# Patient Record
Sex: Male | Born: 1965
Health system: Southern US, Community
[De-identification: ages and names within clinical notes are randomized; demographics above are authoritative.]

## PROBLEM LIST (undated history)

## (undated) DIAGNOSIS — N400 Enlarged prostate without lower urinary tract symptoms: Secondary | ICD-10-CM

## (undated) DIAGNOSIS — N200 Calculus of kidney: Secondary | ICD-10-CM

## (undated) DIAGNOSIS — T7840XA Allergy, unspecified, initial encounter: Secondary | ICD-10-CM

## (undated) DIAGNOSIS — J45909 Unspecified asthma, uncomplicated: Secondary | ICD-10-CM

## (undated) DIAGNOSIS — M1990 Unspecified osteoarthritis, unspecified site: Secondary | ICD-10-CM

## (undated) DIAGNOSIS — K219 Gastro-esophageal reflux disease without esophagitis: Secondary | ICD-10-CM

## (undated) DIAGNOSIS — I10 Essential (primary) hypertension: Secondary | ICD-10-CM

## (undated) DIAGNOSIS — E785 Hyperlipidemia, unspecified: Secondary | ICD-10-CM

## (undated) DIAGNOSIS — M722 Plantar fascial fibromatosis: Secondary | ICD-10-CM

## (undated) HISTORY — DX: Gastro-esophageal reflux disease without esophagitis: K21.9

## (undated) HISTORY — DX: Allergy, unspecified, initial encounter: T78.40XA

## (undated) HISTORY — PX: COLONOSCOPY: SHX174

## (undated) HISTORY — DX: Benign prostatic hyperplasia without lower urinary tract symptoms: N40.0

## (undated) HISTORY — PX: OTHER SURGICAL HISTORY: SHX169

## (undated) HISTORY — PX: TONSILLECTOMY: SUR1361

## (undated) HISTORY — PX: JOINT REPLACEMENT: SHX530

## (undated) HISTORY — DX: Hyperlipidemia, unspecified: E78.5

## (undated) HISTORY — DX: Essential (primary) hypertension: I10

## (undated) HISTORY — PX: EYE SURGERY: SHX253

## (undated) HISTORY — DX: Plantar fascial fibromatosis: M72.2

## (undated) HISTORY — DX: Unspecified asthma, uncomplicated: J45.909

---

## 1998-02-21 ENCOUNTER — Emergency Department (HOSPITAL_COMMUNITY): Admission: EM | Admit: 1998-02-21 | Discharge: 1998-02-21 | Payer: Self-pay | Admitting: Emergency Medicine

## 1999-07-25 ENCOUNTER — Emergency Department (HOSPITAL_COMMUNITY): Admission: EM | Admit: 1999-07-25 | Discharge: 1999-07-25 | Payer: Self-pay | Admitting: Emergency Medicine

## 2000-01-18 ENCOUNTER — Emergency Department (HOSPITAL_COMMUNITY): Admission: EM | Admit: 2000-01-18 | Discharge: 2000-01-18 | Payer: Self-pay | Admitting: Emergency Medicine

## 2000-03-10 ENCOUNTER — Emergency Department (HOSPITAL_COMMUNITY): Admission: EM | Admit: 2000-03-10 | Discharge: 2000-03-10 | Payer: Self-pay | Admitting: *Deleted

## 2000-06-22 ENCOUNTER — Encounter: Payer: Self-pay | Admitting: Internal Medicine

## 2000-06-22 ENCOUNTER — Ambulatory Visit (HOSPITAL_COMMUNITY): Admission: RE | Admit: 2000-06-22 | Discharge: 2000-06-22 | Payer: Self-pay | Admitting: Internal Medicine

## 2000-08-04 ENCOUNTER — Ambulatory Visit (HOSPITAL_COMMUNITY): Admission: RE | Admit: 2000-08-04 | Discharge: 2000-08-04 | Payer: Self-pay | Admitting: Neurosurgery

## 2000-08-04 ENCOUNTER — Encounter: Payer: Self-pay | Admitting: Neurosurgery

## 2000-08-18 ENCOUNTER — Ambulatory Visit (HOSPITAL_COMMUNITY): Admission: RE | Admit: 2000-08-18 | Discharge: 2000-08-18 | Payer: Self-pay | Admitting: Neurosurgery

## 2000-08-18 ENCOUNTER — Encounter: Payer: Self-pay | Admitting: Neurosurgery

## 2000-08-31 ENCOUNTER — Encounter: Payer: Self-pay | Admitting: Neurosurgery

## 2000-08-31 ENCOUNTER — Ambulatory Visit (HOSPITAL_COMMUNITY): Admission: RE | Admit: 2000-08-31 | Discharge: 2000-08-31 | Payer: Self-pay | Admitting: Neurosurgery

## 2000-10-24 ENCOUNTER — Ambulatory Visit (HOSPITAL_COMMUNITY): Admission: RE | Admit: 2000-10-24 | Discharge: 2000-10-24 | Payer: Self-pay | Admitting: Internal Medicine

## 2000-10-24 ENCOUNTER — Encounter: Payer: Self-pay | Admitting: Internal Medicine

## 2003-04-15 ENCOUNTER — Emergency Department (HOSPITAL_COMMUNITY): Admission: EM | Admit: 2003-04-15 | Discharge: 2003-04-15 | Payer: Self-pay | Admitting: Emergency Medicine

## 2004-06-25 ENCOUNTER — Ambulatory Visit: Payer: Self-pay | Admitting: Internal Medicine

## 2004-07-01 ENCOUNTER — Ambulatory Visit: Payer: Self-pay | Admitting: Internal Medicine

## 2004-08-05 ENCOUNTER — Ambulatory Visit: Payer: Self-pay

## 2004-08-05 ENCOUNTER — Ambulatory Visit: Payer: Self-pay | Admitting: Internal Medicine

## 2004-08-12 ENCOUNTER — Ambulatory Visit: Payer: Self-pay | Admitting: Internal Medicine

## 2004-11-05 ENCOUNTER — Ambulatory Visit: Payer: Self-pay | Admitting: Internal Medicine

## 2005-01-06 ENCOUNTER — Ambulatory Visit: Payer: Self-pay | Admitting: Internal Medicine

## 2005-04-26 ENCOUNTER — Ambulatory Visit: Payer: Self-pay | Admitting: Internal Medicine

## 2005-05-03 ENCOUNTER — Ambulatory Visit: Payer: Self-pay | Admitting: Internal Medicine

## 2005-10-24 ENCOUNTER — Ambulatory Visit: Payer: Self-pay | Admitting: Internal Medicine

## 2005-11-01 ENCOUNTER — Ambulatory Visit: Payer: Self-pay | Admitting: Internal Medicine

## 2006-05-08 ENCOUNTER — Ambulatory Visit: Payer: Self-pay | Admitting: Internal Medicine

## 2006-05-08 LAB — CONVERTED CEMR LAB
ALT: 31 units/L (ref 0–40)
Basophils Absolute: 0 10*3/uL (ref 0.0–0.1)
Chloride: 107 meq/L (ref 96–112)
Creatinine, Ser: 1 mg/dL (ref 0.4–1.5)
GFR calc non Af Amer: 88 mL/min
Glomerular Filtration Rate, Af Am: 106 mL/min/{1.73_m2}
Glucose, Bld: 82 mg/dL (ref 70–99)
HDL: 32.7 mg/dL — ABNORMAL LOW (ref >39.0)
LDL Cholesterol: 79 mg/dL (ref 0–99)
MCHC: 34.1 g/dL (ref 30.0–36.0)
MCV: 88.8 fL (ref 78.0–100.0)
Monocytes Absolute: 0.4 10*3/uL (ref 0.2–0.7)
Neutro Abs: 2.5 10*3/uL (ref 1.4–7.7)
Platelets: 211 10*3/uL (ref 150–400)
RBC: 4.84 M/uL (ref 4.22–5.81)
Sodium: 143 meq/L (ref 135–145)
VLDL: 14 mg/dL (ref 0–40)
WBC: 4.6 10*3/uL (ref 4.5–10.5)

## 2006-05-15 ENCOUNTER — Ambulatory Visit: Payer: Self-pay | Admitting: Internal Medicine

## 2006-08-15 ENCOUNTER — Ambulatory Visit: Payer: Self-pay | Admitting: Internal Medicine

## 2006-11-08 ENCOUNTER — Ambulatory Visit: Payer: Self-pay | Admitting: Internal Medicine

## 2006-11-08 LAB — CONVERTED CEMR LAB
AST: 24 units/L (ref 0–37)
Albumin: 4.3 g/dL (ref 3.5–5.2)
Alkaline Phosphatase: 56 units/L (ref 39–117)
Total Bilirubin: 1 mg/dL (ref 0.3–1.2)

## 2006-11-15 ENCOUNTER — Ambulatory Visit: Payer: Self-pay | Admitting: Internal Medicine

## 2007-02-19 ENCOUNTER — Ambulatory Visit: Payer: Self-pay | Admitting: Internal Medicine

## 2007-02-19 LAB — CONVERTED CEMR LAB
ALT: 31 units/L (ref 0–53)
AST: 21 units/L (ref 0–37)
Albumin: 4.1 g/dL (ref 3.5–5.2)
Alkaline Phosphatase: 62 units/L (ref 39–117)
BUN: 18 mg/dL (ref 6–23)
Basophils Absolute: 0 10*3/uL (ref 0.0–0.1)
Calcium: 9.1 mg/dL (ref 8.4–10.5)
Chloride: 110 meq/L (ref 96–112)
Cholesterol: 117 mg/dL (ref 0–200)
Creatinine, Ser: 0.8 mg/dL (ref 0.4–1.5)
GFR calc non Af Amer: 113 mL/min
HCT: 38.9 % — ABNORMAL LOW (ref 39.0–52.0)
LDL Cholesterol: 83 mg/dL (ref 0–99)
MCHC: 35.2 g/dL (ref 30.0–36.0)
Neutrophils Relative %: 50.1 % (ref 43.0–77.0)
Platelets: 196 10*3/uL (ref 150–400)
Protein, U semiquant: NEGATIVE
RBC: 4.43 M/uL (ref 4.22–5.81)
RDW: 12.3 % (ref 11.5–14.6)
Sodium: 144 meq/L (ref 135–145)
Total Bilirubin: 0.9 mg/dL (ref 0.3–1.2)
Total CHOL/HDL Ratio: 5.3
Triglycerides: 62 mg/dL (ref 0–149)
Urobilinogen, UA: 0.2
WBC Urine, dipstick: NEGATIVE
WBC: 4.7 10*3/uL (ref 4.5–10.5)

## 2007-02-26 ENCOUNTER — Ambulatory Visit: Payer: Self-pay | Admitting: Internal Medicine

## 2007-02-26 DIAGNOSIS — E785 Hyperlipidemia, unspecified: Secondary | ICD-10-CM | POA: Insufficient documentation

## 2007-02-26 DIAGNOSIS — I10 Essential (primary) hypertension: Secondary | ICD-10-CM

## 2007-02-26 DIAGNOSIS — G4700 Insomnia, unspecified: Secondary | ICD-10-CM

## 2007-02-26 DIAGNOSIS — M1990 Unspecified osteoarthritis, unspecified site: Secondary | ICD-10-CM

## 2007-06-05 ENCOUNTER — Telehealth: Payer: Self-pay | Admitting: Internal Medicine

## 2007-06-06 ENCOUNTER — Ambulatory Visit: Payer: Self-pay | Admitting: Family Medicine

## 2007-06-06 DIAGNOSIS — R079 Chest pain, unspecified: Secondary | ICD-10-CM

## 2007-06-11 ENCOUNTER — Encounter: Payer: Self-pay | Admitting: Family Medicine

## 2007-06-12 ENCOUNTER — Ambulatory Visit: Payer: Self-pay | Admitting: Internal Medicine

## 2007-06-15 LAB — CONVERTED CEMR LAB
Albumin: 4.3 g/dL (ref 3.5–5.2)
Alkaline Phosphatase: 58 units/L (ref 39–117)
LDL Cholesterol: 80 mg/dL (ref 0–99)
Total CHOL/HDL Ratio: 5.3
Triglycerides: 62 mg/dL (ref 0–149)
VLDL: 12 mg/dL (ref 0–40)

## 2007-06-18 ENCOUNTER — Ambulatory Visit: Payer: Self-pay | Admitting: Internal Medicine

## 2007-06-18 DIAGNOSIS — K219 Gastro-esophageal reflux disease without esophagitis: Secondary | ICD-10-CM

## 2007-06-18 LAB — CONVERTED CEMR LAB
Cholesterol, target level: 200 mg/dL
HDL goal, serum: 40 mg/dL
LDL Goal: 130 mg/dL

## 2007-09-25 ENCOUNTER — Ambulatory Visit: Payer: Self-pay | Admitting: Internal Medicine

## 2007-09-25 DIAGNOSIS — T887XXA Unspecified adverse effect of drug or medicament, initial encounter: Secondary | ICD-10-CM | POA: Insufficient documentation

## 2007-09-25 LAB — CONVERTED CEMR LAB
ALT: 34 units/L (ref 0–53)
AST: 25 units/L (ref 0–37)
Albumin: 4 g/dL (ref 3.5–5.2)
Alkaline Phosphatase: 57 units/L (ref 39–117)
HDL: 25.3 mg/dL — ABNORMAL LOW (ref >39.0)
Triglycerides: 62 mg/dL (ref 0–149)

## 2007-10-01 ENCOUNTER — Ambulatory Visit: Payer: Self-pay | Admitting: Internal Medicine

## 2007-10-15 ENCOUNTER — Ambulatory Visit: Payer: Self-pay | Admitting: Internal Medicine

## 2007-10-15 DIAGNOSIS — M549 Dorsalgia, unspecified: Secondary | ICD-10-CM | POA: Insufficient documentation

## 2008-02-22 ENCOUNTER — Ambulatory Visit: Payer: Self-pay | Admitting: Internal Medicine

## 2008-02-22 LAB — CONVERTED CEMR LAB
ALT: 27 units/L (ref 0–53)
AST: 21 units/L (ref 0–37)
Albumin: 4.2 g/dL (ref 3.5–5.2)
Alkaline Phosphatase: 55 units/L (ref 39–117)
BUN: 16 mg/dL (ref 6–23)
Basophils Relative: 0.5 % (ref 0.0–3.0)
Blood in Urine, dipstick: NEGATIVE
CO2: 30 meq/L (ref 19–32)
Chloride: 110 meq/L (ref 96–112)
Creatinine, Ser: 0.9 mg/dL (ref 0.4–1.5)
Eosinophils Relative: 1.9 % (ref 0.0–5.0)
GFR calc non Af Amer: 98 mL/min
Glucose, Bld: 86 mg/dL (ref 70–99)
Glucose, Urine, Semiquant: NEGATIVE
HDL: 30.3 mg/dL — ABNORMAL LOW (ref >39.0)
MCV: 88.6 fL (ref 78.0–100.0)
Monocytes Relative: 6.1 % (ref 3.0–12.0)
Neutrophils Relative %: 57.4 % (ref 43.0–77.0)
Nitrite: NEGATIVE
Platelets: 198 10*3/uL (ref 150–400)
Potassium: 3.8 meq/L (ref 3.5–5.1)
Protein, U semiquant: NEGATIVE
RBC: 4.77 M/uL (ref 4.22–5.81)
Specific Gravity, Urine: 1.025
TSH: 1.26 microintl units/mL (ref 0.35–5.50)
Total CHOL/HDL Ratio: 3.5
Total Protein: 6.6 g/dL (ref 6.0–8.3)
VLDL: 10 mg/dL (ref 0–40)
WBC Urine, dipstick: NEGATIVE
WBC: 4.3 10*3/uL — ABNORMAL LOW (ref 4.5–10.5)
pH: 6

## 2008-02-29 ENCOUNTER — Telehealth: Payer: Self-pay | Admitting: Internal Medicine

## 2008-03-03 ENCOUNTER — Ambulatory Visit: Payer: Self-pay | Admitting: Internal Medicine

## 2008-04-07 ENCOUNTER — Ambulatory Visit: Payer: Self-pay | Admitting: Internal Medicine

## 2008-04-25 ENCOUNTER — Ambulatory Visit: Payer: Self-pay | Admitting: Internal Medicine

## 2008-04-25 LAB — CONVERTED CEMR LAB: OCCULT 3: NEGATIVE

## 2008-07-28 ENCOUNTER — Ambulatory Visit: Payer: Self-pay | Admitting: Internal Medicine

## 2008-07-28 LAB — CONVERTED CEMR LAB
ALT: 28 units/L (ref 0–53)
HDL: 26.4 mg/dL — ABNORMAL LOW (ref >39.0)
LDL Cholesterol: 79 mg/dL (ref 0–99)
Total Bilirubin: 0.8 mg/dL (ref 0.3–1.2)
VLDL: 7 mg/dL (ref 0–40)

## 2008-08-04 ENCOUNTER — Ambulatory Visit: Payer: Self-pay | Admitting: Internal Medicine

## 2008-08-04 DIAGNOSIS — M25579 Pain in unspecified ankle and joints of unspecified foot: Secondary | ICD-10-CM | POA: Insufficient documentation

## 2009-02-25 ENCOUNTER — Ambulatory Visit: Payer: Self-pay | Admitting: Internal Medicine

## 2009-02-25 DIAGNOSIS — M5126 Other intervertebral disc displacement, lumbar region: Secondary | ICD-10-CM

## 2009-03-03 ENCOUNTER — Encounter: Admission: RE | Admit: 2009-03-03 | Discharge: 2009-03-03 | Payer: Self-pay | Admitting: Internal Medicine

## 2009-03-17 ENCOUNTER — Encounter (INDEPENDENT_AMBULATORY_CARE_PROVIDER_SITE_OTHER): Payer: Self-pay | Admitting: *Deleted

## 2009-04-09 ENCOUNTER — Ambulatory Visit: Payer: Self-pay | Admitting: Internal Medicine

## 2009-04-09 LAB — CONVERTED CEMR LAB
ALT: 28 units/L (ref 0–53)
BUN: 18 mg/dL (ref 6–23)
Basophils Absolute: 0 10*3/uL (ref 0.0–0.1)
Bilirubin Urine: NEGATIVE
Chloride: 106 meq/L (ref 96–112)
Cholesterol: 112 mg/dL (ref 0–200)
Eosinophils Absolute: 0.1 10*3/uL (ref 0.0–0.7)
Glucose, Bld: 91 mg/dL (ref 70–99)
Glucose, Urine, Semiquant: NEGATIVE
HCT: 45 % (ref 39.0–52.0)
Lymphs Abs: 1.6 10*3/uL (ref 0.7–4.0)
MCV: 89.3 fL (ref 78.0–100.0)
Monocytes Absolute: 0.4 10*3/uL (ref 0.1–1.0)
Platelets: 211 10*3/uL (ref 150.0–400.0)
Potassium: 4.1 meq/L (ref 3.5–5.1)
RDW: 11.9 % (ref 11.5–14.6)
TSH: 1.02 microintl units/mL (ref 0.35–5.50)
Total Bilirubin: 1.1 mg/dL (ref 0.3–1.2)
Triglycerides: 55 mg/dL (ref 0.0–149.0)
VLDL: 11 mg/dL (ref 0.0–40.0)
pH: 6

## 2009-04-27 ENCOUNTER — Ambulatory Visit: Payer: Self-pay | Admitting: Internal Medicine

## 2009-07-08 ENCOUNTER — Ambulatory Visit: Payer: Self-pay | Admitting: Internal Medicine

## 2009-07-08 DIAGNOSIS — D171 Benign lipomatous neoplasm of skin and subcutaneous tissue of trunk: Secondary | ICD-10-CM | POA: Insufficient documentation

## 2010-02-02 ENCOUNTER — Encounter: Payer: Self-pay | Admitting: Internal Medicine

## 2010-07-06 NOTE — Assessment & Plan Note (Signed)
Summary: cyst removal/njr   Vital Signs:  Patient profile:   45 year old male Height:      72 inches Weight:      248 pounds BMI:     33.76 Temp:     98.3 degrees F oral Pulse rate:   72 / minute Resp:     14 per minute BP sitting:   124 / 80  (left arm)  Vitals Entered By: Willy Eddy, LPN (July 08, 2009 10:48 AM) CC: cyst removal on back   CC:  cyst removal on back.  History of Present Illness: painfull cyst on mid bacl clincally a lipoma at a pressure point causing pain the skin was no erythematous and the mass was mobile he has ha hx of prior sebacous cysts   Preventive Screening-Counseling & Management  Alcohol-Tobacco     Smoking Status: never  Current Problems (verified): 1)  Herniated Lumbar Disk With Radiculopathy  (ICD-722.10) 2)  Pain in Joint, Ankle and Foot  (ICD-719.47) 3)  Back Pain  (ICD-724.5) 4)  Uns Advrs Eff Uns Rx Medicinal&biological Sbstnc  (ICD-995.20) 5)  Gerd  (ICD-530.81) 6)  Chest Pain  (ICD-786.50) 7)  Insomnia, Persistent  (ICD-307.42) 8)  Osteoarthritis  (ICD-715.90) 9)  Hypertension  (ICD-401.9) 10)  Hyperlipidemia  (ICD-272.4) 11)  Physical Examination  (ICD-V70.0)  Current Medications (verified): 1)  Folic Acid 1 Mg  Tabs (Folic Acid) .... Once Daily 2)  Hyzaar 50-12.5 Mg  Tabs (Losartan Potassium-Hctz) .... Once Daily 3)  Adult Aspirin Ec Low Strength 81 Mg  Tbec (Aspirin) .... Once Daily 4)  Advicor 1000-20 Mg  Tb24 (Niacin-Lovastatin) .... One By Mouth Daily 5)  Lovaza 1 Gm  Caps (Omega-3-Acid Ethyl Esters) .... 4 By Mouth Daily 6)  Multivitamins   Tabs (Multiple Vitamin) .... Once Daily 7)  Bl Vitamin B-6 50 Mg  Tabs (Pyridoxine Hcl) .... Once Daily 8)  Aciphex 20 Mg  Tbec (Rabeprazole Sodium) .... One By Mouth Daily As Directed 9)  Valium 10 Mg Tabs (Diazepam) .Marland Kitchen.. 1 Three Times A Day As Needed Spasm 10)  Vicodin 5-500 Mg Tabs (Hydrocodone-Acetaminophen) .... As Needed 11)  Percocet 5-325 Mg Tabs  (Oxycodone-Acetaminophen) .... As Needed  Allergies (verified): No Known Drug Allergies  Physical Exam  General:  alert, well-developed, and overweight-appearing.   Neck:  supple and full ROM.   Lungs:  normal respiratory effort and no wheezes.   Skin:  3cm lipoma removed 1.2 cm cyst uder the lipoma that was full of sebam Cervical Nodes:  No lymphadenopathy noted Axillary Nodes:  No palpable lymphadenopathy   Impression & Recommendations:  Problem # 1:  LIPOMA OF UNSPECIFIED SITE (ICD-214.9) the lipoma was 3x2 cm in an encapsulated fatty cyst one mid back between shoulder blades surgical removal and identificationj of cyst underneath the cyst was a 1.2 cm sebacious cyst surgial  removalof cysts, lavage with  saline, cloase with 5  4-0 sutures and dress dressing and woundcare taught  Orders: No Charge Patient Arrived (NCPA0) (NCPA0) Surgical Suture Tray [phone]) EMR Misc Charge Code (EMRMisc)  Problem # 2:  SEBACEOUS CYST, BACK (ICD-706.2)  under the lipoma was  discoved a 1.2 cm sebacious cyst this was excized prior to the would  closure  Orders: I&D Abscess, Simple / Single (10060)  Complete Medication List: 1)  Folic Acid 1 Mg Tabs (Folic acid) .... Once daily 2)  Hyzaar 50-12.5 Mg Tabs (Losartan potassium-hctz) .... Once daily 3)  Adult Aspirin Ec Low Strength 81  Mg Tbec (Aspirin) .... Once daily 4)  Advicor 1000-20 Mg Tb24 (Niacin-lovastatin) .... One by mouth daily 5)  Lovaza 1 Gm Caps (Omega-3-acid ethyl esters) .... 4 by mouth daily 6)  Multivitamins Tabs (Multiple vitamin) .... Once daily 7)  Bl Vitamin B-6 50 Mg Tabs (Pyridoxine hcl) .... Once daily 8)  Aciphex 20 Mg Tbec (Rabeprazole sodium) .... One by mouth daily as directed 9)  Valium 10 Mg Tabs (Diazepam) .Marland Kitchen.. 1 three times a day as needed spasm 10)  Vicodin 5-500 Mg Tabs (Hydrocodone-acetaminophen) .... As needed 11)  Percocet 5-325 Mg Tabs (Oxycodone-acetaminophen) .... As needed  Patient  Instructions: 1)  suture remoavel 7-10 days 2)  wound care discussed

## 2010-07-06 NOTE — Letter (Signed)
Summary: [facility]  [facility]   Imported By: Maryln Gottron 03/01/2010 12:12:10  _____________________________________________________________________  External Attachment:    Type:   Image     Comment:   External Document

## 2010-08-17 ENCOUNTER — Other Ambulatory Visit: Payer: Self-pay | Admitting: *Deleted

## 2010-08-17 DIAGNOSIS — E785 Hyperlipidemia, unspecified: Secondary | ICD-10-CM

## 2010-08-17 MED ORDER — NIACIN-LOVASTATIN ER 1000-20 MG PO TB24: 1.0000 | ORAL_TABLET | Freq: Every day | ORAL | Status: DC

## 2010-12-31 ENCOUNTER — Other Ambulatory Visit: Payer: Self-pay | Admitting: Internal Medicine

## 2011-01-07 ENCOUNTER — Telehealth: Payer: Self-pay | Admitting: *Deleted

## 2011-01-07 MED ORDER — DIAZEPAM 10 MG PO TABS: 10.0000 mg | ORAL_TABLET | Freq: Three times a day (TID) | ORAL | Status: AC | PRN

## 2011-01-07 MED ORDER — INDOMETHACIN 50 MG PO CAPS: 50.0000 mg | ORAL_CAPSULE | Freq: Three times a day (TID) | ORAL | Status: AC

## 2011-01-07 NOTE — Telephone Encounter (Signed)
Per dr Lovell Sheehan may have valium 10 tid prn #30 and indocin 50 tid prn #30-no refill

## 2011-01-07 NOTE — Telephone Encounter (Signed)
Pt is having what he calls a recurrent back pain episode, and states Dr. Lovell Sheehan usually prescribes Diazepam 10 mg, and an antiinflammatory . [place].

## 2011-03-03 ENCOUNTER — Other Ambulatory Visit (INDEPENDENT_AMBULATORY_CARE_PROVIDER_SITE_OTHER): Payer: 59

## 2011-03-03 DIAGNOSIS — Z0000 Encounter for general adult medical examination without abnormal findings: Secondary | ICD-10-CM

## 2011-03-03 LAB — CBC WITH DIFFERENTIAL/PLATELET
Basophils Absolute: 0 10*3/uL (ref 0.0–0.1)
Basophils Relative: 0.5 % (ref 0.0–3.0)
Eosinophils Absolute: 0.1 10*3/uL (ref 0.0–0.7)
Lymphocytes Relative: 29.7 % (ref 12.0–46.0)
MCHC: 32.8 g/dL (ref 30.0–36.0)
MCV: 89.2 fl (ref 78.0–100.0)
Monocytes Absolute: 0.3 10*3/uL (ref 0.1–1.0)
Neutrophils Relative %: 61.7 % (ref 43.0–77.0)
Platelets: 199 10*3/uL (ref 150.0–400.0)
RDW: 13.6 % (ref 11.5–14.6)

## 2011-03-03 LAB — POCT URINALYSIS DIPSTICK
Bilirubin, UA: NEGATIVE
Blood, UA: 1.02
Nitrite, UA: NEGATIVE
Protein, UA: NEGATIVE
pH, UA: 7

## 2011-03-04 LAB — BASIC METABOLIC PANEL
BUN: 18 mg/dL (ref 6–23)
CO2: 27 mEq/L (ref 19–32)
Calcium: 9.5 mg/dL (ref 8.4–10.5)
Chloride: 106 mEq/L (ref 96–112)
Creatinine, Ser: 0.9 mg/dL (ref 0.4–1.5)
Glucose, Bld: 85 mg/dL (ref 70–99)

## 2011-03-04 LAB — LIPID PANEL
HDL: 38.4 mg/dL — ABNORMAL LOW (ref >39.00)
Total CHOL/HDL Ratio: 3
Triglycerides: 47 mg/dL (ref 0.0–149.0)
VLDL: 9.4 mg/dL (ref 0.0–40.0)

## 2011-03-04 LAB — HEPATIC FUNCTION PANEL
Bilirubin, Direct: 0.1 mg/dL (ref 0.0–0.3)
Total Bilirubin: 0.8 mg/dL (ref 0.3–1.2)
Total Protein: 6.9 g/dL (ref 6.0–8.3)

## 2011-03-17 ENCOUNTER — Ambulatory Visit (INDEPENDENT_AMBULATORY_CARE_PROVIDER_SITE_OTHER): Payer: 59 | Admitting: Internal Medicine

## 2011-03-17 ENCOUNTER — Encounter: Payer: Self-pay | Admitting: Internal Medicine

## 2011-03-17 DIAGNOSIS — Z8249 Family history of ischemic heart disease and other diseases of the circulatory system: Secondary | ICD-10-CM

## 2011-03-17 DIAGNOSIS — Z0000 Encounter for general adult medical examination without abnormal findings: Secondary | ICD-10-CM

## 2011-03-17 DIAGNOSIS — M549 Dorsalgia, unspecified: Secondary | ICD-10-CM

## 2011-03-17 DIAGNOSIS — E785 Hyperlipidemia, unspecified: Secondary | ICD-10-CM

## 2011-03-17 DIAGNOSIS — I10 Essential (primary) hypertension: Secondary | ICD-10-CM

## 2011-03-17 MED ORDER — ROSUVASTATIN CALCIUM 10 MG PO TABS: 10.0000 mg | ORAL_TABLET | Freq: Every day | ORAL | Status: DC

## 2011-03-17 MED ORDER — INOSITOL NIACINATE 500 MG PO CAPS: 1.0000 | ORAL_CAPSULE | Freq: Two times a day (BID) | ORAL | Status: DC

## 2011-03-17 NOTE — Patient Instructions (Signed)
We are changing your cholesterol medications due to your family history and intolerance of recurrent medication to flush free niacin 500 mg twice daily with food and Crestor 10 mg once daily

## 2011-03-18 ENCOUNTER — Other Ambulatory Visit: Payer: Self-pay | Admitting: Internal Medicine

## 2011-03-18 MED ORDER — ROSUVASTATIN CALCIUM 10 MG PO TABS: 10.0000 mg | ORAL_TABLET | Freq: Every day | ORAL | Status: DC

## 2011-05-10 ENCOUNTER — Other Ambulatory Visit (INDEPENDENT_AMBULATORY_CARE_PROVIDER_SITE_OTHER): Payer: 59

## 2011-05-10 DIAGNOSIS — E785 Hyperlipidemia, unspecified: Secondary | ICD-10-CM

## 2011-05-10 LAB — HEPATIC FUNCTION PANEL
ALT: 51 U/L (ref 0–53)
Total Bilirubin: 0.8 mg/dL (ref 0.3–1.2)
Total Protein: 7 g/dL (ref 6.0–8.3)

## 2011-05-10 LAB — LIPID PANEL: Total CHOL/HDL Ratio: 3

## 2011-05-20 ENCOUNTER — Ambulatory Visit: Payer: 59 | Admitting: Internal Medicine

## 2011-05-23 ENCOUNTER — Encounter: Payer: Self-pay | Admitting: Internal Medicine

## 2011-05-23 ENCOUNTER — Ambulatory Visit (INDEPENDENT_AMBULATORY_CARE_PROVIDER_SITE_OTHER): Payer: 59 | Admitting: Internal Medicine

## 2011-05-23 DIAGNOSIS — E785 Hyperlipidemia, unspecified: Secondary | ICD-10-CM

## 2011-05-23 NOTE — Patient Instructions (Signed)
The patient is instructed to continue all medications as prescribed. Schedule followup with check out clerk upon leaving the clinic  

## 2011-05-23 NOTE — Progress Notes (Signed)
  Subjective:    Patient ID: Andrew Wilkins, male    DOB: 03/19/1966, 45 y.o.   MRN: [id number]  HPI  Follow up of lipid testing  Review of Systems  Constitutional: Negative for fever and fatigue.  HENT: Negative for hearing loss, congestion, neck pain and postnasal drip.   Eyes: Negative for discharge, redness and visual disturbance.  Respiratory: Negative for cough, shortness of breath and wheezing.   Cardiovascular: Negative for leg swelling.  Gastrointestinal: Negative for abdominal pain, constipation and abdominal distention.  Genitourinary: Negative for urgency and frequency.  Musculoskeletal: Negative for joint swelling and arthralgias.  Skin: Negative for color change and rash.  Neurological: Negative for weakness and light-headedness.  Hematological: Negative for adenopathy.  Psychiatric/Behavioral: Negative for behavioral problems.       Objective:   Physical Exam  Nursing note and vitals reviewed. Constitutional: He appears well-developed and well-nourished.  HENT:  Head: Normocephalic and atraumatic.  Eyes: Conjunctivae are normal. Pupils are equal, round, and reactive to light.  Neck: Normal range of motion. Neck supple.  Cardiovascular: Normal rate and regular rhythm.   Pulmonary/Chest: Effort normal and breath sounds normal.  Abdominal: Soft. Bowel sounds are normal.          Assessment & Plan:  We had changed his statin due to her increased cardiovascular risk factors to Crestor 20 mg daily he continues on the omega-3 fatty acids and the niacin.  He had an excellent response with a total cholesterol well below 100 and HDL cholesterol of 36 and LDL cholesterol in the 60s.  This is an ideal profile for him for his risks  Other risk reduction should include weight loss Followup in 4 months time

## 2011-05-23 NOTE — Progress Notes (Signed)
Subjective:    Patient ID: Andrew Wilkins, male    DOB: 10-Mar-1966, 45 y.o.   MRN: [id number]  HPI  Patient is a 45 year old white male presents for complete physical examination.  He recently had his family history changed in that his brother developed coronary disease at 63 early age.  There is also a strong family history in uncles and father he has never smoked but he does have obesity hypertension and hyperlipidemia as risk factors.    Review of Systems  Constitutional: Negative for fever and fatigue.  HENT: Negative for hearing loss, congestion, neck pain and postnasal drip.   Eyes: Negative for discharge, redness and visual disturbance.  Respiratory: Negative for cough, shortness of breath and wheezing.   Cardiovascular: Negative for leg swelling.  Gastrointestinal: Negative for abdominal pain, constipation and abdominal distention.  Genitourinary: Negative for urgency and frequency.  Musculoskeletal: Negative for joint swelling and arthralgias.  Skin: Negative for color change and rash.  Neurological: Negative for weakness and light-headedness.  Hematological: Negative for adenopathy.  Psychiatric/Behavioral: Negative for behavioral problems.       Past Medical History  Diagnosis Date  . Hyperlipidemia   . GERD (gastroesophageal reflux disease)   . Hypertension   . BPH (benign prostatic hyperplasia)     History   Social History  . Marital Status: Married    Spouse Name: N/A    Number of Children: N/A  . Years of Education: N/A   Occupational History  . Not on file.   Social History Main Topics  . Smoking status: Never Smoker   . Smokeless tobacco: Not on file  . Alcohol Use: Not on file  . Drug Use: Not on file  . Sexually Active: Yes   Other Topics Concern  . Not on file   Social History Narrative  . No narrative on file    History reviewed. No pertinent past surgical history.  Family History  Problem Relation Age of Onset  . Cancer Father      lung  . Heart disease Father 46    PAD and CAD  . Heart disease Sister 46    mi  . Stroke Daughter     No Known Allergies  Current Outpatient Prescriptions on File Prior to Visit  Medication Sig Dispense Refill  . ACIPHEX 20 MG tablet TAKE 1 TABLET DAILY AS DIRECTED  90 tablet  2  . losartan-hydrochlorothiazide (HYZAAR) 50-12.5 MG per tablet TAKE 1 TABLET DAILY  90 tablet  2    BP 140/80  Pulse 76  Temp 98.2 F (36.8 C)  Resp 16  Ht 6\' 2"  (1.88 m)  Wt 251 lb (113.853 kg)  BMI 32.23 kg/m2    Objective:   Physical Exam  Vitals reviewed. Constitutional: He is oriented to person, place, and time. He appears well-developed and well-nourished.  HENT:  Head: Normocephalic and atraumatic.  Eyes: Conjunctivae are normal. Pupils are equal, round, and reactive to light.  Neck: Normal range of motion. Neck supple.  Cardiovascular: Normal rate and regular rhythm.   Pulmonary/Chest: Effort normal and breath sounds normal.  Abdominal: Soft. Normal aorta and bowel sounds are normal.  Genitourinary: Prostate normal and penis normal.  Musculoskeletal: Normal range of motion.  Neurological: He is alert and oriented to person, place, and time.  Skin: Skin is warm and dry.  Psychiatric: He has a normal mood and affect. His behavior is normal.          Assessment & Plan:  Patient presents for yearly preventative medicine examination.   all immunizations and health maintenance protocols were reviewed with the patient and they are up to date with these protocols.   screening laboratory values were reviewed with the patient including screening of hyperlipidemia PSA renal function and hepatic function.   There medications past medical history social history problem list and allergies were reviewed in detail.   Goals were established with regard to weight loss exercise diet in compliance with medications  Long discussion about risk stratification for this patient with multiple  risk factors including strong family history of obesity hypertension and hyperlipidemia.  We set a goal to get his total cholesterol below 150 his HDL 40 or above his LDL cholesterol 70 or below the 2 severe risks.  We will add Crestor 10 mg by mouth daily and keep him on the visual and the niacin.  He will followup in 2 months time

## 2011-08-15 ENCOUNTER — Telehealth: Payer: Self-pay | Admitting: Internal Medicine

## 2011-08-15 MED ORDER — FLUTICASONE PROPIONATE 50 MCG/ACT NA SUSP: 2.0000 | Freq: Every day | NASAL | Status: DC

## 2011-08-15 NOTE — Telephone Encounter (Signed)
Patient called stating that he need a refill on his flonase sent to [facility]. Please assist.

## 2011-08-15 NOTE — Telephone Encounter (Signed)
Done

## 2011-09-08 ENCOUNTER — Other Ambulatory Visit (INDEPENDENT_AMBULATORY_CARE_PROVIDER_SITE_OTHER): Payer: 59

## 2011-09-08 DIAGNOSIS — E785 Hyperlipidemia, unspecified: Secondary | ICD-10-CM

## 2011-09-08 DIAGNOSIS — T887XXA Unspecified adverse effect of drug or medicament, initial encounter: Secondary | ICD-10-CM

## 2011-09-08 LAB — HEPATIC FUNCTION PANEL
AST: 25 U/L (ref 0–37)
Alkaline Phosphatase: 65 U/L (ref 39–117)
Bilirubin, Direct: 0.1 mg/dL (ref 0.0–0.3)
Total Bilirubin: 0.6 mg/dL (ref 0.3–1.2)

## 2011-09-08 LAB — LIPID PANEL
Cholesterol: 94 mg/dL (ref 0–200)
HDL: 35.7 mg/dL — ABNORMAL LOW (ref >39.00)
Total CHOL/HDL Ratio: 3
Triglycerides: 63 mg/dL (ref 0.0–149.0)

## 2011-09-23 ENCOUNTER — Ambulatory Visit: Payer: 59 | Admitting: Internal Medicine

## 2011-09-26 ENCOUNTER — Ambulatory Visit (INDEPENDENT_AMBULATORY_CARE_PROVIDER_SITE_OTHER): Payer: 59 | Admitting: Internal Medicine

## 2011-09-26 ENCOUNTER — Other Ambulatory Visit: Payer: Self-pay | Admitting: *Deleted

## 2011-09-26 VITALS — BP 130/84 | HR 72 | Temp 98.2°F | Resp 16 | Ht 74.0 in | Wt 258.0 lb

## 2011-09-26 DIAGNOSIS — J45909 Unspecified asthma, uncomplicated: Secondary | ICD-10-CM

## 2011-09-26 DIAGNOSIS — H6980 Other specified disorders of Eustachian tube, unspecified ear: Secondary | ICD-10-CM

## 2011-09-26 DIAGNOSIS — H9319 Tinnitus, unspecified ear: Secondary | ICD-10-CM

## 2011-09-26 DIAGNOSIS — E785 Hyperlipidemia, unspecified: Secondary | ICD-10-CM

## 2011-09-26 MED ORDER — AZELASTINE-FLUTICASONE 137-50 MCG/ACT NA SUSP: 1.0000 | Freq: Two times a day (BID) | NASAL | Status: DC

## 2011-09-26 MED ORDER — CLONAZEPAM 0.5 MG PO TABS: 0.5000 mg | ORAL_TABLET | Freq: Every evening | ORAL | Status: DC | PRN

## 2011-09-26 NOTE — Patient Instructions (Signed)
The patient is instructed to continue all medications as prescribed. Schedule followup with check out clerk upon leaving the clinic  

## 2011-09-26 NOTE — Progress Notes (Signed)
Subjective:    Patient ID: Andrew Wilkins, male    DOB: 1966-04-26, 46 y.o.   MRN: [id number]  HPI Follow up of lipids Acute complaint of persistent tinnitus that has worsened even after discontinuing all nonsteroidals.  He has a history of allergic rhinitis he has noticed some muffled hearing and increased ringing in the ears   Review of Systems  Constitutional: Negative for fever and fatigue.  HENT: Negative for hearing loss, congestion, neck pain and postnasal drip.   Eyes: Negative for discharge, redness and visual disturbance.  Respiratory: Negative for cough, shortness of breath and wheezing.   Cardiovascular: Negative for leg swelling.  Gastrointestinal: Negative for abdominal pain, constipation and abdominal distention.  Genitourinary: Negative for urgency and frequency.  Musculoskeletal: Negative for joint swelling and arthralgias.  Skin: Negative for color change and rash.  Neurological: Negative for weakness and light-headedness.  Hematological: Negative for adenopathy.  Psychiatric/Behavioral: Negative for behavioral problems.   Past Medical History  Diagnosis Date  . Hyperlipidemia   . GERD (gastroesophageal reflux disease)   . Hypertension   . BPH (benign prostatic hyperplasia)     History   Social History  . Marital Status: Married    Spouse Name: N/A    Number of Children: N/A  . Years of Education: N/A   Occupational History  . Not on file.   Social History Main Topics  . Smoking status: Never Smoker   . Smokeless tobacco: Not on file  . Alcohol Use: Not on file  . Drug Use: Not on file  . Sexually Active: Yes   Other Topics Concern  . Not on file   Social History Narrative  . No narrative on file    No past surgical history on file.  Family History  Problem Relation Age of Onset  . Cancer Father     lung  . Heart disease Father 53    PAD and CAD  . Heart disease Sister 79    mi  . Stroke Daughter     No Known  Allergies  Current Outpatient Prescriptions on File Prior to Visit  Medication Sig Dispense Refill  . ACIPHEX 20 MG tablet TAKE 1 TABLET DAILY AS DIRECTED  90 tablet  2  . aspirin 81 MG tablet Take 81 mg by mouth daily.        . Azelastine-Fluticasone 137-50 MCG/ACT SUSP Place 1 spray into the nose 2 (two) times daily.  23 g  11  . clonazePAM (KLONOPIN) 0.5 MG tablet Take 0.5 mg by mouth at bedtime as needed.        . diazepam (VALIUM) 10 MG tablet Take 10 mg by mouth every 8 (eight) hours as needed.        . fish oil-omega-3 fatty acids 1000 MG capsule Take 2 g by mouth daily.        . folic acid (FOLVITE) 1 MG tablet TAKE 1 TABLET DAILY  90 tablet  2  . HYDROcodone-acetaminophen (VICODIN) 5-500 MG per tablet Take 1 tablet by mouth every 6 (six) hours as needed.        . Inositol Niacinate (NIACIN FLUSH FREE) 500 MG CAPS Take 1 capsule (500 mg total) by mouth 2 (two) times daily after a meal.  180 each  0  . losartan-hydrochlorothiazide (HYZAAR) 50-12.5 MG per tablet TAKE 1 TABLET DAILY  90 tablet  2  . rosuvastatin (CRESTOR) 10 MG tablet Take 1 tablet (10 mg total) by mouth daily.  90 tablet  3  . Tamsulosin HCl (FLOMAX) 0.4 MG CAPS Take 0.4 mg by mouth. Prn kidney stones          BP 130/84  Pulse 72  Temp 98.2 F (36.8 C)  Resp 16  Ht 6\' 2"  (1.88 m)  Wt 258 lb (117.028 kg)  BMI 33.13 kg/m2       Objective:   Physical Exam  Nursing note and vitals reviewed. Constitutional: He is oriented to person, place, and time. He appears well-developed and well-nourished.  HENT:  Head: Normocephalic and atraumatic.  Eyes: Conjunctivae are normal. Pupils are equal, round, and reactive to light.  Neck: Normal range of motion. Neck supple.  Cardiovascular: Normal rate and regular rhythm.   Pulmonary/Chest: Effort normal and breath sounds normal.  Abdominal: Soft. Bowel sounds are normal.  Musculoskeletal: Normal range of motion.  Neurological: He is alert and oriented to person, place,  and time.  Skin: Skin is warm and dry.          Assessment & Plan:  Follow up lipids with good result This is a new acute problem of worsening tendinitis we stopped all nonsteroidals and the tinnitus persists on physical examination he has eustachian tube dysfunction with marked stenoses bilaterally.  We will start him on a new nasal corticosteroid Astelin combination and see if this will help with the eustachian tube dysfunction otherwise he'll need to see an ear nose and throat specialist for this

## 2011-10-23 ENCOUNTER — Other Ambulatory Visit: Payer: Self-pay | Admitting: Internal Medicine

## 2011-12-26 ENCOUNTER — Encounter: Payer: Self-pay | Admitting: Internal Medicine

## 2011-12-26 ENCOUNTER — Ambulatory Visit (INDEPENDENT_AMBULATORY_CARE_PROVIDER_SITE_OTHER): Payer: 59 | Admitting: Internal Medicine

## 2011-12-26 VITALS — BP 120/70 | HR 72 | Temp 98.6°F | Resp 16 | Ht 74.0 in | Wt 256.0 lb

## 2011-12-26 DIAGNOSIS — E785 Hyperlipidemia, unspecified: Secondary | ICD-10-CM

## 2011-12-26 DIAGNOSIS — R739 Hyperglycemia, unspecified: Secondary | ICD-10-CM

## 2011-12-26 DIAGNOSIS — R7309 Other abnormal glucose: Secondary | ICD-10-CM

## 2011-12-26 DIAGNOSIS — T887XXA Unspecified adverse effect of drug or medicament, initial encounter: Secondary | ICD-10-CM

## 2011-12-26 LAB — HEPATIC FUNCTION PANEL
ALT: 37 U/L (ref 0–53)
Alkaline Phosphatase: 66 U/L (ref 39–117)
Bilirubin, Direct: 0.1 mg/dL (ref 0.0–0.3)
Total Bilirubin: 0.9 mg/dL (ref 0.3–1.2)
Total Protein: 7.2 g/dL (ref 6.0–8.3)

## 2011-12-26 LAB — LIPID PANEL
HDL: 34.2 mg/dL — ABNORMAL LOW (ref >39.00)
LDL Cholesterol: 46 mg/dL (ref 0–99)
Total CHOL/HDL Ratio: 3

## 2011-12-26 LAB — HEMOGLOBIN A1C: Hgb A1c MFr Bld: 5.7 % (ref 4.6–6.5)

## 2011-12-26 MED ORDER — HYDROCODONE-ACETAMINOPHEN 5-500 MG PO TABS: 1.0000 | ORAL_TABLET | Freq: Four times a day (QID) | ORAL | Status: DC | PRN

## 2011-12-26 MED ORDER — DIAZEPAM 10 MG PO TABS: 10.0000 mg | ORAL_TABLET | Freq: Three times a day (TID) | ORAL | Status: DC | PRN

## 2011-12-26 NOTE — Patient Instructions (Signed)
The patient is instructed to continue all medications as prescribed. Schedule followup with check out clerk upon leaving the clinic  

## 2011-12-26 NOTE — Progress Notes (Signed)
Subjective:    Patient ID: Andrew Wilkins, male    DOB: [birth year], 46 y.o.   MRN: [id number]  HPI  .  Review of Systems  Constitutional: Negative for fever and fatigue.  HENT: Negative for hearing loss, congestion, neck pain and postnasal drip.   Eyes: Negative for discharge, redness and visual disturbance.  Respiratory: Negative for cough, shortness of breath and wheezing.   Cardiovascular: Negative for leg swelling.  Gastrointestinal: Negative for abdominal pain, constipation and abdominal distention.  Genitourinary: Negative for urgency and frequency.  Musculoskeletal: Negative for joint swelling and arthralgias.  Skin: Negative for color change and rash.  Neurological: Negative for weakness and light-headedness.  Hematological: Negative for adenopathy.  Psychiatric/Behavioral: Negative for behavioral problems.       Past Medical History  Diagnosis Date  . Hyperlipidemia   . GERD (gastroesophageal reflux disease)   . Hypertension   . BPH (benign prostatic hyperplasia)     History   Social History  . Marital Status: Married    Spouse Name: N/A    Number of Children: N/A  . Years of Education: N/A   Occupational History  . Not on file.   Social History Main Topics  . Smoking status: Never Smoker   . Smokeless tobacco: Not on file  . Alcohol Use: Not on file  . Drug Use: Not on file  . Sexually Active: Yes   Other Topics Concern  . Not on file   Social History Narrative  . No narrative on file    No past surgical history on file.  Family History  Problem Relation Age of Onset  . Cancer Father     lung  . Heart disease Father 4    PAD and CAD  . Heart disease Sister 52    mi  . Stroke Daughter     No Known Allergies  Current Outpatient Prescriptions on File Prior to Visit  Medication Sig Dispense Refill  . ACIPHEX 20 MG tablet TAKE 1 TABLET DAILY AS DIRECTED  90 tablet  1  . aspirin 81 MG tablet Take 81 mg by mouth daily.        .  Azelastine-Fluticasone 137-50 MCG/ACT SUSP Place 1 spray into the nose 2 (two) times daily.  23 g  11  . clonazePAM (KLONOPIN) 0.5 MG tablet Take 1 tablet (0.5 mg total) by mouth at bedtime as needed.  30 tablet  5  . fish oil-omega-3 fatty acids 1000 MG capsule Take 2 g by mouth daily.        . folic acid (FOLVITE) 1 MG tablet TAKE 1 TABLET DAILY  90 tablet  2  . Inositol Niacinate (NIACIN FLUSH FREE) 500 MG CAPS Take 1 capsule (500 mg total) by mouth 2 (two) times daily after a meal.  180 each  0  . losartan-hydrochlorothiazide (HYZAAR) 50-12.5 MG per tablet TAKE 1 TABLET DAILY  90 tablet  1  . rosuvastatin (CRESTOR) 10 MG tablet Take 1 tablet (10 mg total) by mouth daily.  90 tablet  3  . Tamsulosin HCl (FLOMAX) 0.4 MG CAPS Take 0.4 mg by mouth. Prn kidney stones          BP 120/70  Pulse 72  Temp 98.6 F (37 C)  Resp 16  Ht 6\' 2"  (1.88 m)  Wt 256 lb (116.121 kg)  BMI 32.87 kg/m2    Objective:   Physical Exam  Nursing note and vitals reviewed. Constitutional: He appears well-developed and well-nourished.  HENT:  Head: Normocephalic and atraumatic.  Eyes: Conjunctivae are normal. Pupils are equal, round, and reactive to light.  Neck: Normal range of motion. Neck supple.  Cardiovascular: Normal rate and regular rhythm.   Pulmonary/Chest: Effort normal and breath sounds normal.  Abdominal: Soft. Bowel sounds are normal.          Assessment & Plan:  Is followed for obesity hypertension hyperlipidemia gastroesophageal reflux and significant family history risk for stroke.  His cholesterol has been moderately well controlled in the past he has not had any diabetes but he is struggling with a weight of around 250 pounds with a target weight closer to 190.  He is classified as obese. We discussed a gluten-free diet strategy with him and went over a complete handout on a diet strategy.   I have spent more than 30 minutes examining this patient face-to-face of which over half  was spent in counseling

## 2012-02-03 ENCOUNTER — Other Ambulatory Visit: Payer: Self-pay | Admitting: *Deleted

## 2012-02-03 ENCOUNTER — Other Ambulatory Visit: Payer: Self-pay | Admitting: Internal Medicine

## 2012-02-03 MED ORDER — LOSARTAN POTASSIUM-HCTZ 50-12.5 MG PO TABS: 1.0000 | ORAL_TABLET | Freq: Every day | ORAL | Status: DC

## 2012-03-28 ENCOUNTER — Encounter: Payer: Self-pay | Admitting: Internal Medicine

## 2012-03-28 ENCOUNTER — Ambulatory Visit (INDEPENDENT_AMBULATORY_CARE_PROVIDER_SITE_OTHER): Payer: 59 | Admitting: Internal Medicine

## 2012-03-28 VITALS — BP 110/76 | HR 72 | Temp 98.1°F | Resp 16 | Ht 74.0 in | Wt 258.0 lb

## 2012-03-28 DIAGNOSIS — H9319 Tinnitus, unspecified ear: Secondary | ICD-10-CM

## 2012-03-28 DIAGNOSIS — N400 Enlarged prostate without lower urinary tract symptoms: Secondary | ICD-10-CM

## 2012-03-28 DIAGNOSIS — M25579 Pain in unspecified ankle and joints of unspecified foot: Secondary | ICD-10-CM

## 2012-03-28 DIAGNOSIS — H9190 Unspecified hearing loss, unspecified ear: Secondary | ICD-10-CM

## 2012-03-28 MED ORDER — DOXAZOSIN MESYLATE 2 MG PO TABS: 2.0000 mg | ORAL_TABLET | Freq: Every day | ORAL | Status: DC

## 2012-03-28 NOTE — Patient Instructions (Signed)
Added cardura for BPH and blood pressure

## 2012-03-28 NOTE — Progress Notes (Signed)
Subjective:    Patient ID: Andrew Wilkins, male    DOB: 01/23/1966, 46 y.o.   MRN: [id number]  HPI Follow up Stress reduction Left sided chest wall pain with reproducible tenderness No pleuritic No skin rash tinnitus in ears persistent.... Refer to audiology Elevated BP  Mild urgency and BPH symptoms   Review of Systems  Constitutional: Negative for fever and fatigue.  HENT: Negative for hearing loss, congestion, neck pain and postnasal drip.   Eyes: Negative for discharge, redness and visual disturbance.  Respiratory: Negative for cough, shortness of breath and wheezing.   Cardiovascular: Negative for leg swelling.  Gastrointestinal: Negative for abdominal pain, constipation and abdominal distention.  Genitourinary: Negative for urgency and frequency.  Musculoskeletal: Negative for joint swelling and arthralgias.  Skin: Negative for color change and rash.  Neurological: Negative for weakness and light-headedness.  Hematological: Negative for adenopathy.  Psychiatric/Behavioral: Negative for behavioral problems.   Past Medical History  Diagnosis Date  . Hyperlipidemia   . GERD (gastroesophageal reflux disease)   . Hypertension   . BPH (benign prostatic hyperplasia)     History   Social History  . Marital Status: Married    Spouse Name: N/A    Number of Children: N/A  . Years of Education: N/A   Occupational History  . Not on file.   Social History Main Topics  . Smoking status: Never Smoker   . Smokeless tobacco: Not on file  . Alcohol Use: Not on file  . Drug Use: Not on file  . Sexually Active: Yes   Other Topics Concern  . Not on file   Social History Narrative  . No narrative on file    No past surgical history on file.  Family History  Problem Relation Age of Onset  . Cancer Father     lung  . Heart disease Father 46    PAD and CAD  . Heart disease Sister 12    mi  . Stroke Daughter     No Known Allergies  Current Outpatient  Prescriptions on File Prior to Visit  Medication Sig Dispense Refill  . ACIPHEX 20 MG tablet TAKE 1 TABLET DAILY AS DIRECTED  90 tablet  1  . aspirin 81 MG tablet Take 81 mg by mouth daily.        . Azelastine-Fluticasone 137-50 MCG/ACT SUSP Place 1 spray into the nose 2 (two) times daily.  23 g  11  . clonazePAM (KLONOPIN) 0.5 MG tablet Take 1 tablet (0.5 mg total) by mouth at bedtime as needed.  30 tablet  5  . diazepam (VALIUM) 10 MG tablet Take 1 tablet (10 mg total) by mouth every 8 (eight) hours as needed.  30 tablet  3  . fish oil-omega-3 fatty acids 1000 MG capsule Take 2 g by mouth daily.        . folic acid (FOLVITE) 1 MG tablet TAKE 1 TABLET DAILY  90 tablet  1  . HYDROcodone-acetaminophen (VICODIN) 5-500 MG per tablet Take 1 tablet by mouth every 6 (six) hours as needed.  30 tablet  1  . Inositol Niacinate (NIACIN FLUSH FREE) 500 MG CAPS Take 1 capsule (500 mg total) by mouth 2 (two) times daily after a meal.  180 each  0  . losartan-hydrochlorothiazide (HYZAAR) 50-12.5 MG per tablet Take 1 tablet by mouth daily.  90 tablet  1  . Tamsulosin HCl (FLOMAX) 0.4 MG CAPS Take 0.4 mg by mouth. Prn kidney stones  BP 110/76  Pulse 72  Temp 98.1 F (36.7 C)  Resp 16  Ht 6\' 2"  (1.88 m)  Wt 258 lb (117.028 kg)  BMI 33.13 kg/m2       Objective:   Physical Exam  Vitals reviewed. Constitutional: He is oriented to person, place, and time. He appears well-developed and well-nourished.  HENT:  Head: Normocephalic and atraumatic.  Eyes: Conjunctivae normal are normal. Pupils are equal, round, and reactive to light.  Neck: Normal range of motion. Neck supple.  Cardiovascular: Normal rate and regular rhythm.   Pulmonary/Chest: Effort normal and breath sounds normal.  Abdominal: Soft. Bowel sounds are normal.  Neurological: He is alert and oriented to person, place, and time.  Skin: Skin is warm.          Assessment & Plan:  Audiology referral to hearin gloss and  tinnitis Elevated blood pressure and BPH add cardura

## 2012-05-28 ENCOUNTER — Other Ambulatory Visit: Payer: Self-pay | Admitting: *Deleted

## 2012-05-28 DIAGNOSIS — N400 Enlarged prostate without lower urinary tract symptoms: Secondary | ICD-10-CM

## 2012-05-28 MED ORDER — DOXAZOSIN MESYLATE 2 MG PO TABS: 2.0000 mg | ORAL_TABLET | Freq: Every day | ORAL | Status: DC

## 2012-05-28 MED ORDER — FOLIC ACID 1 MG PO TABS: 1.0000 mg | ORAL_TABLET | Freq: Every day | ORAL | Status: DC

## 2012-05-28 MED ORDER — RABEPRAZOLE SODIUM 20 MG PO TBEC: 20.0000 mg | DELAYED_RELEASE_TABLET | Freq: Every day | ORAL | Status: DC

## 2012-06-01 ENCOUNTER — Other Ambulatory Visit: Payer: Self-pay | Admitting: *Deleted

## 2012-06-01 MED ORDER — LOSARTAN POTASSIUM-HCTZ 50-12.5 MG PO TABS: 1.0000 | ORAL_TABLET | Freq: Every day | ORAL | Status: DC

## 2012-07-21 ENCOUNTER — Other Ambulatory Visit: Payer: Self-pay

## 2012-08-20 ENCOUNTER — Ambulatory Visit (INDEPENDENT_AMBULATORY_CARE_PROVIDER_SITE_OTHER): Payer: 59 | Admitting: Internal Medicine

## 2012-08-20 ENCOUNTER — Encounter: Payer: Self-pay | Admitting: Internal Medicine

## 2012-08-20 VITALS — BP 110/70 | HR 68 | Temp 97.8°F | Wt 260.0 lb

## 2012-08-20 DIAGNOSIS — M549 Dorsalgia, unspecified: Secondary | ICD-10-CM

## 2012-08-20 DIAGNOSIS — E785 Hyperlipidemia, unspecified: Secondary | ICD-10-CM

## 2012-08-20 LAB — HEMOGLOBIN A1C: Hgb A1c MFr Bld: 5.5 % (ref 4.6–6.5)

## 2012-08-20 LAB — LIPID PANEL
HDL: 30.3 mg/dL — ABNORMAL LOW (ref >39.00)
LDL Cholesterol: 51 mg/dL (ref 0–99)
Total CHOL/HDL Ratio: 3
VLDL: 11.6 mg/dL (ref 0.0–40.0)

## 2012-08-20 NOTE — Progress Notes (Signed)
Subjective:    Patient ID: Andrew Wilkins, male    DOB: 1966/03/04, 47 y.o.   MRN: [id number]  HPI Monitoring for weight and lipids   Review of Systems  Constitutional: Negative for fever and fatigue.  HENT: Negative for hearing loss, congestion, neck pain and postnasal drip.   Eyes: Negative for discharge, redness and visual disturbance.  Respiratory: Negative for cough, shortness of breath and wheezing.   Cardiovascular: Negative for leg swelling.  Gastrointestinal: Negative for abdominal pain, constipation and abdominal distention.  Genitourinary: Negative for urgency and frequency.  Musculoskeletal: Negative for joint swelling and arthralgias.  Skin: Negative for color change and rash.  Neurological: Negative for weakness and light-headedness.  Hematological: Negative for adenopathy.  Psychiatric/Behavioral: Negative for behavioral problems.   Past Medical History  Diagnosis Date  . Hyperlipidemia   . GERD (gastroesophageal reflux disease)   . Hypertension   . BPH (benign prostatic hyperplasia)     History   Social History  . Marital Status: Married    Spouse Name: N/A    Number of Children: N/A  . Years of Education: N/A   Occupational History  . Not on file.   Social History Main Topics  . Smoking status: Never Smoker   . Smokeless tobacco: Not on file  . Alcohol Use: Not on file  . Drug Use: Not on file  . Sexually Active: Yes   Other Topics Concern  . Not on file   Social History Narrative  . No narrative on file    History reviewed. No pertinent past surgical history.  Family History  Problem Relation Age of Onset  . Cancer Father     lung  . Heart disease Father 44    PAD and CAD  . Heart disease Sister 4    mi  . Stroke Daughter     No Known Allergies  Current Outpatient Prescriptions on File Prior to Visit  Medication Sig Dispense Refill  . aspirin 81 MG tablet Take 81 mg by mouth daily.        . Azelastine-Fluticasone 137-50  MCG/ACT SUSP Place 1 spray into the nose 2 (two) times daily.  23 g  11  . clonazePAM (KLONOPIN) 0.5 MG tablet Take 1 tablet (0.5 mg total) by mouth at bedtime as needed.  30 tablet  5  . diazepam (VALIUM) 10 MG tablet Take 1 tablet (10 mg total) by mouth every 8 (eight) hours as needed.  30 tablet  3  . doxazosin (CARDURA) 2 MG tablet Take 1 tablet (2 mg total) by mouth at bedtime.  90 tablet  3  . fish oil-omega-3 fatty acids 1000 MG capsule Take 2 g by mouth daily.        . folic acid (FOLVITE) 1 MG tablet Take 1 tablet (1 mg total) by mouth daily.  90 tablet  3  . HYDROcodone-acetaminophen (VICODIN) 5-500 MG per tablet Take 1 tablet by mouth every 6 (six) hours as needed.  30 tablet  1  . Inositol Niacinate (NIACIN FLUSH FREE) 500 MG CAPS Take 1 capsule (500 mg total) by mouth 2 (two) times daily after a meal.  180 each  0  . losartan-hydrochlorothiazide (HYZAAR) 50-12.5 MG per tablet Take 1 tablet by mouth daily.  90 tablet  3  . RABEprazole (ACIPHEX) 20 MG tablet Take 1 tablet (20 mg total) by mouth daily.  90 tablet  3  . Tamsulosin HCl (FLOMAX) 0.4 MG CAPS Take 0.4 mg by mouth. Prn kidney  stones         No current facility-administered medications on file prior to visit.    BP 110/70  Pulse 68  Temp(Src) 97.8 F (36.6 C) (Oral)  Wt 260 lb (117.935 kg)  BMI 33.37 kg/m2       Objective:   Physical Exam  Constitutional: He appears well-developed and well-nourished.  HENT:  Head: Normocephalic and atraumatic.  Eyes: Conjunctivae are normal. Pupils are equal, round, and reactive to light.  Neck: Normal range of motion. Neck supple.  Cardiovascular: Normal rate and regular rhythm.   Pulmonary/Chest: Effort normal and breath sounds normal.  Abdominal: Soft. Bowel sounds are normal.          Assessment & Plan:  Monitoring stress HTN stable a1c and lipid  Diet reviewed  I have spent more than 30 minutes examining this patient face-to-face of which over half was spent in  counseling

## 2012-08-20 NOTE — Patient Instructions (Addendum)
The patient is instructed to continue all medications as prescribed. Schedule followup with check out clerk upon leaving the clinic  

## 2012-11-26 ENCOUNTER — Ambulatory Visit: Payer: 59 | Admitting: Internal Medicine

## 2012-12-12 ENCOUNTER — Other Ambulatory Visit: Payer: Self-pay | Admitting: *Deleted

## 2012-12-12 ENCOUNTER — Encounter: Payer: Self-pay | Admitting: Internal Medicine

## 2012-12-12 MED ORDER — METHYLPREDNISOLONE 4 MG PO KIT: PACK | ORAL | Status: DC

## 2013-03-04 ENCOUNTER — Encounter: Payer: Self-pay | Admitting: Internal Medicine

## 2013-03-04 ENCOUNTER — Ambulatory Visit (INDEPENDENT_AMBULATORY_CARE_PROVIDER_SITE_OTHER): Payer: 59 | Admitting: Internal Medicine

## 2013-03-04 VITALS — BP 130/80 | HR 72 | Temp 98.0°F | Resp 16 | Ht 74.0 in | Wt 264.0 lb

## 2013-03-04 DIAGNOSIS — E785 Hyperlipidemia, unspecified: Secondary | ICD-10-CM

## 2013-03-04 DIAGNOSIS — I10 Essential (primary) hypertension: Secondary | ICD-10-CM

## 2013-03-04 DIAGNOSIS — M549 Dorsalgia, unspecified: Secondary | ICD-10-CM

## 2013-03-04 NOTE — Progress Notes (Signed)
  Subjective:    Patient ID: Andrew Wilkins, male    DOB: [birth year], 47 y.o.   MRN: [id number]  HPI Weight gain and exercise Diet improvement Less stress Stable blood pressure     Review of Systems  Constitutional: Negative for fever and fatigue.  HENT: Negative for hearing loss, congestion, neck pain and postnasal drip.   Eyes: Negative for discharge, redness and visual disturbance.  Respiratory: Negative for cough, shortness of breath and wheezing.   Cardiovascular: Negative for leg swelling.  Gastrointestinal: Negative for abdominal pain, constipation and abdominal distention.  Genitourinary: Negative for urgency and frequency.  Musculoskeletal: Negative for joint swelling and arthralgias.  Skin: Negative for color change and rash.  Neurological: Negative for weakness and light-headedness.  Hematological: Negative for adenopathy.  Psychiatric/Behavioral: Negative for behavioral problems.       Objective:   Physical Exam  Constitutional: He appears well-developed and well-nourished.  HENT:  Head: Normocephalic and atraumatic.  Eyes: Conjunctivae are normal. Pupils are equal, round, and reactive to light.  Neck: Normal range of motion. Neck supple.  Cardiovascular: Normal rate and regular rhythm.   Pulmonary/Chest: Effort normal and breath sounds normal.  Abdominal: Soft. Bowel sounds are normal.          Assessment & Plan:  Portion control Stable HTN Lipids at goal

## 2013-03-13 ENCOUNTER — Encounter: Payer: Self-pay | Admitting: Internal Medicine

## 2013-03-29 ENCOUNTER — Encounter: Payer: Self-pay | Admitting: Internal Medicine

## 2013-04-19 ENCOUNTER — Other Ambulatory Visit: Payer: Self-pay | Admitting: Internal Medicine

## 2013-04-19 DIAGNOSIS — E785 Hyperlipidemia, unspecified: Secondary | ICD-10-CM

## 2013-04-19 MED ORDER — DIAZEPAM 10 MG PO TABS: 10.0000 mg | ORAL_TABLET | Freq: Three times a day (TID) | ORAL | Status: DC | PRN

## 2013-04-19 MED ORDER — INOSITOL NIACINATE 500 MG PO CAPS: 1.0000 | ORAL_CAPSULE | Freq: Two times a day (BID) | ORAL | Status: DC

## 2013-06-12 ENCOUNTER — Other Ambulatory Visit: Payer: Self-pay | Admitting: Internal Medicine

## 2013-07-29 ENCOUNTER — Other Ambulatory Visit: Payer: 59

## 2013-08-05 ENCOUNTER — Encounter: Payer: 59 | Admitting: Internal Medicine

## 2013-08-22 ENCOUNTER — Encounter: Payer: Self-pay | Admitting: Internal Medicine

## 2013-08-22 DIAGNOSIS — H6980 Other specified disorders of Eustachian tube, unspecified ear: Secondary | ICD-10-CM

## 2013-08-22 DIAGNOSIS — J45909 Unspecified asthma, uncomplicated: Secondary | ICD-10-CM

## 2013-08-22 DIAGNOSIS — H9319 Tinnitus, unspecified ear: Secondary | ICD-10-CM

## 2013-08-23 MED ORDER — AZELASTINE-FLUTICASONE 137-50 MCG/ACT NA SUSP
1.0000 | Freq: Two times a day (BID) | NASAL | Status: DC
Start: 2013-08-23 — End: 2014-08-25

## 2013-09-10 ENCOUNTER — Encounter: Payer: Self-pay | Admitting: *Deleted

## 2013-09-11 ENCOUNTER — Other Ambulatory Visit (INDEPENDENT_AMBULATORY_CARE_PROVIDER_SITE_OTHER): Payer: 59

## 2013-09-11 DIAGNOSIS — Z0000 Encounter for general adult medical examination without abnormal findings: Secondary | ICD-10-CM

## 2013-09-11 LAB — POCT URINALYSIS DIPSTICK
BILIRUBIN UA: NEGATIVE
Glucose, UA: NEGATIVE
KETONES UA: NEGATIVE
Leukocytes, UA: NEGATIVE
Nitrite, UA: NEGATIVE
PH UA: 5.5
PROTEIN UA: NEGATIVE
RBC UA: NEGATIVE
SPEC GRAV UA: 1.02
Urobilinogen, UA: 0.2

## 2013-09-11 LAB — TSH: TSH: 1.38 u[IU]/mL (ref 0.35–5.50)

## 2013-09-11 LAB — CBC WITH DIFFERENTIAL/PLATELET
BASOS ABS: 0 10*3/uL (ref 0.0–0.1)
Basophils Relative: 0.8 % (ref 0.0–3.0)
EOS PCT: 2.8 % (ref 0.0–5.0)
Eosinophils Absolute: 0.1 10*3/uL (ref 0.0–0.7)
HEMATOCRIT: 41.7 % (ref 39.0–52.0)
Hemoglobin: 14.4 g/dL (ref 13.0–17.0)
LYMPHS ABS: 1.3 10*3/uL (ref 0.7–4.0)
Lymphocytes Relative: 24.8 % (ref 12.0–46.0)
MCHC: 34.6 g/dL (ref 30.0–36.0)
MCV: 86.3 fl (ref 78.0–100.0)
MONO ABS: 0.3 10*3/uL (ref 0.1–1.0)
Monocytes Relative: 5.7 % (ref 3.0–12.0)
NEUTROS PCT: 65.9 % (ref 43.0–77.0)
Neutro Abs: 3.5 10*3/uL (ref 1.4–7.7)
PLATELETS: 212 10*3/uL (ref 150.0–400.0)
RBC: 4.83 Mil/uL (ref 4.22–5.81)
RDW: 13.5 % (ref 11.5–14.6)
WBC: 5.3 10*3/uL (ref 4.5–10.5)

## 2013-09-11 LAB — BASIC METABOLIC PANEL
BUN: 18 mg/dL (ref 6–23)
CALCIUM: 9.5 mg/dL (ref 8.4–10.5)
CO2: 27 mEq/L (ref 19–32)
Chloride: 106 mEq/L (ref 96–112)
Creatinine, Ser: 0.7 mg/dL (ref 0.4–1.5)
GFR: 119.67 mL/min (ref >60.00)
Glucose, Bld: 87 mg/dL (ref 70–99)
Potassium: 4.1 mEq/L (ref 3.5–5.1)
Sodium: 139 mEq/L (ref 135–145)

## 2013-09-11 LAB — HEPATIC FUNCTION PANEL
ALK PHOS: 65 U/L (ref 39–117)
ALT: 28 U/L (ref 0–53)
AST: 21 U/L (ref 0–37)
Albumin: 4.3 g/dL (ref 3.5–5.2)
BILIRUBIN DIRECT: 0.1 mg/dL (ref 0.0–0.3)
TOTAL PROTEIN: 6.5 g/dL (ref 6.0–8.3)
Total Bilirubin: 0.9 mg/dL (ref 0.3–1.2)

## 2013-09-11 LAB — PSA: PSA: 5.34 ng/mL — ABNORMAL HIGH (ref 0.10–4.00)

## 2013-09-11 LAB — LIPID PANEL
CHOLESTEROL: 103 mg/dL (ref 0–200)
HDL: 30.9 mg/dL — AB (ref >39.00)
LDL Cholesterol: 61 mg/dL (ref 0–99)
TRIGLYCERIDES: 54 mg/dL (ref 0.0–149.0)
Total CHOL/HDL Ratio: 3
VLDL: 10.8 mg/dL (ref 0.0–40.0)

## 2013-09-13 ENCOUNTER — Other Ambulatory Visit: Payer: 59

## 2013-09-20 ENCOUNTER — Encounter: Payer: 59 | Admitting: Internal Medicine

## 2013-09-20 ENCOUNTER — Encounter: Payer: Self-pay | Admitting: Internal Medicine

## 2013-09-20 ENCOUNTER — Ambulatory Visit (INDEPENDENT_AMBULATORY_CARE_PROVIDER_SITE_OTHER): Payer: 59 | Admitting: Internal Medicine

## 2013-09-20 VITALS — BP 110/70 | HR 68 | Temp 97.9°F | Ht 74.0 in | Wt 263.0 lb

## 2013-09-20 DIAGNOSIS — Z0000 Encounter for general adult medical examination without abnormal findings: Secondary | ICD-10-CM

## 2013-09-20 MED ORDER — HYDROCODONE-ACETAMINOPHEN 5-325 MG PO TABS: 1.0000 | ORAL_TABLET | Freq: Four times a day (QID) | ORAL | Status: DC | PRN

## 2013-09-20 MED ORDER — DIAZEPAM 10 MG PO TABS: 10.0000 mg | ORAL_TABLET | Freq: Three times a day (TID) | ORAL | Status: DC | PRN

## 2013-09-20 MED ORDER — CIPROFLOXACIN HCL 500 MG PO TABS: 500.0000 mg | ORAL_TABLET | Freq: Two times a day (BID) | ORAL | Status: DC

## 2013-09-20 NOTE — Patient Instructions (Signed)
The patient is instructed to continue all medications as prescribed. Schedule followup with check out clerk upon leaving the clinic  

## 2013-09-20 NOTE — Progress Notes (Signed)
Subjective:    Patient ID: Andrew Wilkins, male    DOB: 10-10-1965, 48 y.o.   MRN: [id number]  HPI  CPX Opportunity for weigth loss transformation  Review of Systems  Constitutional: Negative for fever and fatigue.  HENT: Negative for congestion, hearing loss and postnasal drip.   Eyes: Negative for discharge, redness and visual disturbance.  Respiratory: Negative for cough, shortness of breath and wheezing.   Cardiovascular: Negative for leg swelling.  Gastrointestinal: Negative for abdominal pain, constipation and abdominal distention.  Genitourinary: Negative for urgency and frequency.  Musculoskeletal: Negative for arthralgias, joint swelling and neck pain.  Skin: Negative for color change and rash.  Neurological: Negative for weakness and light-headedness.  Hematological: Negative for adenopathy.  Psychiatric/Behavioral: Negative for behavioral problems.       Past Medical History  Diagnosis Date  . Hyperlipidemia   . GERD (gastroesophageal reflux disease)   . Hypertension   . BPH (benign prostatic hyperplasia)     History   Social History  . Marital Status: Married    Spouse Name: N/A    Number of Children: N/A  . Years of Education: N/A   Occupational History  . Not on file.   Social History Main Topics  . Smoking status: Never Smoker   . Smokeless tobacco: Not on file  . Alcohol Use: Not on file  . Drug Use: Not on file  . Sexual Activity: Yes   Other Topics Concern  . Not on file   Social History Narrative  . No narrative on file    No past surgical history on file.  Family History  Problem Relation Age of Onset  . Cancer Father     lung  . Heart disease Father 75    PAD and CAD  . Heart disease Sister 65    mi  . Stroke Daughter     No Known Allergies  Current Outpatient Prescriptions on File Prior to Visit  Medication Sig Dispense Refill  . aspirin 81 MG tablet Take 81 mg by mouth daily.        . Azelastine-Fluticasone 137-50  MCG/ACT SUSP Place 1 spray into the nose 2 (two) times daily.  23 g  11  . clonazePAM (KLONOPIN) 0.5 MG tablet Take 1 tablet (0.5 mg total) by mouth at bedtime as needed.  30 tablet  5  . CRESTOR 10 MG tablet Take 1 tablet by mouth  every day  90 tablet  3  . diazepam (VALIUM) 10 MG tablet Take 1 tablet (10 mg total) by mouth every 8 (eight) hours as needed.  30 tablet  0  . doxazosin (CARDURA) 2 MG tablet Take 1 tablet (2 mg total)  by mouth at bedtime.  90 tablet  3  . fish oil-omega-3 fatty acids 1000 MG capsule Take 2 g by mouth daily.        . folic acid (FOLVITE) 1 MG tablet Take 1 tablet (1 mg total)  by mouth daily.  90 tablet  3  . HYDROcodone-acetaminophen (VICODIN) 5-500 MG per tablet Take 1 tablet by mouth every 6 (six) hours as needed.  30 tablet  1  . Inositol Niacinate (NIACIN FLUSH FREE) 500 MG CAPS Take 1 capsule (500 mg total) by mouth 2 (two) times daily after a meal.  180 each  0  . losartan-hydrochlorothiazide (HYZAAR) 50-12.5 MG per tablet Take 1 tablet by mouth  daily  90 tablet  3  . RABEprazole (ACIPHEX) 20 MG tablet Take 1  tablet (20 mg total) by mouth daily.  90 tablet  3  . Tamsulosin HCl (FLOMAX) 0.4 MG CAPS Take 0.4 mg by mouth. Prn kidney stones         No current facility-administered medications on file prior to visit.    BP 110/70  Pulse 68  Temp(Src) 97.9 F (36.6 C) (Oral)  Ht 6\' 2"  (1.88 m)  Wt 263 lb (119.296 kg)  BMI 33.75 kg/m2     Objective:   Physical Exam  Nursing note and vitals reviewed. Constitutional: He is oriented to person, place, and time. He appears well-developed and well-nourished.  HENT:  Head: Normocephalic and atraumatic.  Eyes: Conjunctivae are normal. Pupils are equal, round, and reactive to light.  Neck: Normal range of motion. Neck supple.  Cardiovascular: Normal rate and regular rhythm.   Pulmonary/Chest: Effort normal and breath sounds normal.  Abdominal: Soft. Bowel sounds are normal.  Genitourinary: Rectum normal.   Musculoskeletal: Normal range of motion.  Neurological: He is alert and oriented to person, place, and time.  Skin: Skin is warm and dry.  Psychiatric: He has a normal mood and affect. His behavior is normal.          Assessment & Plan:  Patient presents for yearly preventative medicine examination. Medicare questionnaire was completed  All immunizations and health maintenance protocols were reviewed with the patient and needed orders were placed.  Appropriate screening laboratory values were ordered for the patient including screening of hyperlipidemia, renal function and hepatic function. If indicated by BPH, a PSA was ordered.  Medication reconciliation,  past medical history, social history, problem list and allergies were reviewed in detail with the patient  Goals were established with regard to weight loss, exercise, and  diet in compliance with medications  End of life planning was discussed.

## 2013-09-20 NOTE — Addendum Note (Signed)
Addended by: Ricard Dillon on: 09/20/2013 03:48 PM   Modules accepted: Orders

## 2013-09-20 NOTE — Progress Notes (Signed)
Pre visit review using our clinic review tool, if applicable. No additional management support is needed unless otherwise documented below in the visit note. 

## 2013-11-04 ENCOUNTER — Other Ambulatory Visit (INDEPENDENT_AMBULATORY_CARE_PROVIDER_SITE_OTHER): Payer: 59

## 2013-11-04 DIAGNOSIS — Z0000 Encounter for general adult medical examination without abnormal findings: Secondary | ICD-10-CM

## 2013-11-05 LAB — PSA, TOTAL AND FREE
PSA, Free Pct: 22 % — ABNORMAL LOW (ref >25)
PSA, Free: 0.75 ng/mL
PSA: 3.48 ng/mL (ref <=4.00)

## 2013-11-20 ENCOUNTER — Encounter: Payer: Self-pay | Admitting: Internal Medicine

## 2014-02-04 ENCOUNTER — Telehealth: Payer: Self-pay | Admitting: Internal Medicine

## 2014-02-04 NOTE — Telephone Encounter (Signed)
Lm on vm to cb and resc appt

## 2014-02-06 NOTE — Telephone Encounter (Signed)
appt made

## 2014-03-17 ENCOUNTER — Other Ambulatory Visit: Payer: 59

## 2014-03-24 ENCOUNTER — Ambulatory Visit: Payer: 59 | Admitting: Internal Medicine

## 2014-04-02 ENCOUNTER — Encounter: Payer: Self-pay | Admitting: Family Medicine

## 2014-04-02 ENCOUNTER — Ambulatory Visit (INDEPENDENT_AMBULATORY_CARE_PROVIDER_SITE_OTHER): Payer: 59 | Admitting: Family Medicine

## 2014-04-02 VITALS — BP 102/72 | Temp 98.4°F | Wt 250.0 lb

## 2014-04-02 DIAGNOSIS — N400 Enlarged prostate without lower urinary tract symptoms: Secondary | ICD-10-CM

## 2014-04-02 DIAGNOSIS — R972 Elevated prostate specific antigen [PSA]: Secondary | ICD-10-CM

## 2014-04-02 DIAGNOSIS — Z8249 Family history of ischemic heart disease and other diseases of the circulatory system: Secondary | ICD-10-CM

## 2014-04-02 DIAGNOSIS — E669 Obesity, unspecified: Secondary | ICD-10-CM

## 2014-04-02 DIAGNOSIS — E785 Hyperlipidemia, unspecified: Secondary | ICD-10-CM

## 2014-04-02 DIAGNOSIS — N200 Calculus of kidney: Secondary | ICD-10-CM | POA: Insufficient documentation

## 2014-04-02 DIAGNOSIS — D1803 Hemangioma of intra-abdominal structures: Secondary | ICD-10-CM | POA: Insufficient documentation

## 2014-04-02 DIAGNOSIS — Z833 Family history of diabetes mellitus: Secondary | ICD-10-CM

## 2014-04-02 DIAGNOSIS — I10 Essential (primary) hypertension: Secondary | ICD-10-CM

## 2014-04-02 NOTE — Assessment & Plan Note (Signed)
Well controlled with losartan hydrochlorothiazide discontinued. Doxazosin also has some blood pressure lowering effects

## 2014-04-02 NOTE — Progress Notes (Signed)
Andrew Reddish, MD Phone: [phone]  Subjective:  Patient presents today to establish care with me as their new primary care provider. Patient was formerly a patient of Dr. Arnoldo Morale. Chief complaint-noted.   Hypertension-well controlled BP Readings from Last 3 Encounters:  04/02/14 102/72  09/20/13 110/70  03/04/13 130/80   patient also obese and has a family history of diabetes Home BP monitoring-no Compliant with medications-yes without side effects ROS-Denies any CP, HA, SOB, blurry vision, LE edema.   Hyperlipidemia-well control Lab Results  Component Value Date   LDLCALC 61 09/11/2013   On statin: Crestor 10 mg and niacin Discussed no mortality benefit from niacin Regular exercise: Starting to walk regularly as he just a puppy ROS- no chest pain or shortness of breath. No myalgias   BPH with history elevated PSA-well-controlled -urinary frequency and smell of urine. 21 days cipro. PSA 5.34 6 months ago and went down to 3.48 after treatment. BPH symptoms for at least a year-cardura a year ago. Takes Cardura. Has Flomax in case he has kidney stones unaware of postural hypotension ROS-no dysuria or penile discharge  The following were reviewed and entered/updated in epic: Past Medical History  Diagnosis Date  . Hyperlipidemia   . GERD (gastroesophageal reflux disease)   . Hypertension   . BPH (benign prostatic hyperplasia)   . Plantar fasciitis     cortisone injection   Patient Active Problem List   Diagnosis Date Noted  . BPH (benign prostatic hyperplasia) 04/02/2014    Priority: Medium  . Family history of peripheral vascular disease 03/17/2011    Priority: Medium  . Hyperlipidemia 02/26/2007    Priority: Medium  . Essential hypertension 02/26/2007    Priority: Medium  . Hemangioma of liver 04/02/2014    Priority: Low  . Nephrolithiasis 04/02/2014    Priority: Low  . Lipoma 07/08/2009    Priority: Low  . GERD 06/18/2007    Priority: Low  . INSOMNIA,  PERSISTENT 02/26/2007    Priority: Low  . Osteoarthritis 02/26/2007    Priority: Low   Past Surgical History  Procedure Laterality Date  . None      Family History  Problem Relation Age of Onset  . Lung cancer Father     former smoker  . Heart disease Father 7    PAD (carotid endarterectomy at 54) and CAD with MI in late 69s early 70s.   . Heart disease Sister 62    mi, smoker  . Crohn's disease Daughter   . Stroke Paternal Grandfather 66  . Stroke Brother 83    physician who stopped practicing as result    Medications- reviewed and updated Current Outpatient Prescriptions  Medication Sig Dispense Refill  . aspirin 81 MG tablet Take 81 mg by mouth daily.        . clonazePAM (KLONOPIN) 0.5 MG tablet Take 1 tablet (0.5 mg total) by mouth at bedtime as needed.  30 tablet  5  . CRESTOR 10 MG tablet Take 1 tablet by mouth  every day  90 tablet  3  . doxazosin (CARDURA) 2 MG tablet Take 1 tablet (2 mg total)  by mouth at bedtime.  90 tablet  3  . fish oil-omega-3 fatty acids 1000 MG capsule Take 2 g by mouth daily.        . folic acid (FOLVITE) 1 MG tablet Take 1 tablet (1 mg total)  by mouth daily.  90 tablet  3  . Inositol Niacinate (NIACIN FLUSH FREE) 500 MG CAPS  Take 1 capsule (500 mg total) by mouth 2 (two) times daily after a meal.  180 each  0  . losartan-hydrochlorothiazide (HYZAAR) 50-12.5 MG per tablet Take 1 tablet by mouth  daily  90 tablet  3  . RABEprazole (ACIPHEX) 20 MG tablet Take 1 tablet (20 mg total) by mouth daily.  90 tablet  3  . Azelastine-Fluticasone 137-50 MCG/ACT SUSP Place 1 spray into the nose 2 (two) times daily.  23 g  11  . diazepam (VALIUM) 10 MG tablet Take 1 tablet (10 mg total) by mouth every 8 (eight) hours as needed.  30 tablet  3  . HYDROcodone-acetaminophen (LORCET) 5-325 MG per tablet Take 1 tablet by mouth every 6 (six) hours as needed.  30 tablet  0   No current facility-administered medications for this visit.    Allergies-reviewed and  updated No Known Allergies  History   Social History  . Marital Status: Married    Spouse Name: N/A    Number of Children: N/A  . Years of Education: N/A   Social History Main Topics  . Smoking status: Never Smoker   . Smokeless tobacco: None  . Alcohol Use: Yes     Comment: 3 per month  . Drug Use: No  . Sexual Activity: Yes   Other Topics Concern  . None   Social History Narrative   Married 1990. 3 kids- 9-67 years old in 2015.    New dog in 2015-walking regularly. 3.5-4 hour walks on weekend.       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  Ext: no edema Skin: warm, dry, upper body skin check without suspicious lesions (using sun screen) Neuro: grossly normal, moves all extremities, PERRLA   Assessment/Plan:  Hyperlipidemia Well controlled on Crestor and niacin. Discussed niacin have a mortality benefit. Recheck labs as he has been exercising more, consider D/C niacin  Essential hypertension Well controlled with losartan hydrochlorothiazide discontinued. Doxazosin also has some blood pressure lowering effects  BPH (benign prostatic hyperplasia) Well controlled on doxazosin - continue   Future fasting labs Orders Placed This Encounter  Procedures  . CBC    [place]    Standing Status: Future     Number of Occurrences:      Standing Expiration Date: 10/02/2014  . Comprehensive metabolic panel    [place]    Standing Status: Future     Number of Occurrences:      Standing Expiration Date: 10/02/2014    . Lipid panel    [place]    Standing Status: Future     Number of Occurrences:      Standing Expiration Date: 10/02/2014  . Hemoglobin A1c    [place]    Standing Status: Future     Number of Occurrences:      Standing Expiration Date: 10/02/2014  . TSH    [place]    Standing Status: Future     Number of Occurrences:      Standing Expiration Date: 10/02/2014  . PSA  Standing Status: Future     Number of Occurrences:      Standing Expiration Date: 10/02/2014    No orders of the defined types were placed in this encounter.

## 2014-04-02 NOTE — Assessment & Plan Note (Addendum)
Well controlled on doxazosin - continue. History of elevated PSA likely due to prostatitis-follow PSA closely and if still in normal range-repeat in 1 year.

## 2014-04-02 NOTE — Assessment & Plan Note (Signed)
Well controlled on Crestor and niacin. Discussed niacin have a mortality benefit. Recheck labs as he has been exercising more, consider D/C niacin

## 2014-04-02 NOTE — Patient Instructions (Addendum)
Great to meet you!  Come back at your convenience for labs.   No changes today.   Schedule your physical for next visit.   Patient to send mychart message when he gets his flu shot through work

## 2014-04-04 ENCOUNTER — Encounter: Payer: Self-pay | Admitting: Family Medicine

## 2014-04-09 ENCOUNTER — Telehealth: Payer: Self-pay | Admitting: Family Medicine

## 2014-04-09 MED ORDER — RABEPRAZOLE SODIUM 20 MG PO TBEC: 20.0000 mg | DELAYED_RELEASE_TABLET | Freq: Every day | ORAL | Status: DC

## 2014-04-09 MED ORDER — LOSARTAN POTASSIUM-HCTZ 50-12.5 MG PO TABS: 1.0000 | ORAL_TABLET | Freq: Every day | ORAL | Status: DC

## 2014-04-09 NOTE — Telephone Encounter (Signed)
Medications refilled

## 2014-04-09 NOTE — Telephone Encounter (Signed)
[facility] is requesting re-fills on RABEprazole (ACIPHEX) 20 MG tablet, CRESTOR 10 MG tablet, and losartan-hydrochlorothiazide (HYZAAR) 50-12.5 MG per tablet

## 2014-05-02 ENCOUNTER — Encounter: Payer: Self-pay | Admitting: Family Medicine

## 2014-05-09 ENCOUNTER — Other Ambulatory Visit (INDEPENDENT_AMBULATORY_CARE_PROVIDER_SITE_OTHER): Payer: 59

## 2014-05-09 DIAGNOSIS — Z833 Family history of diabetes mellitus: Secondary | ICD-10-CM

## 2014-05-09 DIAGNOSIS — E785 Hyperlipidemia, unspecified: Secondary | ICD-10-CM

## 2014-05-09 DIAGNOSIS — E669 Obesity, unspecified: Secondary | ICD-10-CM

## 2014-05-09 DIAGNOSIS — I10 Essential (primary) hypertension: Secondary | ICD-10-CM

## 2014-05-09 DIAGNOSIS — N400 Enlarged prostate without lower urinary tract symptoms: Secondary | ICD-10-CM

## 2014-05-11 LAB — COMPREHENSIVE METABOLIC PANEL
ALBUMIN: 4.3 g/dL (ref 3.5–5.2)
ALT: 36 U/L (ref 0–53)
AST: 22 U/L (ref 0–37)
Alkaline Phosphatase: 67 U/L (ref 39–117)
BILIRUBIN TOTAL: 0.8 mg/dL (ref 0.2–1.2)
BUN: 20 mg/dL (ref 6–23)
CALCIUM: 9.4 mg/dL (ref 8.4–10.5)
CHLORIDE: 108 meq/L (ref 96–112)
CO2: 25 meq/L (ref 19–32)
Creatinine, Ser: 0.8 mg/dL (ref 0.4–1.5)
GFR: 109.08 mL/min (ref >60.00)
GLUCOSE: 81 mg/dL (ref 70–99)
Potassium: 4.4 mEq/L (ref 3.5–5.1)
SODIUM: 141 meq/L (ref 135–145)
TOTAL PROTEIN: 6.5 g/dL (ref 6.0–8.3)

## 2014-05-11 LAB — LIPID PANEL
CHOLESTEROL: 103 mg/dL (ref 0–200)
HDL: 31.6 mg/dL — ABNORMAL LOW (ref >39.00)
LDL Cholesterol: 60 mg/dL (ref 0–99)
NONHDL: 71.4
Total CHOL/HDL Ratio: 3
Triglycerides: 57 mg/dL (ref 0.0–149.0)
VLDL: 11.4 mg/dL (ref 0.0–40.0)

## 2014-05-11 LAB — CBC
HEMATOCRIT: 42.7 % (ref 39.0–52.0)
Hemoglobin: 13.9 g/dL (ref 13.0–17.0)
MCHC: 32.4 g/dL (ref 30.0–36.0)
MCV: 88 fl (ref 78.0–100.0)
Platelets: 196 10*3/uL (ref 150.0–400.0)
RBC: 4.86 Mil/uL (ref 4.22–5.81)
RDW: 13.5 % (ref 11.5–15.5)
WBC: 5.1 10*3/uL (ref 4.0–10.5)

## 2014-05-11 LAB — TSH: TSH: 1.57 u[IU]/mL (ref 0.35–4.50)

## 2014-05-11 LAB — HEMOGLOBIN A1C: Hgb A1c MFr Bld: 5.9 % (ref 4.6–6.5)

## 2014-05-11 LAB — PSA: PSA: 4.78 ng/mL — AB (ref 0.10–4.00)

## 2014-05-12 ENCOUNTER — Encounter: Payer: Self-pay | Admitting: Family Medicine

## 2014-05-12 DIAGNOSIS — R972 Elevated prostate specific antigen [PSA]: Secondary | ICD-10-CM

## 2014-05-22 ENCOUNTER — Encounter: Payer: Self-pay | Admitting: Family Medicine

## 2014-07-07 ENCOUNTER — Encounter: Payer: Self-pay | Admitting: Family Medicine

## 2014-07-15 ENCOUNTER — Encounter: Payer: Self-pay | Admitting: Family Medicine

## 2014-07-15 ENCOUNTER — Telehealth: Payer: Self-pay

## 2014-07-15 NOTE — Telephone Encounter (Signed)
Andrew Wilkins, please make patient aware of our process for form completion.   I would love to not have non-clinical questions forwarded to me in the future. Once the form completion question is handled, I am happy to review the clinical portion (BPH follow up).

## 2014-07-15 NOTE — Telephone Encounter (Signed)
[facility] refill requests for FOLIC ACID TAB 1MG  #90, ACIPHEX TAB 20MG  #90, CRESTOR TAB 10MG  #90, HYZAAR TAB 50-12.5MG  #90, and CARDURA TAB 2MG  #90

## 2014-07-16 MED ORDER — ROSUVASTATIN CALCIUM 10 MG PO TABS: ORAL_TABLET | ORAL | Status: DC

## 2014-07-16 MED ORDER — DOXAZOSIN MESYLATE 2 MG PO TABS: ORAL_TABLET | ORAL | Status: DC

## 2014-07-16 MED ORDER — FOLIC ACID 1 MG PO TABS: ORAL_TABLET | ORAL | Status: DC

## 2014-07-16 MED ORDER — LOSARTAN POTASSIUM-HCTZ 50-12.5 MG PO TABS: 1.0000 | ORAL_TABLET | Freq: Every day | ORAL | Status: DC

## 2014-07-16 MED ORDER — RABEPRAZOLE SODIUM 20 MG PO TBEC: 20.0000 mg | DELAYED_RELEASE_TABLET | Freq: Every day | ORAL | Status: DC

## 2014-07-16 NOTE — Telephone Encounter (Signed)
Medications refilled

## 2014-08-22 ENCOUNTER — Encounter: Payer: Self-pay | Admitting: Family Medicine

## 2014-08-25 ENCOUNTER — Other Ambulatory Visit: Payer: Self-pay

## 2014-08-25 ENCOUNTER — Telehealth: Payer: Self-pay

## 2014-08-25 DIAGNOSIS — J45909 Unspecified asthma, uncomplicated: Secondary | ICD-10-CM

## 2014-08-25 DIAGNOSIS — H9319 Tinnitus, unspecified ear: Secondary | ICD-10-CM

## 2014-08-25 DIAGNOSIS — H6980 Other specified disorders of Eustachian tube, unspecified ear: Secondary | ICD-10-CM

## 2014-08-25 MED ORDER — AZELASTINE-FLUTICASONE 137-50 MCG/ACT NA SUSP: 1.0000 | Freq: Two times a day (BID) | NASAL | Status: DC

## 2014-08-25 NOTE — Telephone Encounter (Signed)
Medication refilled

## 2014-08-25 NOTE — Telephone Encounter (Signed)
Rx request for Dymista spray  Pharm:  [facility]

## 2014-10-02 ENCOUNTER — Ambulatory Visit (INDEPENDENT_AMBULATORY_CARE_PROVIDER_SITE_OTHER): Payer: 59 | Admitting: Family Medicine

## 2014-10-02 ENCOUNTER — Encounter: Payer: Self-pay | Admitting: Family Medicine

## 2014-10-02 VITALS — BP 98/60 | HR 76 | Temp 98.0°F | Wt 263.0 lb

## 2014-10-02 DIAGNOSIS — N400 Enlarged prostate without lower urinary tract symptoms: Secondary | ICD-10-CM

## 2014-10-02 DIAGNOSIS — Z0000 Encounter for general adult medical examination without abnormal findings: Secondary | ICD-10-CM | POA: Diagnosis not present

## 2014-10-02 MED ORDER — AZELASTINE HCL 0.1 % NA SOLN: 1.0000 | Freq: Two times a day (BID) | NASAL | Status: DC

## 2014-10-02 MED ORDER — DIAZEPAM 10 MG PO TABS: 10.0000 mg | ORAL_TABLET | Freq: Three times a day (TID) | ORAL | Status: DC | PRN

## 2014-10-02 MED ORDER — HYDROCODONE-ACETAMINOPHEN 5-325 MG PO TABS: 1.0000 | ORAL_TABLET | Freq: Four times a day (QID) | ORAL | Status: DC | PRN

## 2014-10-02 NOTE — Assessment & Plan Note (Signed)
Sounds to have statin related myalgias. We will trial off crestor 1 week then resume every other day with follow up lipids in 3 months. He will also get his HM HIV test as well as a1c for at risk DM at that time.

## 2014-10-02 NOTE — Progress Notes (Signed)
Andrew Reddish, MD Phone: [phone]  Subjective:  Patient presents today for their annual physical. Chief complaint-noted.   Ankle Sprain when falling off ladder and has seen Dr. Alfonso Ramus. Activity decreased as a result.   Myalgias Primarily in the thighs. Worse with activity at times but not always predictable. Still doing 9000 or 10000 steps per day.   Admits to some overeating in this time as well  Knows at risk for DM and needs to get weight down.   ROS- full  review of systems was completed and negative except for as noted above. No chest pain, shortness of breath, reproducible calf or thigh pain with certain walking distance.   The following were reviewed and entered/updated in epic: Past Medical History  Diagnosis Date  . Hyperlipidemia   . GERD (gastroesophageal reflux disease)   . Hypertension   . BPH (benign prostatic hyperplasia)   . Plantar fasciitis     cortisone injection   Patient Active Problem List   Diagnosis Date Noted  . BPH (benign prostatic hyperplasia) 04/02/2014    Priority: Medium  . Family history of peripheral vascular disease 03/17/2011    Priority: Medium  . Hyperlipidemia 02/26/2007    Priority: Medium  . Essential hypertension 02/26/2007    Priority: Medium  . Hemangioma of liver 04/02/2014    Priority: Low  . Nephrolithiasis 04/02/2014    Priority: Low  . Lipoma 07/08/2009    Priority: Low  . GERD 06/18/2007    Priority: Low  . INSOMNIA, PERSISTENT 02/26/2007    Priority: Low  . Osteoarthritis 02/26/2007    Priority: Low   Past Surgical History  Procedure Laterality Date  . None      Family History  Problem Relation Age of Onset  . Lung cancer Father     former smoker  . Heart disease Father 76    PAD (carotid endarterectomy at 49) and CAD with MI in late 64s early 58s.   . Heart disease Sister 2    mi, smoker  . Crohn's disease Daughter   . Stroke Paternal Grandfather 47  . Stroke Brother 19    physician who stopped  practicing as result    Medications- reviewed and updated Current Outpatient Prescriptions  Medication Sig Dispense Refill  . aspirin 81 MG tablet Take 81 mg by mouth daily.      . clonazePAM (KLONOPIN) 0.5 MG tablet Take 1 tablet (0.5 mg total) by mouth at bedtime as needed. 30 tablet 5  . doxazosin (CARDURA) 2 MG tablet Take 1 tablet (2 mg total)  by mouth at bedtime. 90 tablet 3  . fish oil-omega-3 fatty acids 1000 MG capsule Take 2 g by mouth daily.      . folic acid (FOLVITE) 1 MG tablet Take 1 tablet (1 mg total)  by mouth daily. 90 tablet 3  . Inositol Niacinate (NIACIN FLUSH FREE) 500 MG CAPS Take 1 capsule (500 mg total) by mouth 2 (two) times daily after a meal. 180 each 0  . losartan-hydrochlorothiazide (HYZAAR) 50-12.5 MG per tablet Take 1 tablet by mouth daily. 90 tablet 1  . RABEprazole (ACIPHEX) 20 MG tablet Take 1 tablet (20 mg total) by mouth daily. 90 tablet 3  . rosuvastatin (CRESTOR) 10 MG tablet Take 1 tablet by mouth  every day 90 tablet 3  . Azelastine-Fluticasone 137-50 MCG/ACT SUSP Place 1 spray into the nose 2 (two) times daily. (Patient not taking: Reported on 10/02/2014) 23 g 11  . diazepam (VALIUM) 10 MG  tablet Take 1 tablet (10 mg total) by mouth every 8 (eight) hours as needed. (Patient not taking: Reported on 10/02/2014) 30 tablet 3  . HYDROcodone-acetaminophen (LORCET) 5-325 MG per tablet Take 1 tablet by mouth every 6 (six) hours as needed. (Patient not taking: Reported on 10/02/2014) 30 tablet 0   No current facility-administered medications for this visit.    Allergies-reviewed and updated No Known Allergies  History   Social History  . Marital Status: Married    Spouse Name: N/A  . Number of Children: N/A  . Years of Education: N/A   Social History Main Topics  . Smoking status: Never Smoker   . Smokeless tobacco: Not on file  . Alcohol Use: Yes     Comment: 3 per month  . Drug Use: No  . Sexual Activity: Yes   Other Topics Concern  . Not on  file   Social History Narrative   Married 1990. 3 kids- 7-7 years old in 2015.    New dog in 2015-walking regularly. 3.5-4 hour walks on weekend.       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 Oropharynx normal Neck: no thyromegaly CV: RRR no murmurs rubs or gallops Lungs: CTAB no crackles, wheeze, rhonchi Abdomen: soft/nontender/nondistended/normal bowel sounds. No rebound or guarding. Obese Ext: no edema, 2+ PT and DP pulses Skin: warm, dry, no rash, scar left upper back from prior lipoma excision Neuro: grossly normal, moves all extremities, PERRLA  Assessment/Plan:  49 y.o. male presenting for annual physical.  Health Maintenance counseling: 1. Anticipatory guidance: Patient counseled regarding regular dental exams, wearing seatbelts, wearing sunscreen. Dermatology visit in [place] with cryo, 1 year follow up.  2. Risk factor reduction:  Advised patient of need for regular exercise and diet rich and fruits and vegetables to reduce risk of heart attack and stroke.  3. Immunizations/screenings/ancillary studies Health Maintenance Due  Topic Date Due  . HIV Screening  01/29/1980  Fasting HIV, lipids, a1c in 3 months  Hyperlipidemia Sounds to have statin related myalgias. We will trial off crestor 1 week then resume every other day with follow up lipids in 3 months. He will also get his HM HIV test as well as a1c for at risk DM at that time.    BPH (benign prostatic hyperplasia) Doxazosin-reasonable control. Elevated PSA >4, negative prostate biopsy 2016. Defer rectal exam, PSA to urology   6 month in  office.  Intermittent back spasms throughout the year-uses valium and lorcet. Knows he needs to lose weight and we counseled. Astelin for allergies-prior rx not covered.  Meds ordered this encounter  Medications  . azelastine (ASTELIN) 0.1 % nasal spray    Sig: Place 1 spray into both nostrils 2 (two) times daily. Use in each nostril as directed    Dispense:  30 mL    Refill:  11  . diazepam (VALIUM) 10 MG tablet    Sig: Take 1 tablet (10 mg total) by mouth every 8 (eight) hours as needed.    Dispense:  30 tablet    Refill:  0  . HYDROcodone-acetaminophen (LORCET) 5-325 MG  per tablet    Sig: Take 1 tablet by mouth every 6 (six) hours as needed for moderate pain (as needed for back spasms).    Dispense:  20 tablet    Refill:  0

## 2014-10-02 NOTE — Assessment & Plan Note (Signed)
Doxazosin-reasonable control. Elevated PSA >4, negative prostate biopsy 2016. Defer rectal exam, PSA to urology

## 2014-10-02 NOTE — Patient Instructions (Signed)
Things look great today, only concern is potential statin related myalgias.   Let's trial off crestor for 1 week, please journal your symptoms in this time. Then resume every other day dosing.   Come back in 3 months for labs for a1c, HIV for national screening recommendations 1x, lipid.   Sorry about your ankle, excited for you to get activity back and work on getting the weight back off. Would target about a pound a week weight loss. Some other tips below  6 month follow up.     My 5 to Fitness!  5: fruits and vegetables per day (work on 9 per day if you are at 5) 4: exercise 4-5 times per week for at least 30 minutes (walking counts!) 3: meals per day (don't skip breakfast!) 2: habits to quit -smoking -excess alcohol use (men >2 beer/day; women >1beer/day) 1: sweet per day (2 cookies, 1 small cup of ice cream, 12 oz soda)  These are general tips for healthy living. Try to start with 1 or 2 habit TODAY and make it a part of your life for several months.   Once you have 1 or 2 habits down for several months, try to begin working on your next healthy habit. With every single step you take, you will be leading a healthier lifestyle!

## 2014-11-02 ENCOUNTER — Encounter: Payer: Self-pay | Admitting: Family Medicine

## 2014-11-04 ENCOUNTER — Encounter: Payer: Self-pay | Admitting: Family Medicine

## 2014-11-04 NOTE — Telephone Encounter (Signed)
I would say let's go with [facility] orth[facility]ics so they can help tease out back vs. Hips. They could always get you back to Dr. Arnoldo Morale if needed.   Bevelyn Ngo- please place referral to [facility] for low back pain and bilateral hip pain.

## 2014-11-05 ENCOUNTER — Telehealth: Payer: Self-pay

## 2014-11-05 DIAGNOSIS — M25552 Pain in left hip: Secondary | ICD-10-CM

## 2014-11-05 DIAGNOSIS — M25551 Pain in right hip: Secondary | ICD-10-CM

## 2014-11-05 DIAGNOSIS — M545 Low back pain: Secondary | ICD-10-CM

## 2014-11-05 NOTE — Telephone Encounter (Signed)
Referral entered  

## 2014-12-17 ENCOUNTER — Encounter (HOSPITAL_COMMUNITY): Payer: Self-pay | Admitting: *Deleted

## 2014-12-17 ENCOUNTER — Emergency Department (HOSPITAL_COMMUNITY)
Admission: EM | Admit: 2014-12-17 | Discharge: 2014-12-17 | Disposition: A | Discharge status: Home / Self Care | Payer: 59 | Attending: Emergency Medicine | Admitting: Emergency Medicine

## 2014-12-17 DIAGNOSIS — N200 Calculus of kidney: Secondary | ICD-10-CM | POA: Diagnosis not present

## 2014-12-17 DIAGNOSIS — I10 Essential (primary) hypertension: Secondary | ICD-10-CM | POA: Insufficient documentation

## 2014-12-17 DIAGNOSIS — Z7982 Long term (current) use of aspirin: Secondary | ICD-10-CM | POA: Insufficient documentation

## 2014-12-17 DIAGNOSIS — K219 Gastro-esophageal reflux disease without esophagitis: Secondary | ICD-10-CM | POA: Insufficient documentation

## 2014-12-17 DIAGNOSIS — R319 Hematuria, unspecified: Secondary | ICD-10-CM

## 2014-12-17 DIAGNOSIS — N390 Urinary tract infection, site not specified: Secondary | ICD-10-CM | POA: Diagnosis not present

## 2014-12-17 DIAGNOSIS — Z79899 Other long term (current) drug therapy: Secondary | ICD-10-CM | POA: Diagnosis not present

## 2014-12-17 DIAGNOSIS — Z8739 Personal history of other diseases of the musculoskeletal system and connective tissue: Secondary | ICD-10-CM | POA: Diagnosis not present

## 2014-12-17 DIAGNOSIS — E785 Hyperlipidemia, unspecified: Secondary | ICD-10-CM | POA: Insufficient documentation

## 2014-12-17 DIAGNOSIS — Z87438 Personal history of other diseases of male genital organs: Secondary | ICD-10-CM | POA: Insufficient documentation

## 2014-12-17 DIAGNOSIS — R109 Unspecified abdominal pain: Secondary | ICD-10-CM | POA: Diagnosis present

## 2014-12-17 HISTORY — DX: Calculus of kidney: N20.0

## 2014-12-17 LAB — COMPREHENSIVE METABOLIC PANEL
ALK PHOS: 70 U/L (ref 38–126)
ALT: 30 U/L (ref 17–63)
AST: 23 U/L (ref 15–41)
Albumin: 4.6 g/dL (ref 3.5–5.0)
Anion gap: 9 (ref 5–15)
BILIRUBIN TOTAL: 0.5 mg/dL (ref 0.3–1.2)
BUN: 23 mg/dL — ABNORMAL HIGH (ref 6–20)
CALCIUM: 9.4 mg/dL (ref 8.9–10.3)
CO2: 29 mmol/L (ref 22–32)
Chloride: 103 mmol/L (ref 101–111)
Creatinine, Ser: 1.14 mg/dL (ref 0.61–1.24)
GFR calc Af Amer: >60 mL/min (ref >60)
GFR calc non Af Amer: >60 mL/min (ref >60)
Glucose, Bld: 157 mg/dL — ABNORMAL HIGH (ref 65–99)
Potassium: 3.5 mmol/L (ref 3.5–5.1)
SODIUM: 141 mmol/L (ref 135–145)
TOTAL PROTEIN: 7.2 g/dL (ref 6.5–8.1)

## 2014-12-17 LAB — CBC
HCT: 40.9 % (ref 39.0–52.0)
HEMOGLOBIN: 14 g/dL (ref 13.0–17.0)
MCH: 29.2 pg (ref 26.0–34.0)
MCHC: 34.2 g/dL (ref 30.0–36.0)
MCV: 85.4 fL (ref 78.0–100.0)
Platelets: 199 10*3/uL (ref 150–400)
RBC: 4.79 MIL/uL (ref 4.22–5.81)
RDW: 13.3 % (ref 11.5–15.5)
WBC: 6.6 10*3/uL (ref 4.0–10.5)

## 2014-12-17 LAB — URINALYSIS, ROUTINE W REFLEX MICROSCOPIC
Bilirubin Urine: NEGATIVE
GLUCOSE, UA: NEGATIVE mg/dL
Ketones, ur: NEGATIVE mg/dL
Nitrite: NEGATIVE
PROTEIN: NEGATIVE mg/dL
Specific Gravity, Urine: 1.03 (ref 1.005–1.030)
Urobilinogen, UA: 0.2 mg/dL (ref 0.0–1.0)
pH: 6.5 (ref 5.0–8.0)

## 2014-12-17 LAB — URINE MICROSCOPIC-ADD ON

## 2014-12-17 MED ORDER — SODIUM CHLORIDE 0.9 % IV SOLN
INTRAVENOUS | Status: DC
  Administered 2014-12-17: 03:00:00 via INTRAVENOUS

## 2014-12-17 MED ORDER — CIPROFLOXACIN HCL 500 MG PO TABS: 500.0000 mg | ORAL_TABLET | Freq: Two times a day (BID) | ORAL | Status: DC

## 2014-12-17 MED ORDER — ONDANSETRON HCL 4 MG/2ML IJ SOLN
4.0000 mg | Freq: Once | INTRAMUSCULAR | Status: AC | PRN
  Administered 2014-12-17: 4 mg via INTRAVENOUS
  Filled 2014-12-17: qty 2

## 2014-12-17 MED ORDER — KETOROLAC TROMETHAMINE 30 MG/ML IJ SOLN
30.0000 mg | Freq: Once | INTRAMUSCULAR | Status: AC
  Administered 2014-12-17: 30 mg via INTRAVENOUS

## 2014-12-17 MED ORDER — MORPHINE SULFATE 4 MG/ML IJ SOLN
4.0000 mg | Freq: Once | INTRAMUSCULAR | Status: AC
  Administered 2014-12-17: 4 mg via INTRAVENOUS
  Filled 2014-12-17: qty 1

## 2014-12-17 MED ORDER — IBUPROFEN 800 MG PO TABS: 800.0000 mg | ORAL_TABLET | Freq: Three times a day (TID) | ORAL | Status: DC | PRN

## 2014-12-17 MED ORDER — CIPROFLOXACIN HCL 500 MG PO TABS
500.0000 mg | ORAL_TABLET | Freq: Once | ORAL | Status: AC
  Administered 2014-12-17: 500 mg via ORAL
  Filled 2014-12-17: qty 1

## 2014-12-17 MED ORDER — KETOROLAC TROMETHAMINE 30 MG/ML IJ SOLN
INTRAMUSCULAR | Status: AC
  Administered 2014-12-17: 30 mg via INTRAVENOUS
  Filled 2014-12-17: qty 1

## 2014-12-17 MED ORDER — OXYCODONE-ACETAMINOPHEN 5-325 MG PO TABS: 1.0000 | ORAL_TABLET | Freq: Four times a day (QID) | ORAL | Status: DC | PRN

## 2014-12-17 MED ORDER — ONDANSETRON 4 MG PO TBDP: 4.0000 mg | ORAL_TABLET | Freq: Three times a day (TID) | ORAL | Status: DC | PRN

## 2014-12-17 NOTE — ED Notes (Signed)
Bed: WA06 Expected date:  Expected time:  Means of arrival:  Comments: TR2 

## 2014-12-17 NOTE — ED Notes (Signed)
Pt ambulating independently w/ steady gait on d/c in no acute distress, A&Ox4. D/c instructions reviewed w/ pt and family - pt and family deny any further questions or concerns at present. Rx given x2  

## 2014-12-17 NOTE — ED Notes (Signed)
Pt reports acute onset L flank pain approx 0200 - pt admits to hx of kidney stones. Denies any hematuria or fever.

## 2014-12-17 NOTE — ED Provider Notes (Signed)
TIME SEEN: 2:20 AM  CHIEF COMPLAINT: Left flank pain  HPI: Pt is a 49 y.o. male with history of hypertension, hyperlipidemia, BPH, prior history of kidney stone 12 years ago that did not require intervention he presents emergency room with left flank pain that started at 2 AM. Feels similar to his prior kidney stones. Has had nausea and vomiting. No diarrhea. No abdominal pain. No dysuria or hematuria. No fever.  ROS: See HPI Constitutional: no fever  Eyes: no drainage  ENT: no runny nose   Cardiovascular:  no chest pain  Resp: no SOB  GI:  vomiting GU: no dysuria Integumentary: no rash  Allergy: no hives  Musculoskeletal: no leg swelling  Neurological: no slurred speech ROS otherwise negative  PAST MEDICAL HISTORY/PAST SURGICAL HISTORY:  Past Medical History  Diagnosis Date  . Hyperlipidemia   . GERD (gastroesophageal reflux disease)   . Hypertension   . BPH (benign prostatic hyperplasia)   . Plantar fasciitis     cortisone injection  . Kidney stone     MEDICATIONS:  Prior to Admission medications   Medication Sig Start Date End Date Taking? Authorizing Provider  aspirin 81 MG tablet Take 81 mg by mouth daily.      Historical Provider, MD  azelastine (ASTELIN) 0.1 % nasal spray Place 1 spray into both nostrils 2 (two) times daily. Use in each nostril as directed 10/02/14   Marin Olp, MD  clonazePAM (KLONOPIN) 0.5 MG tablet Take 1 tablet (0.5 mg total) by mouth at bedtime as needed. 09/26/11   Ricard Dillon, MD  diazepam (VALIUM) 10 MG tablet Take 1 tablet (10 mg total) by mouth every 8 (eight) hours as needed. 10/02/14   Marin Olp, MD  doxazosin (CARDURA) 2 MG tablet Take 1 tablet (2 mg total)  by mouth at bedtime. 07/16/14   Marin Olp, MD  fish oil-omega-3 fatty acids 1000 MG capsule Take 2 g by mouth daily.      Historical Provider, MD  folic acid (FOLVITE) 1 MG tablet Take 1 tablet (1 mg total)  by mouth daily. 07/16/14   Marin Olp, MD   HYDROcodone-acetaminophen (LORCET) 5-325 MG per tablet Take 1 tablet by mouth every 6 (six) hours as needed for moderate pain (as needed for back spasms). 10/02/14   Marin Olp, MD  Inositol Niacinate (NIACIN FLUSH FREE) 500 MG CAPS Take 1 capsule (500 mg total) by mouth 2 (two) times daily after a meal. 04/19/13   Ricard Dillon, MD  losartan-hydrochlorothiazide (HYZAAR) 50-12.5 MG per tablet Take 1 tablet by mouth daily. 07/16/14   Marin Olp, MD  Oxycodone-Acetaminophen (PERCOCET PO) Take 325 mg by mouth.    Historical Provider, MD  RABEprazole (ACIPHEX) 20 MG tablet Take 1 tablet (20 mg total) by mouth daily. 07/16/14   Marin Olp, MD  rosuvastatin (CRESTOR) 10 MG tablet Take 1 tablet by mouth  every day 07/16/14   Marin Olp, MD    ALLERGIES:  No Known Allergies  SOCIAL HISTORY:  History  Substance Use Topics  . Smoking status: Never Smoker   . Smokeless tobacco: Not on file  . Alcohol Use: Yes     Comment: 3 per month    FAMILY HISTORY: Family History  Problem Relation Age of Onset  . Lung cancer Father     former smoker  . Heart disease Father 6    PAD (carotid endarterectomy at 69) and CAD with MI in late 54s  early 61s.   . Heart disease Sister 43    mi, smoker  . Crohn's disease Daughter   . Stroke Paternal Grandfather 85  . Stroke Brother 76    physician who stopped practicing as result    EXAM: BP 147/79 mmHg  Pulse 90  Temp(Src) 97.9 F (36.6 C) (Oral)  Resp 24  SpO2 99% CONSTITUTIONAL: Alert and oriented and responds appropriately to questions. Appears uncomfortable, nontoxic, afebrile HEAD: Normocephalic EYES: Conjunctivae clear, PERRL ENT: normal nose; no rhinorrhea; moist mucous membranes; pharynx without lesions noted NECK: Supple, no meningismus, no LAD  CARD: RRR; S1 and S2 appreciated; no murmurs, no clicks, no rubs, no gallops RESP: Normal chest excursion without splinting or tachypnea; breath sounds clear and equal  bilaterally; no wheezes, no rhonchi, no rales, no hypoxia or respiratory distress, speaking full sentences ABD/GI: Normal bowel sounds; non-distended; soft, non-tender, no rebound, no guarding, no peritoneal signs BACK:  The back appears normal and is non-tender to palpation, patient has left CVA tenderness EXT: Normal ROM in all joints; non-tender to palpation; no edema; normal capillary refill; no cyanosis, no calf tenderness or swelling    SKIN: Normal color for age and race; warm NEURO: Moves all extremities equally, sensation to light touch intact diffusely, cranial nerves II through XII intact PSYCH: The patient's mood and manner are appropriate. Grooming and personal hygiene are appropriate.  MEDICAL DECISION MAKING: Patient here with left flank pain similar to his prior kidney stones. Will obtain labs, urine. This time I do not feel he needs repeat imaging has confirmed history ofkidney stones in the past. Will treat with Toradol, morphine, Zofran and IV fluids.  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  ED PROGRESS: Patient's labs are unremarkable. Urine shows hemoglobin, leukocytes and bacteria. We'll send urine culture. We'll discharge on Cipro for possible UTI. Suspect kidney stone as well but his pain has been well controlled after Toradol and morphine and he is feeling much better. I do not feel he needs emergent imaging at this time as I do not feel he will change his disposition. I feel he is safe to be discharged home with close outpatient follow-up with his urologist. [facility] discharge him with pain medication, nausea medicine. Patient is RD on Cardura for his BPH. Discussed return precautions. He verbalized understanding and is comfortable with this plan.     [place], DO 12/17/14 [phone]

## 2014-12-17 NOTE — Discharge Instructions (Signed)
Dietary Guidelines to Help Prevent Kidney Stones  Your risk of kidney stones can be decreased by adjusting the foods you eat. The most important thing you can do is drink enough fluid. You should drink enough fluid to keep your urine clear or pale yellow. The following guidelines provide specific information for the type of kidney stone you have had.  GUIDELINES ACCORDING TO TYPE OF KIDNEY STONE  Calcium Oxalate Kidney Stones   Reduce the amount of salt you eat. Foods that have a lot of salt cause your body to release excess calcium into your urine. The excess calcium can combine with a substance called oxalate to form kidney stones.   Reduce the amount of animal protein you eat if the amount you eat is excessive. Animal protein causes your body to release excess calcium into your urine. Ask your dietitian how much protein from animal sources you should be eating.   Avoid foods that are high in oxalates. If you take vitamins, they should have less than 500 mg of vitamin C. Your body turns vitamin C into oxalates. You do not need to avoid fruits and vegetables high in vitamin C.  Calcium Phosphate Kidney Stones   Reduce the amount of salt you eat to help prevent the release of excess calcium into your urine.   Reduce the amount of animal protein you eat if the amount you eat is excessive. Animal protein causes your body to release excess calcium into your urine. Ask your dietitian how much protein from animal sources you should be eating.   Get enough calcium from food or take a calcium supplement (ask your dietitian for recommendations). Food sources of calcium that do not increase your risk of kidney stones include:   Broccoli.   Dairy products, such as cheese and yogurt.   Pudding.  Uric Acid Kidney Stones   Do not have more than 6 oz of animal protein per day.  FOOD SOURCES  Animal Protein Sources   Meat (all types).   Poultry.   Eggs.   Fish, seafood.  Foods High in Salt   Salt seasonings.   Soy  sauce.   Teriyaki sauce.   Cured and processed meats.   Salted crackers and snack foods.   Fast food.   Canned soups and most canned foods.  Foods High in Oxalates   Grains:   Amaranth.   Barley.   Grits.   Wheat germ.   Bran.   Buckwheat flour.   All bran cereals.   Pretzels.   Whole wheat bread.   Vegetables:   Beans (wax).   Beets and beet greens.   Collard greens.   Eggplant.   Escarole.   Leeks.   Okra.   Parsley.   Rutabagas.   Spinach.   Swiss chard.   Tomato paste.   Fried potatoes.   Sweet potatoes.   Fruits:   Red currants.   Figs.   Kiwi.   Rhubarb.   Meat and Other Protein Sources:   Beans (dried).   Soy burgers and other soybean products.   Miso.   Nuts (peanuts, almonds, pecans, cashews, hazelnuts).   Nut butters.   Sesame seeds and tahini (paste made of sesame seeds).   Poppy seeds.   Beverages:   Chocolate drink mixes.   Soy milk.   Instant iced tea.   Juices made from high-oxalate fruits or vegetables.   Other:   Carob.   Chocolate.   Fruitcake.   Marmalades.  Document Released:   09/17/2010 Document Revised: 05/28/2013 Document Reviewed: 04/19/2013  [employer] Patient Information 2015 [employer]. This information is not intended to replace advice given to you by your health care provider. Make sure you discuss any questions you have with your health care provider.  Kidney Stones  Kidney stones (urolithiasis) are deposits that form inside your kidneys. The intense pain is caused by the stone moving through the urinary tract. When the stone moves, the ureter goes into spasm around the stone. The stone is usually passed in the urine.   CAUSES    A disorder that makes certain neck glands produce too much parathyroid hormone (primary hyperparathyroidism).   A buildup of uric acid crystals, similar to gout in your joints.   Narrowing (stricture) of the ureter.   A kidney obstruction present at birth (congenital obstruction).   Previous surgery on  the kidney or ureters.   Numerous kidney infections.  SYMPTOMS    Feeling sick to your stomach (nauseous).   Throwing up (vomiting).   Blood in the urine (hematuria).   Pain that usually spreads (radiates) to the groin.   Frequency or urgency of urination.  DIAGNOSIS    Taking a history and physical exam.   Blood or urine tests.   CT scan.   Occasionally, an examination of the inside of the urinary bladder (cystoscopy) is performed.  TREATMENT    Observation.   Increasing your fluid intake.   Extracorporeal shock wave lithotripsy--This is a noninvasive procedure that uses shock waves to break up kidney stones.   Surgery may be needed if you have severe pain or persistent obstruction. There are various surgical procedures. Most of the procedures are performed with the use of small instruments. Only small incisions are needed to accommodate these instruments, so recovery time is minimized.  The size, location, and chemical composition are all important variables that will determine the proper choice of action for you. Talk to your health care provider to better understand your situation so that you will minimize the risk of injury to yourself and your kidney.   HOME CARE INSTRUCTIONS    Drink enough water and fluids to keep your urine clear or pale yellow. This will help you to pass the stone or stone fragments.   Strain all urine through the provided strainer. Keep all particulate matter and stones for your health care provider to see. The stone causing the pain may be as small as a grain of salt. It is very important to use the strainer each and every time you pass your urine. The collection of your stone will allow your health care provider to analyze it and verify that a stone has actually passed. The stone analysis will often identify what you can do to reduce the incidence of recurrences.   Only take over-the-counter or prescription medicines for pain, discomfort, or fever as directed by your  health care provider.   Make a follow-up appointment with your health care provider as directed.   Get follow-up X-rays if required. The absence of pain does not always mean that the stone has passed. It may have only stopped moving. If the urine remains completely obstructed, it can cause loss of kidney function or even complete destruction of the kidney. It is your responsibility to make sure X-rays and follow-ups are completed. Ultrasounds of the kidney can show blockages and the status of the kidney. Ultrasounds are not associated with any radiation and can be performed easily in a matter of minutes.  SEEK   Released: 05/23/2005 Document Revised: 01/23/2013 Document Reviewed: 10/24/2012 [facility] Patient Information 2015 [place], [place]. This information is not intended to replace advice given to you by your health care provider. Make sure you discuss any questions you have with your health care provider.  Urinary Tract Infection Urinary tract infections (UTIs) can develop anywhere along your urinary tract. Your urinary tract is your body's drainage system for removing wastes and extra water. Your urinary tract includes two kidneys, two ureters, a bladder, and a urethra. Your kidneys are a pair of bean-shaped organs. Each kidney is about the size of your fist. They are located below your ribs, one on  each side of your spine. CAUSES Infections are caused by microbes, which are microscopic organisms, including fungi, viruses, and bacteria. These organisms are so small that they can only be seen through a microscope. Bacteria are the microbes that most commonly cause UTIs. SYMPTOMS  Symptoms of UTIs may vary by age and gender of the patient and by the location of the infection. Symptoms in young women typically include a frequent and intense urge to urinate and a painful, burning feeling in the bladder or urethra during urination. Older women and men are more likely to be tired, shaky, and weak and have muscle aches and abdominal pain. A fever may mean the infection is in your kidneys. Other symptoms of a kidney infection include pain in your back or sides below the ribs, nausea, and vomiting. DIAGNOSIS To diagnose a UTI, your caregiver will ask you about your symptoms. Your caregiver also will ask to provide a urine sample. The urine sample will be tested for bacteria and white blood cells. White blood cells are made by your body to help fight infection. TREATMENT  Typically, UTIs can be treated with medication. Because most UTIs are caused by a bacterial infection, they usually can be treated with the use of antibiotics. The choice of antibiotic and length of treatment depend on your symptoms and the type of bacteria causing your infection. HOME CARE INSTRUCTIONS  If you were prescribed antibiotics, take them exactly as your caregiver instructs you. Finish the medication even if you feel better after you have only taken some of the medication.  Drink enough water and fluids to keep your urine clear or pale yellow.  Avoid caffeine, tea, and carbonated beverages. They tend to irritate your bladder.  Empty your bladder often. Avoid holding urine for long periods of time.  Empty your bladder before and after sexual intercourse.  After a bowel movement, women should cleanse from front to back. Use  each tissue only once. SEEK MEDICAL CARE IF:   You have back pain.  You develop a fever.  Your symptoms do not begin to resolve within 3 days. SEEK IMMEDIATE MEDICAL CARE IF:   You have severe back pain or lower abdominal pain.  You develop chills.  You have nausea or vomiting.  You have continued burning or discomfort with urination. MAKE SURE YOU:   Understand these instructions.  Will watch your condition.  Will get help right away if you are not doing well or get worse. Document Released: 03/02/2005 Document Revised: 11/22/2011 Document Reviewed: 07/01/2011 [facility] Patient Information 2015 [place], [place]. This information is not intended to replace advice given to you by your health care provider. Make sure you discuss any questions you have with your health care provider.

## 2014-12-17 NOTE — ED Notes (Signed)
Pt states having L flank pain started tonight, hx of kidney stones. Pt having n/v in triage

## 2014-12-18 LAB — URINE CULTURE: Culture: NO GROWTH

## 2014-12-19 ENCOUNTER — Other Ambulatory Visit: Payer: Self-pay | Admitting: Family Medicine

## 2015-01-01 ENCOUNTER — Other Ambulatory Visit (INDEPENDENT_AMBULATORY_CARE_PROVIDER_SITE_OTHER): Payer: 59

## 2015-01-01 DIAGNOSIS — Z0000 Encounter for general adult medical examination without abnormal findings: Secondary | ICD-10-CM

## 2015-01-01 LAB — LIPID PANEL
Cholesterol: 106 mg/dL (ref 0–200)
HDL: 32.2 mg/dL — ABNORMAL LOW (ref >39.00)
LDL Cholesterol: 61 mg/dL (ref 0–99)
NONHDL: 73.71
Total CHOL/HDL Ratio: 3
Triglycerides: 63 mg/dL (ref 0.0–149.0)
VLDL: 12.6 mg/dL (ref 0.0–40.0)

## 2015-01-01 LAB — HEMOGLOBIN A1C: Hgb A1c MFr Bld: 5.6 % (ref 4.6–6.5)

## 2015-01-02 LAB — HIV ANTIBODY (ROUTINE TESTING W REFLEX): HIV 1&2 Ab, 4th Generation: NONREACTIVE

## 2015-03-22 ENCOUNTER — Other Ambulatory Visit: Payer: Self-pay | Admitting: Family Medicine

## 2015-03-27 ENCOUNTER — Encounter: Payer: Self-pay | Admitting: Family Medicine

## 2015-04-03 ENCOUNTER — Encounter: Payer: Self-pay | Admitting: Family Medicine

## 2015-04-03 ENCOUNTER — Ambulatory Visit (INDEPENDENT_AMBULATORY_CARE_PROVIDER_SITE_OTHER): Payer: 59 | Admitting: Family Medicine

## 2015-04-03 ENCOUNTER — Ambulatory Visit: Payer: 59 | Admitting: Family Medicine

## 2015-04-03 VITALS — BP 122/72 | HR 79 | Temp 98.4°F | Wt 266.0 lb

## 2015-04-03 DIAGNOSIS — I10 Essential (primary) hypertension: Secondary | ICD-10-CM | POA: Diagnosis not present

## 2015-04-03 DIAGNOSIS — E785 Hyperlipidemia, unspecified: Secondary | ICD-10-CM | POA: Diagnosis not present

## 2015-04-03 DIAGNOSIS — Z1211 Encounter for screening for malignant neoplasm of colon: Secondary | ICD-10-CM | POA: Diagnosis not present

## 2015-04-03 DIAGNOSIS — M1990 Unspecified osteoarthritis, unspecified site: Secondary | ICD-10-CM | POA: Diagnosis not present

## 2015-04-03 MED ORDER — CELECOXIB 100 MG PO CAPS: 100.0000 mg | ORAL_CAPSULE | Freq: Two times a day (BID) | ORAL | Status: DC | PRN

## 2015-04-03 NOTE — Assessment & Plan Note (Signed)
S: controlled. on Doxazosin, losartin-hctz 50-12.5mg .  BP Readings from Last 3 Encounters:  04/03/15 122/72  12/17/14 121/76  10/02/14 98/60  A/P:Continue current meds

## 2015-04-03 NOTE — Progress Notes (Addendum)
Garret Reddish, MD  Subjective:  Andrew Wilkins is a 49 y.o. year old very pleasant male patient who presents for/with See problem oriented charting ROS- No chest pain or shortness of breath. No headache or blurry vision. Bilateral hip pain noted. Back has improved  Past Medical History-  Patient Active Problem List   Diagnosis Date Noted  . BPH (benign prostatic hyperplasia) 04/02/2014    Priority: Medium  . Family history of peripheral vascular disease 03/17/2011    Priority: Medium  . Hyperlipidemia 02/26/2007    Priority: Medium  . INSOMNIA, PERSISTENT 02/26/2007    Priority: Medium  . Essential hypertension 02/26/2007    Priority: Medium  . Hemangioma of liver 04/02/2014    Priority: Low  . Nephrolithiasis 04/02/2014    Priority: Low  . Lipoma 07/08/2009    Priority: Low  . GERD 06/18/2007    Priority: Low  . Osteoarthritis 02/26/2007    Priority: Low    Medications- reviewed and updated Current Outpatient Prescriptions  Medication Sig Dispense Refill  . aspirin EC 81 MG tablet Take 81 mg by mouth daily.    Marland Kitchen azelastine (ASTELIN) 0.1 % nasal spray Place 1 spray into both nostrils 2 (two) times daily. Use in each nostril as directed (Patient taking differently: Place 1 spray into both nostrils 2 (two) times daily as needed for rhinitis (seasonal rhinitis). Use in each nostril as directed) 30 mL 11  . doxazosin (CARDURA) 2 MG tablet Take 1 tablet (2 mg total)  by mouth at bedtime. 90 tablet 3  . fish oil-omega-3 fatty acids 1000 MG capsule Take 2 g by mouth daily.      . folic acid (FOLVITE) 1 MG tablet Take 1 tablet (1 mg total)  by mouth daily. 90 tablet 3  . ibuprofen (ADVIL,MOTRIN) 800 MG tablet Take 1 tablet (800 mg total) by mouth every 8 (eight) hours as needed for mild pain. 30 tablet 0  . losartan-hydrochlorothiazide (HYZAAR) 50-12.5 MG tablet Take 1 tablet by mouth  daily 90 tablet 0  . Multiple Vitamin (MULTIVITAMIN WITH MINERALS) TABS tablet Take 1 tablet by  mouth daily.    . RABEprazole (ACIPHEX) 20 MG tablet Take 1 tablet (20 mg total) by mouth daily. 90 tablet 3  . rosuvastatin (CRESTOR) 10 MG tablet Take 1 tablet by mouth  every day (Patient taking differently: Take 10 mg by mouth every other day. Take 1 tablet by mouth  every day) 90 tablet 3  . diazepam (VALIUM) 10 MG tablet Take 1 tablet (10 mg total) by mouth every 8 (eight) hours as needed. (Patient not taking: Reported on 04/03/2015) 30 tablet 0  . HYDROcodone-acetaminophen (LORCET) 5-325 MG per tablet Take 1 tablet by mouth every 6 (six) hours as needed for moderate pain (as needed for back spasms). (Patient not taking: Reported on 04/03/2015) 20 tablet 0  . ondansetron (ZOFRAN ODT) 4 MG disintegrating tablet Take 1 tablet (4 mg total) by mouth every 8 (eight) hours as needed for nausea or vomiting. (Patient not taking: Reported on 04/03/2015) 20 tablet 0   No current facility-administered medications for this visit.    Objective: BP 122/72 mmHg  Pulse 79  Temp(Src) 98.4 F (36.9 C)  Wt 266 lb (120.657 kg) Gen: NAD, resting comfortably CV: RRR no murmurs rubs or gallops Lungs: CTAB no crackles, wheeze, rhonchi Abdomen: soft/nontender/nondistended/normal bowel sounds. No rebound or guarding. obese Ext: trace edema MSK: appears to have slight limp Skin: warm, dry Neuro: grossly normal, moves all extremities  Assessment/Plan:  Essential hypertension S: controlled. on Doxazosin, losartin-hctz 50-12.5mg .  BP Readings from Last 3 Encounters:  04/03/15 122/72  12/17/14 121/76  10/02/14 98/60  A/P:Continue current meds   Hyperlipidemia S: controlled. On crestor 10mg  and niacin at last lipid check. Had thought that myalgias were due to statin in past but actually was due to low back and hips- after PT- symptoms resolved and back on full dose A/P:Continue current medications as myalgias have resolved. Weight trending up though and advised weight loss   Osteoarthritis S:Rehab  back through PT which is now better- now OA hip bilateral discovered. Severe enough injections wont help. Lateral movement in and out of car or getting down to ground. R > L.  A/P: patient does have elevated cardiac risk and family history. NSAIDS very helpful but did not opt to use these until discussed with medical doctor. After logn discussion- patient worried about ulcer and heart risk- we opted for celebrex 100mg  BID prn. At such low dose hope to avoid cardiac effects.    6 month planned Return precautions advised.   Orders Placed This Encounter  Procedures  . Ambulatory referral to Gastroenterology    Referral Priority:  Routine    Referral Type:  Consultation    Referral Reason:  Specialty Services Required    Number of Visits Requested:  1  per patient request Jeff Medoff as first option [facility] as back up- Andrew Wilkins  Meds ordered this encounter  Medications  . celecoxib (CELEBREX) 100 MG capsule    Sig: Take 1 capsule (100 mg total) by mouth 2 (two) times daily as needed.    Dispense:  60 capsule    Refill:  5

## 2015-04-03 NOTE — Assessment & Plan Note (Addendum)
S: controlled. On crestor 10mg  and niacin at last lipid check. Had thought that myalgias were due to statin in past but actually was due to low back and hips- after PT- symptoms resolved and back on full dose A/P:Continue current medications as myalgias have resolved. Weight trending up though and advised weight loss

## 2015-04-03 NOTE — Patient Instructions (Signed)
Glad the back is better but sorry to hear about the hip! Let's try sparing celebrex 100mg - hopefully at low dose will not affect heart risk or ulcer risk  Try to get you in for colonoscopy in December  Blood pressure looks great  Cholesterol looks great- continue crestor 10mg   Agree with you- need to find a way to reverse weight trend Wt Readings from Last 3 Encounters:  04/03/15 266 lb (120.657 kg)  10/02/14 263 lb (119.296 kg)  04/02/14 250 lb (113.399 kg)

## 2015-04-03 NOTE — Assessment & Plan Note (Signed)
S:Rehab back through PT which is now better- now OA hip bilateral discovered. Severe enough injections wont help. Lateral movement in and out of car or getting down to ground. R > L.  A/P: patient does have elevated cardiac risk and family history. NSAIDS very helpful but did not opt to use these until discussed with medical doctor. After logn discussion- patient worried about ulcer and heart risk- we opted for celebrex 100mg  BID prn. At such low dose hope to avoid cardiac effects.

## 2015-07-06 ENCOUNTER — Other Ambulatory Visit: Payer: Self-pay | Admitting: Family Medicine

## 2015-07-07 ENCOUNTER — Other Ambulatory Visit: Payer: Self-pay | Admitting: Family Medicine

## 2015-07-07 MED ORDER — CELECOXIB 100 MG PO CAPS: 100.0000 mg | ORAL_CAPSULE | Freq: Two times a day (BID) | ORAL | Status: DC | PRN

## 2015-07-10 ENCOUNTER — Other Ambulatory Visit: Payer: Self-pay | Admitting: Family Medicine

## 2015-07-17 ENCOUNTER — Other Ambulatory Visit: Payer: Self-pay | Admitting: Family Medicine

## 2015-08-03 ENCOUNTER — Other Ambulatory Visit: Payer: Self-pay | Admitting: Family Medicine

## 2015-08-03 MED ORDER — ROSUVASTATIN CALCIUM 10 MG PO TABS: ORAL_TABLET | ORAL | Status: DC

## 2015-08-09 ENCOUNTER — Encounter: Payer: Self-pay | Admitting: Family Medicine

## 2015-08-10 ENCOUNTER — Encounter: Payer: Self-pay | Admitting: Family Medicine

## 2015-08-10 ENCOUNTER — Other Ambulatory Visit: Payer: Self-pay

## 2015-08-10 LAB — PSA: PSA: 8.65

## 2015-08-12 ENCOUNTER — Other Ambulatory Visit: Payer: Self-pay | Admitting: Family Medicine

## 2015-08-22 ENCOUNTER — Encounter: Payer: Self-pay | Admitting: Family Medicine

## 2015-08-24 ENCOUNTER — Other Ambulatory Visit: Payer: Self-pay

## 2015-08-24 MED ORDER — ROSUVASTATIN CALCIUM 10 MG PO TABS: ORAL_TABLET | ORAL | Status: DC

## 2015-10-05 ENCOUNTER — Encounter: Payer: Self-pay | Admitting: Family Medicine

## 2015-10-05 ENCOUNTER — Ambulatory Visit (INDEPENDENT_AMBULATORY_CARE_PROVIDER_SITE_OTHER): Payer: 59 | Admitting: Family Medicine

## 2015-10-05 VITALS — BP 118/74 | HR 76 | Temp 97.7°F | Ht 74.0 in | Wt 265.0 lb

## 2015-10-05 DIAGNOSIS — I10 Essential (primary) hypertension: Secondary | ICD-10-CM | POA: Diagnosis not present

## 2015-10-05 DIAGNOSIS — M1990 Unspecified osteoarthritis, unspecified site: Secondary | ICD-10-CM | POA: Diagnosis not present

## 2015-10-05 DIAGNOSIS — E785 Hyperlipidemia, unspecified: Secondary | ICD-10-CM

## 2015-10-05 DIAGNOSIS — R739 Hyperglycemia, unspecified: Secondary | ICD-10-CM | POA: Diagnosis not present

## 2015-10-05 LAB — COMPREHENSIVE METABOLIC PANEL
ALK PHOS: 62 U/L (ref 39–117)
ALT: 34 U/L (ref 0–53)
AST: 20 U/L (ref 0–37)
Albumin: 4.7 g/dL (ref 3.5–5.2)
BUN: 27 mg/dL — AB (ref 6–23)
CO2: 27 mEq/L (ref 19–32)
Calcium: 9.6 mg/dL (ref 8.4–10.5)
Chloride: 106 mEq/L (ref 96–112)
Creatinine, Ser: 0.89 mg/dL (ref 0.40–1.50)
GFR: 95.9 mL/min (ref >60.00)
Glucose, Bld: 92 mg/dL (ref 70–99)
POTASSIUM: 3.9 meq/L (ref 3.5–5.1)
SODIUM: 140 meq/L (ref 135–145)
TOTAL PROTEIN: 7 g/dL (ref 6.0–8.3)
Total Bilirubin: 0.7 mg/dL (ref 0.2–1.2)

## 2015-10-05 LAB — CBC
HEMATOCRIT: 43.2 % (ref 39.0–52.0)
Hemoglobin: 14.7 g/dL (ref 13.0–17.0)
MCHC: 34 g/dL (ref 30.0–36.0)
MCV: 86.2 fl (ref 78.0–100.0)
PLATELETS: 217 10*3/uL (ref 150.0–400.0)
RBC: 5.02 Mil/uL (ref 4.22–5.81)
RDW: 13.3 % (ref 11.5–15.5)
WBC: 5.1 10*3/uL (ref 4.0–10.5)

## 2015-10-05 LAB — HEMOGLOBIN A1C: Hgb A1c MFr Bld: 5.7 % (ref 4.6–6.5)

## 2015-10-05 LAB — HDL CHOLESTEROL: HDL: 31.5 mg/dL — AB (ref >39.00)

## 2015-10-05 LAB — LDL CHOLESTEROL, DIRECT: Direct LDL: 56 mg/dL

## 2015-10-05 MED ORDER — TRAMADOL HCL 50 MG PO TABS: 50.0000 mg | ORAL_TABLET | Freq: Three times a day (TID) | ORAL | Status: DC | PRN

## 2015-10-05 MED ORDER — DIAZEPAM 10 MG PO TABS: 10.0000 mg | ORAL_TABLET | Freq: Three times a day (TID) | ORAL | Status: DC | PRN

## 2015-10-05 NOTE — Assessment & Plan Note (Signed)
Monitor today with a1c- peak 5.9 in past but 5.6 last check

## 2015-10-05 NOTE — Assessment & Plan Note (Signed)
S: controlled. On alfuzosin (changed from doxazosin by urology, losartan-hctz 50-12.5 mg  BP Readings from Last 3 Encounters:  10/05/15 118/74  04/03/15 122/72  12/17/14 121/76  A/P:Continue current meds:  Great control

## 2015-10-05 NOTE — Patient Instructions (Signed)
No changes in medication other than starting tramadol to see if we can avoid the celebrex as much as possible  Labs before you leave, I would check with [place] in a week to make sure we sent in your forms.   Hope you have a good visit with Dr. Ninfa Linden

## 2015-10-05 NOTE — Assessment & Plan Note (Signed)
S: well controlled on crestor 10mg . No myalgias.  Lab Results  Component Value Date   CHOL 106 01/01/2015   HDL 32.20* 01/01/2015   LDLCALC 61 01/01/2015   TRIG 63.0 01/01/2015   CHOLHDL 3 01/01/2015   A/P: update HDL and LDL as needs this for work form completion, suspect controlled

## 2015-10-05 NOTE — Progress Notes (Signed)
Subjective:  Andrew Wilkins is a 50 y.o. year old very pleasant male patient who presents for/with See problem oriented charting ROS- No chest pain or shortness of breath. No headache or blurry vision. Does have severe bilateral hip pain with walking.   Past Medical History-  Patient Active Problem List   Diagnosis Date Noted  . BPH (benign prostatic hyperplasia) 04/02/2014    Priority: Medium  . Family history of peripheral vascular disease 03/17/2011    Priority: Medium  . Hyperlipidemia 02/26/2007    Priority: Medium  . Essential hypertension 02/26/2007    Priority: Medium  . Osteoarthritis 02/26/2007    Priority: Medium  . Hemangioma of liver 04/02/2014    Priority: Low  . Nephrolithiasis 04/02/2014    Priority: Low  . Lipoma 07/08/2009    Priority: Low  . GERD 06/18/2007    Priority: Low  . INSOMNIA, PERSISTENT 02/26/2007    Priority: Low  . Hyperglycemia 10/05/2015    Medications- reviewed and updated Current Outpatient Prescriptions  Medication Sig Dispense Refill  . alfuzosin (UROXATRAL) 10 MG 24 hr tablet Take 10 mg by mouth daily with breakfast.    . aspirin EC 81 MG tablet Take 81 mg by mouth daily.    Marland Kitchen azelastine (ASTELIN) 0.1 % nasal spray Place 1 spray into both nostrils 2 (two) times daily. Use in each nostril as directed (Patient taking differently: Place 1 spray into both nostrils 2 (two) times daily as needed for rhinitis (seasonal rhinitis). Use in each nostril as directed) 30 mL 11  . celecoxib (CELEBREX) 100 MG capsule Take 1 capsule (100 mg total) by mouth 2 (two) times daily as needed. 60 capsule 5  . fish oil-omega-3 fatty acids 1000 MG capsule Take 2 g by mouth daily.      . folic acid (FOLVITE) 1 MG tablet Take 1 tablet by mouth  daily 90 tablet 2  . losartan-hydrochlorothiazide (HYZAAR) 50-12.5 MG tablet Take 1 tablet by mouth  daily 90 tablet 2  . Multiple Vitamin (MULTIVITAMIN WITH MINERALS) TABS tablet Take 1 tablet by mouth daily.    .  RABEprazole (ACIPHEX) 20 MG tablet Take 1 tablet by mouth  daily 90 tablet 2  . rosuvastatin (CRESTOR) 10 MG tablet Take 1 tablet by mouth  every day 30 tablet 2  . diazepam (VALIUM) 10 MG tablet Take 1 tablet (10 mg total) by mouth every 8 (eight) hours as needed. 30 tablet 0  . traMADol (ULTRAM) 50 MG tablet Take 1 tablet (50 mg total) by mouth every 8 (eight) hours as needed. 30 tablet 2   No current facility-administered medications for this visit.    Objective: BP 118/74 mmHg  Pulse 76  Temp(Src) 97.7 F (36.5 C)  Ht 6\' 2"  (1.88 m)  Wt 265 lb (120.203 kg)  BMI 34.01 kg/m2 Gen: NAD, resting comfortably CV: RRR no murmurs rubs or gallops Lungs: CTAB no crackles, wheeze, rhonchi Abdomen: soft/nontender/nondistended/normal bowel sounds. No rebound or guarding. obese Ext: no edema Skin: warm, dry Neuro: grossly normal, moves all extremities  Assessment/Plan:  Essential hypertension S: controlled. On alfuzosin (changed from doxazosin by urology, losartan-hctz 50-12.5 mg  BP Readings from Last 3 Encounters:  10/05/15 118/74  04/03/15 122/72  12/17/14 121/76  A/P:Continue current meds:  Great control  Hyperlipidemia S: well controlled on crestor 10mg . No myalgias.  Lab Results  Component Value Date   CHOL 106 01/01/2015   HDL 32.20* 01/01/2015   LDLCALC 61 01/01/2015   TRIG 63.0 01/01/2015  CHOLHDL 3 01/01/2015   A/P: update HDL and LDL as needs this for work form completion, suspect controlled  Osteoarthritis S: Severe bilateral hip disease.  Dr. Alvan Dame 1st opinion told him needed him replacement- did not trial injection. Going to [facility] Dr. Zollie Beckers 2nd opinion in coming weeks. Using very sparing celebrex if has to be really active but he is aware of concern with family history of peripheral vascular disease.  A/P: we opted to trial tramadol as tylenol has not been effective and want to avoid nsaids. He will use sparingly to see if can avoid the celebrex he  has. Also will have consult with orthopedics- likely to need replacement- may trial injection (will have to watch hyperglycemia)   Hyperglycemia Monitor today with a1c- peak 5.9 in past but 5.6 last check   Return in about 3 months (around 01/05/2016) for physical. Return precautions advised.   Orders Placed This Encounter  Procedures  . LDL cholesterol, direct    [place]  . HDL cholesterol  . CBC    [place]  . Comprehensive metabolic panel    [place]  . Hemoglobin A1c        Meds ordered this encounter  Medications  . diazepam (VALIUM) 10 MG tablet    Sig: Take 1 tablet (10 mg total) by mouth every 8 (eight) hours as needed.    Dispense:  30 tablet    Refill:  0  . traMADol (ULTRAM) 50 MG tablet    Sig: Take 1 tablet (50 mg total) by mouth every 8 (eight) hours as needed.    Dispense:  30 tablet    Refill:  2    Garret Reddish, MD

## 2015-10-05 NOTE — Assessment & Plan Note (Signed)
S: Severe bilateral hip disease.  Dr. Alvan Dame 1st opinion told him needed him replacement- did not trial injection. Going to [facility] Dr. Zollie Beckers 2nd opinion in coming weeks. Using very sparing celebrex if has to be really active but he is aware of concern with family history of peripheral vascular disease.  A/P: we opted to trial tramadol as tylenol has not been effective and want to avoid nsaids. He will use sparingly to see if can avoid the celebrex he has. Also will have consult with orthopedics- likely to need replacement- may trial injection (will have to watch hyperglycemia)

## 2015-11-17 ENCOUNTER — Other Ambulatory Visit: Payer: Self-pay | Admitting: Physician Assistant

## 2015-11-19 ENCOUNTER — Encounter: Payer: Self-pay | Admitting: Family Medicine

## 2015-11-25 ENCOUNTER — Other Ambulatory Visit (HOSPITAL_COMMUNITY): Payer: Self-pay | Admitting: *Deleted

## 2015-11-25 NOTE — Patient Instructions (Addendum)
Andrew Wilkins  11/25/2015   Your procedure is scheduled on: 12-04-15  Report to [facility]  Entrance take [facility]  elevators to 3rd floor to  [place] at 1245 pm  Call this number if you have problems the morning of surgery [phone]   Remember: ONLY 1 PERSON MAY GO WITH YOU TO SHORT STAY TO GET  READY MORNING OF YOUR SURGERY.   Do not eat food  :After Midnight, MAY HAVE CLEAR LIQUIDS FROM MIDNIGHT UNTIL 845 AM DAY OF SURGERY, NO CLEAR LIQUIDS OR ANYTHING BY MOUTH AFTER 845 AM DAY OF SURGERY.     Take these medicines the morning of surgery with A SIP OF WATER: ALFUZOSIN (UROXATRAL), DIAZEPAM (VALIUM) IF NEEDED, ACIPHEX, ROSUVASTATIN (CRESTOR)                               You may not have any metal on your body including hair pins and              piercings  Do not wear jewelry, make-up, lotions, powders or perfumes, deodorant             Do not wear nail polish.  Do not shave  48 hours prior to surgery.              Men may shave face and neck.   Do not bring valuables to the hospital. [place].  Contacts, dentures or bridgework may not be worn into surgery.  Leave suitcase in the car. After surgery it may be brought to your room.                  Please read over the following fact sheets you were given: _____________________________________________________________________             [facility] - Preparing for Surgery Before surgery, you can play an important role.  Because skin is not sterile, your skin needs to be as free of germs as possible.  You can reduce the number of germs on your skin by washing with CHG (chlorahexidine gluconate) soap before surgery.  CHG is an antiseptic cleaner which kills germs and bonds with the skin to continue killing germs even after washing. Please DO NOT use if you have an allergy to CHG or antibacterial soaps.  If your skin becomes reddened/irritated  stop using the CHG and inform your nurse when you arrive at Short Stay. Do not shave (including legs and underarms) for at least 48 hours prior to the first CHG shower.  You may shave your face/neck. Please follow these instructions carefully:  1.  Shower with CHG Soap the night before surgery and the  morning of Surgery.  2.  If you choose to wash your hair, wash your hair first as usual with your  normal  shampoo.  3.  After you shampoo, rinse your hair and body thoroughly to remove the  shampoo.                           4.  Use CHG as you would any other liquid soap.  You can apply chg directly  to the skin and wash  Gently with a scrungie or clean washcloth.  5.  Apply the CHG Soap to your body ONLY FROM THE NECK DOWN.   Do not use on face/ open                           Wound or open sores. Avoid contact with eyes, ears mouth and genitals (private parts).                       Wash face,  Genitals (private parts) with your normal soap.             6.  Wash thoroughly, paying special attention to the area where your surgery  will be performed.  7.  Thoroughly rinse your body with warm water from the neck down.  8.  DO NOT shower/wash with your normal soap after using and rinsing off  the CHG Soap.                9.  Pat yourself dry with a clean towel.            10.  Wear clean pajamas.            11.  Place clean sheets on your bed the night of your first shower and do not  sleep with pets. Day of Surgery : Do not apply any lotions/deodorants the morning of surgery.  Please wear clean clothes to the hospital/surgery center.  FAILURE TO FOLLOW THESE INSTRUCTIONS MAY RESULT IN THE CANCELLATION OF YOUR SURGERY PATIENT SIGNATURE_________________________________  NURSE SIGNATURE__________________________________  ________________________________________________________________________    CLEAR LIQUID DIET   Foods Allowed                                                                      Foods Excluded  Coffee and tea, regular and decaf                             liquids that you cannot  Plain Jell-O in any flavor                                             see through such as: Fruit ices (not with fruit pulp)                                     milk, soups, orange juice  Iced Popsicles                                    All solid food Carbonated beverages, regular and diet                                    Cranberry, grape and apple juices Sports drinks like Gatorade Lightly seasoned clear broth or consume(fat free) Sugar, honey syrup  Sample Menu Breakfast                                Lunch                                     Supper Cranberry juice                    Beef broth                            Chicken broth Jell-O                                     Grape juice                           Apple juice Coffee or tea                        Jell-O                                      Popsicle                                                Coffee or tea                        Coffee or tea  _____________________________________________________________________

## 2015-11-25 NOTE — Progress Notes (Signed)
HEMAGLOBIN A1C 10-05-15 EPIC

## 2015-11-26 ENCOUNTER — Encounter (HOSPITAL_COMMUNITY): Payer: Self-pay

## 2015-11-26 ENCOUNTER — Encounter (HOSPITAL_COMMUNITY)
Admission: RE | Admit: 2015-11-26 | Discharge: 2015-11-26 | Disposition: A | Discharge status: Home / Self Care | Payer: 59 | Source: Ambulatory Visit | Attending: Orthopaedic Surgery | Admitting: Orthopaedic Surgery

## 2015-11-26 DIAGNOSIS — Z0183 Encounter for blood typing: Secondary | ICD-10-CM | POA: Diagnosis not present

## 2015-11-26 DIAGNOSIS — Z01812 Encounter for preprocedural laboratory examination: Secondary | ICD-10-CM | POA: Diagnosis not present

## 2015-11-26 DIAGNOSIS — Z01818 Encounter for other preprocedural examination: Secondary | ICD-10-CM | POA: Diagnosis not present

## 2015-11-26 DIAGNOSIS — I10 Essential (primary) hypertension: Secondary | ICD-10-CM | POA: Insufficient documentation

## 2015-11-26 DIAGNOSIS — M160 Bilateral primary osteoarthritis of hip: Secondary | ICD-10-CM | POA: Insufficient documentation

## 2015-11-26 HISTORY — DX: Unspecified osteoarthritis, unspecified site: M19.90

## 2015-11-26 LAB — CBC
HCT: 39.7 % (ref 39.0–52.0)
HEMOGLOBIN: 13.9 g/dL (ref 13.0–17.0)
MCH: 29.4 pg (ref 26.0–34.0)
MCHC: 35 g/dL (ref 30.0–36.0)
MCV: 83.9 fL (ref 78.0–100.0)
PLATELETS: 208 10*3/uL (ref 150–400)
RBC: 4.73 MIL/uL (ref 4.22–5.81)
RDW: 12.8 % (ref 11.5–15.5)
WBC: 4.7 10*3/uL (ref 4.0–10.5)

## 2015-11-26 LAB — BASIC METABOLIC PANEL
Anion gap: 6 (ref 5–15)
BUN: 20 mg/dL (ref 6–20)
CHLORIDE: 107 mmol/L (ref 101–111)
CO2: 27 mmol/L (ref 22–32)
Calcium: 9.2 mg/dL (ref 8.9–10.3)
Creatinine, Ser: 0.77 mg/dL (ref 0.61–1.24)
GFR calc Af Amer: >60 mL/min (ref >60)
GFR calc non Af Amer: >60 mL/min (ref >60)
Glucose, Bld: 105 mg/dL — ABNORMAL HIGH (ref 65–99)
POTASSIUM: 3.7 mmol/L (ref 3.5–5.1)
SODIUM: 140 mmol/L (ref 135–145)

## 2015-11-26 LAB — SURGICAL PCR SCREEN
MRSA, PCR: NEGATIVE
Staphylococcus aureus: POSITIVE — AB

## 2015-11-26 LAB — ABO/RH: ABO/RH(D): B POS

## 2015-12-04 ENCOUNTER — Inpatient Hospital Stay (HOSPITAL_COMMUNITY): Payer: 59

## 2015-12-04 ENCOUNTER — Inpatient Hospital Stay (HOSPITAL_COMMUNITY): Payer: 59 | Admitting: Certified Registered Nurse Anesthetist

## 2015-12-04 ENCOUNTER — Encounter (HOSPITAL_COMMUNITY): Payer: Self-pay

## 2015-12-04 ENCOUNTER — Inpatient Hospital Stay (HOSPITAL_COMMUNITY)
Admission: RE | Admit: 2015-12-04 | Discharge: 2015-12-08 | DRG: 462 | Disposition: A | Discharge status: Home Health | Payer: 59 | Source: Ambulatory Visit | Attending: Orthopaedic Surgery | Admitting: Orthopaedic Surgery

## 2015-12-04 ENCOUNTER — Encounter (HOSPITAL_COMMUNITY)
Admission: RE | Disposition: A | Discharge status: Home Health | Payer: Self-pay | Source: Ambulatory Visit | Attending: Orthopaedic Surgery

## 2015-12-04 DIAGNOSIS — Z7982 Long term (current) use of aspirin: Secondary | ICD-10-CM

## 2015-12-04 DIAGNOSIS — Z87442 Personal history of urinary calculi: Secondary | ICD-10-CM | POA: Diagnosis not present

## 2015-12-04 DIAGNOSIS — I10 Essential (primary) hypertension: Secondary | ICD-10-CM | POA: Diagnosis present

## 2015-12-04 DIAGNOSIS — K219 Gastro-esophageal reflux disease without esophagitis: Secondary | ICD-10-CM | POA: Diagnosis present

## 2015-12-04 DIAGNOSIS — Z419 Encounter for procedure for purposes other than remedying health state, unspecified: Secondary | ICD-10-CM

## 2015-12-04 DIAGNOSIS — D62 Acute posthemorrhagic anemia: Secondary | ICD-10-CM | POA: Diagnosis not present

## 2015-12-04 DIAGNOSIS — I959 Hypotension, unspecified: Secondary | ICD-10-CM | POA: Diagnosis not present

## 2015-12-04 DIAGNOSIS — M25559 Pain in unspecified hip: Secondary | ICD-10-CM | POA: Diagnosis present

## 2015-12-04 DIAGNOSIS — E785 Hyperlipidemia, unspecified: Secondary | ICD-10-CM | POA: Diagnosis present

## 2015-12-04 DIAGNOSIS — M160 Bilateral primary osteoarthritis of hip: Secondary | ICD-10-CM | POA: Diagnosis present

## 2015-12-04 DIAGNOSIS — M1612 Unilateral primary osteoarthritis, left hip: Secondary | ICD-10-CM

## 2015-12-04 DIAGNOSIS — Z96643 Presence of artificial hip joint, bilateral: Secondary | ICD-10-CM

## 2015-12-04 HISTORY — PX: BILATERAL ANTERIOR TOTAL HIP ARTHROPLASTY: SHX5567

## 2015-12-04 LAB — TYPE AND SCREEN
ABO/RH(D): B POS
ANTIBODY SCREEN: NEGATIVE

## 2015-12-04 SURGERY — ARTHROPLASTY, HIP, BILATERAL, TOTAL, ANTERIOR APPROACH
Anesthesia: General | Site: Hip | Laterality: Bilateral

## 2015-12-04 MED ORDER — OXYCODONE HCL 5 MG PO TABS
5.0000 mg | ORAL_TABLET | ORAL | Status: DC | PRN
  Administered 2015-12-04 – 2015-12-05 (×9): 5 mg via ORAL
  Filled 2015-12-04: qty 1
  Filled 2015-12-04: qty 2
  Filled 2015-12-04: qty 1
  Filled 2015-12-04: qty 2
  Filled 2015-12-04 (×5): qty 1

## 2015-12-04 MED ORDER — SODIUM CHLORIDE 0.9 % IR SOLN
Status: DC | PRN
  Administered 2015-12-04 (×2): 1000 mL

## 2015-12-04 MED ORDER — OXYCODONE HCL 5 MG PO TABS: 5.0000 mg | ORAL_TABLET | Freq: Once | ORAL | Status: DC | PRN

## 2015-12-04 MED ORDER — CEFAZOLIN SODIUM-DEXTROSE 2-4 GM/100ML-% IV SOLN
2.0000 g | Freq: Four times a day (QID) | INTRAVENOUS | Status: AC
  Administered 2015-12-04 – 2015-12-05 (×2): 2 g via INTRAVENOUS
  Filled 2015-12-04 (×2): qty 100

## 2015-12-04 MED ORDER — DEXAMETHASONE SODIUM PHOSPHATE 10 MG/ML IJ SOLN
INTRAMUSCULAR | Status: DC | PRN
  Administered 2015-12-04: 10 mg via INTRAVENOUS

## 2015-12-04 MED ORDER — LACTATED RINGERS IV SOLN
INTRAVENOUS | Status: DC
  Administered 2015-12-04 (×4): via INTRAVENOUS

## 2015-12-04 MED ORDER — LIDOCAINE HCL (CARDIAC) 20 MG/ML IV SOLN
INTRAVENOUS | Status: DC | PRN
  Administered 2015-12-04: 100 mg via INTRAVENOUS

## 2015-12-04 MED ORDER — HYDROMORPHONE HCL 2 MG/ML IJ SOLN
INTRAMUSCULAR | Status: AC
  Filled 2015-12-04: qty 1

## 2015-12-04 MED ORDER — METHOCARBAMOL 1000 MG/10ML IJ SOLN
500.0000 mg | Freq: Four times a day (QID) | INTRAVENOUS | Status: DC | PRN
  Administered 2015-12-04: 500 mg via INTRAVENOUS
  Filled 2015-12-04 (×3): qty 5

## 2015-12-04 MED ORDER — SUGAMMADEX SODIUM 200 MG/2ML IV SOLN
INTRAVENOUS | Status: DC | PRN
  Administered 2015-12-04: 200 mg via INTRAVENOUS

## 2015-12-04 MED ORDER — ONDANSETRON HCL 4 MG/2ML IJ SOLN
INTRAMUSCULAR | Status: AC
  Filled 2015-12-04: qty 2

## 2015-12-04 MED ORDER — FOLIC ACID 1 MG PO TABS
1.0000 mg | ORAL_TABLET | Freq: Every day | ORAL | Status: DC
  Administered 2015-12-04 – 2015-12-08 (×5): 1 mg via ORAL
  Filled 2015-12-04 (×5): qty 1

## 2015-12-04 MED ORDER — METOCLOPRAMIDE HCL 5 MG PO TABS: 5.0000 mg | ORAL_TABLET | Freq: Three times a day (TID) | ORAL | Status: DC | PRN

## 2015-12-04 MED ORDER — MEPERIDINE HCL 50 MG/ML IJ SOLN: 6.2500 mg | INTRAMUSCULAR | Status: DC | PRN

## 2015-12-04 MED ORDER — HYDROMORPHONE HCL 1 MG/ML IJ SOLN
INTRAMUSCULAR | Status: AC
  Filled 2015-12-04: qty 1

## 2015-12-04 MED ORDER — PANTOPRAZOLE SODIUM 40 MG PO TBEC
40.0000 mg | DELAYED_RELEASE_TABLET | Freq: Every day | ORAL | Status: DC
  Administered 2015-12-04 – 2015-12-07 (×4): 40 mg via ORAL
  Filled 2015-12-04 (×5): qty 1

## 2015-12-04 MED ORDER — CHLORHEXIDINE GLUCONATE 4 % EX LIQD: 60.0000 mL | Freq: Once | CUTANEOUS | Status: DC

## 2015-12-04 MED ORDER — FENTANYL CITRATE (PF) 100 MCG/2ML IJ SOLN
INTRAMUSCULAR | Status: DC | PRN
  Administered 2015-12-04 (×9): 50 ug via INTRAVENOUS

## 2015-12-04 MED ORDER — TRANEXAMIC ACID 1000 MG/10ML IV SOLN
1000.0000 mg | INTRAVENOUS | Status: AC
  Administered 2015-12-04: 1000 mg via INTRAVENOUS
  Filled 2015-12-04: qty 10

## 2015-12-04 MED ORDER — OXYCODONE HCL 5 MG/5ML PO SOLN: 5.0000 mg | Freq: Once | ORAL | Status: DC | PRN

## 2015-12-04 MED ORDER — ASPIRIN EC 325 MG PO TBEC
325.0000 mg | DELAYED_RELEASE_TABLET | Freq: Two times a day (BID) | ORAL | Status: DC
  Administered 2015-12-05 – 2015-12-08 (×7): 325 mg via ORAL
  Filled 2015-12-04 (×7): qty 1

## 2015-12-04 MED ORDER — MENTHOL 3 MG MT LOZG: 1.0000 | LOZENGE | OROMUCOSAL | Status: DC | PRN

## 2015-12-04 MED ORDER — POLYETHYLENE GLYCOL 3350 17 G PO PACK: 17.0000 g | PACK | Freq: Every day | ORAL | Status: DC | PRN

## 2015-12-04 MED ORDER — ACETAMINOPHEN 650 MG RE SUPP
650.0000 mg | Freq: Four times a day (QID) | RECTAL | Status: DC | PRN
Start: 2015-12-04 — End: 2015-12-08

## 2015-12-04 MED ORDER — HYDROCHLOROTHIAZIDE 12.5 MG PO CAPS
12.5000 mg | ORAL_CAPSULE | Freq: Every day | ORAL | Status: DC
  Administered 2015-12-04 – 2015-12-05 (×2): 12.5 mg via ORAL
  Filled 2015-12-04 (×3): qty 1

## 2015-12-04 MED ORDER — KETOROLAC TROMETHAMINE 15 MG/ML IJ SOLN
7.5000 mg | Freq: Four times a day (QID) | INTRAMUSCULAR | Status: AC
  Administered 2015-12-04 – 2015-12-05 (×4): 7.5 mg via INTRAVENOUS
  Filled 2015-12-04 (×4): qty 1

## 2015-12-04 MED ORDER — ADULT MULTIVITAMIN W/MINERALS CH
1.0000 | ORAL_TABLET | Freq: Every day | ORAL | Status: DC
  Administered 2015-12-05 – 2015-12-08 (×4): 1 via ORAL
  Filled 2015-12-04 (×4): qty 1

## 2015-12-04 MED ORDER — HYDROMORPHONE HCL 1 MG/ML IJ SOLN
INTRAMUSCULAR | Status: DC | PRN
  Administered 2015-12-04 (×2): 1 mg via INTRAVENOUS

## 2015-12-04 MED ORDER — ROCURONIUM BROMIDE 100 MG/10ML IV SOLN
INTRAVENOUS | Status: AC
  Filled 2015-12-04: qty 1

## 2015-12-04 MED ORDER — METHOCARBAMOL 500 MG PO TABS
500.0000 mg | ORAL_TABLET | Freq: Four times a day (QID) | ORAL | Status: DC | PRN
  Administered 2015-12-05 – 2015-12-08 (×8): 500 mg via ORAL
  Filled 2015-12-04 (×8): qty 1

## 2015-12-04 MED ORDER — MIDAZOLAM HCL 5 MG/5ML IJ SOLN
INTRAMUSCULAR | Status: DC | PRN
  Administered 2015-12-04: 2 mg via INTRAVENOUS

## 2015-12-04 MED ORDER — HYDROMORPHONE HCL 1 MG/ML IJ SOLN
0.2500 mg | INTRAMUSCULAR | Status: DC | PRN
  Administered 2015-12-04: 0.5 mg via INTRAVENOUS

## 2015-12-04 MED ORDER — FENTANYL CITRATE (PF) 100 MCG/2ML IJ SOLN
INTRAMUSCULAR | Status: AC
  Filled 2015-12-04: qty 2

## 2015-12-04 MED ORDER — ROCURONIUM BROMIDE 100 MG/10ML IV SOLN
INTRAVENOUS | Status: DC | PRN
  Administered 2015-12-04: 30 mg via INTRAVENOUS
  Administered 2015-12-04: 70 mg via INTRAVENOUS

## 2015-12-04 MED ORDER — ALFUZOSIN HCL ER 10 MG PO TB24
10.0000 mg | ORAL_TABLET | Freq: Every day | ORAL | Status: DC
  Administered 2015-12-05 – 2015-12-08 (×4): 10 mg via ORAL
  Filled 2015-12-04 (×5): qty 1

## 2015-12-04 MED ORDER — METOCLOPRAMIDE HCL 5 MG/ML IJ SOLN: 5.0000 mg | Freq: Three times a day (TID) | INTRAMUSCULAR | Status: DC | PRN

## 2015-12-04 MED ORDER — ONDANSETRON HCL 4 MG PO TABS: 4.0000 mg | ORAL_TABLET | Freq: Four times a day (QID) | ORAL | Status: DC | PRN

## 2015-12-04 MED ORDER — CEFAZOLIN SODIUM 10 G IJ SOLR
3.0000 g | INTRAMUSCULAR | Status: AC
  Administered 2015-12-04: 3 g via INTRAVENOUS
  Filled 2015-12-04: qty 3

## 2015-12-04 MED ORDER — MIDAZOLAM HCL 2 MG/2ML IJ SOLN
INTRAMUSCULAR | Status: AC
  Filled 2015-12-04: qty 2

## 2015-12-04 MED ORDER — PHENOL 1.4 % MT LIQD: 1.0000 | OROMUCOSAL | Status: DC | PRN

## 2015-12-04 MED ORDER — ZOLPIDEM TARTRATE 5 MG PO TABS: 5.0000 mg | ORAL_TABLET | Freq: Every evening | ORAL | Status: DC | PRN

## 2015-12-04 MED ORDER — ROSUVASTATIN CALCIUM 5 MG PO TABS
10.0000 mg | ORAL_TABLET | Freq: Every day | ORAL | Status: DC
  Administered 2015-12-05 – 2015-12-07 (×3): 10 mg via ORAL
  Filled 2015-12-04 (×3): qty 2

## 2015-12-04 MED ORDER — SUGAMMADEX SODIUM 200 MG/2ML IV SOLN
INTRAVENOUS | Status: AC
  Filled 2015-12-04: qty 2

## 2015-12-04 MED ORDER — ONDANSETRON HCL 4 MG/2ML IJ SOLN
INTRAMUSCULAR | Status: DC | PRN
  Administered 2015-12-04: 4 mg via INTRAVENOUS

## 2015-12-04 MED ORDER — DOCUSATE SODIUM 100 MG PO CAPS
100.0000 mg | ORAL_CAPSULE | Freq: Two times a day (BID) | ORAL | Status: DC
  Administered 2015-12-04 – 2015-12-08 (×8): 100 mg via ORAL
  Filled 2015-12-04 (×8): qty 1

## 2015-12-04 MED ORDER — LOSARTAN POTASSIUM-HCTZ 50-12.5 MG PO TABS: 1.0000 | ORAL_TABLET | Freq: Every day | ORAL | Status: DC

## 2015-12-04 MED ORDER — ALUM & MAG HYDROXIDE-SIMETH 200-200-20 MG/5ML PO SUSP: 30.0000 mL | ORAL | Status: DC | PRN

## 2015-12-04 MED ORDER — SODIUM CHLORIDE 0.9 % IV SOLN
INTRAVENOUS | Status: DC
  Administered 2015-12-04: 20:00:00 via INTRAVENOUS
  Administered 2015-12-05: 75 mL/h via INTRAVENOUS
  Administered 2015-12-06: 12:00:00 via INTRAVENOUS

## 2015-12-04 MED ORDER — PROPOFOL 10 MG/ML IV BOLUS
INTRAVENOUS | Status: DC | PRN
  Administered 2015-12-04: 200 mg via INTRAVENOUS

## 2015-12-04 MED ORDER — LIDOCAINE HCL (CARDIAC) 20 MG/ML IV SOLN
INTRAVENOUS | Status: AC
  Filled 2015-12-04: qty 5

## 2015-12-04 MED ORDER — ONDANSETRON HCL 4 MG/2ML IJ SOLN: 4.0000 mg | Freq: Four times a day (QID) | INTRAMUSCULAR | Status: DC | PRN

## 2015-12-04 MED ORDER — FENTANYL CITRATE (PF) 250 MCG/5ML IJ SOLN
INTRAMUSCULAR | Status: AC
  Filled 2015-12-04: qty 5

## 2015-12-04 MED ORDER — DIPHENHYDRAMINE HCL 12.5 MG/5ML PO ELIX: 12.5000 mg | ORAL_SOLUTION | ORAL | Status: DC | PRN

## 2015-12-04 MED ORDER — ACETAMINOPHEN 325 MG PO TABS
650.0000 mg | ORAL_TABLET | Freq: Four times a day (QID) | ORAL | Status: DC | PRN
  Administered 2015-12-05: 325 mg via ORAL
  Administered 2015-12-06: 650 mg via ORAL
  Filled 2015-12-04 (×2): qty 2

## 2015-12-04 MED ORDER — LOSARTAN POTASSIUM 50 MG PO TABS
50.0000 mg | ORAL_TABLET | Freq: Every day | ORAL | Status: DC
  Administered 2015-12-04 – 2015-12-05 (×2): 50 mg via ORAL
  Filled 2015-12-04 (×3): qty 1

## 2015-12-04 SURGICAL SUPPLY — 45 items
BAG SPEC THK2 15X12 ZIP CLS (MISCELLANEOUS)
BAG ZIPLOCK 12X15 (MISCELLANEOUS) IMPLANT
BLADE SAW SGTL 18X1.27X75 (BLADE) ×4 IMPLANT
BLADE SAW SGTL 18X1.27X75MM (BLADE) ×2
BLADE SURG SZ10 CARB STEEL (BLADE) ×6 IMPLANT
CAPT HIP TOTAL 2 ×6 IMPLANT
CELLS DAT CNTRL 66122 CELL SVR (MISCELLANEOUS) ×2 IMPLANT
CLOTH BEACON ORANGE TIMEOUT ST (SAFETY) ×3 IMPLANT
COVER PERINEAL POST (MISCELLANEOUS) ×3 IMPLANT
DRAPE C-ARM 42X120 X-RAY (DRAPES) ×3 IMPLANT
DRAPE STERI IOBAN 125X83 (DRAPES) ×6 IMPLANT
DRAPE U-SHAPE 47X51 STRL (DRAPES) ×9 IMPLANT
DRSG AQUACEL AG ADV 3.5X10 (GAUZE/BANDAGES/DRESSINGS) ×6 IMPLANT
DURAPREP 26ML APPLICATOR (WOUND CARE) ×3 IMPLANT
ELECT BLADE TIP CTD 4 INCH (ELECTRODE) ×6 IMPLANT
ELECT PENCIL ROCKER SW 15FT (MISCELLANEOUS) IMPLANT
ELECT REM PT RETURN 9FT ADLT (ELECTROSURGICAL) ×6
ELECTRODE REM PT RTRN 9FT ADLT (ELECTROSURGICAL) ×2 IMPLANT
EVACUATOR 1/8 PVC DRAIN (DRAIN) IMPLANT
FACESHIELD WRAPAROUND (MASK) ×12 IMPLANT
GAUZE XEROFORM 1X8 LF (GAUZE/BANDAGES/DRESSINGS) ×6 IMPLANT
GLOVE BIO SURGEON STRL SZ7.5 (GLOVE) ×3 IMPLANT
GLOVE BIOGEL PI IND STRL 8 (GLOVE) ×2 IMPLANT
GLOVE BIOGEL PI INDICATOR 8 (GLOVE) ×4
GLOVE ECLIPSE 8.0 STRL XLNG CF (GLOVE) ×3 IMPLANT
GOWN STRL REUS W/TWL XL LVL3 (GOWN DISPOSABLE) ×6 IMPLANT
HANDPIECE INTERPULSE COAX TIP (DISPOSABLE) ×6
LIQUID BAND (GAUZE/BANDAGES/DRESSINGS) IMPLANT
MARKER SKIN DUAL TIP RULER LAB (MISCELLANEOUS) ×3 IMPLANT
PACK ANTERIOR HIP CUSTOM (KITS) ×3 IMPLANT
PACK UNIVERSAL I (CUSTOM PROCEDURE TRAY) IMPLANT
RTRCTR WOUND ALEXIS 18CM MED (MISCELLANEOUS) ×6
SET HNDPC FAN SPRY TIP SCT (DISPOSABLE) ×2 IMPLANT
SPONGE LAP 18X18 X RAY DECT (DISPOSABLE) IMPLANT
STAPLER VISISTAT 35W (STAPLE) ×3 IMPLANT
SUT ETHIBOND NAB CT1 #1 30IN (SUTURE) ×6 IMPLANT
SUT MNCRL AB 4-0 PS2 18 (SUTURE) ×6 IMPLANT
SUT VIC AB 0 CT1 36 (SUTURE) ×6 IMPLANT
SUT VIC AB 1 CT1 36 (SUTURE) ×6 IMPLANT
SUT VIC AB 2-0 CT1 27 (SUTURE) ×6
SUT VIC AB 2-0 CT1 TAPERPNT 27 (SUTURE) ×2 IMPLANT
TOWEL OR 17X26 10 PK STRL BLUE (TOWEL DISPOSABLE) ×3 IMPLANT
TRAY FOLEY W/METER SILVER 14FR (SET/KITS/TRAYS/PACK) IMPLANT
TRAY FOLEY W/METER SILVER 16FR (SET/KITS/TRAYS/PACK) ×3 IMPLANT
YANKAUER SUCT BULB TIP 10FT TU (MISCELLANEOUS) ×6 IMPLANT

## 2015-12-04 NOTE — Anesthesia Preprocedure Evaluation (Signed)
Anesthesia Evaluation  Patient identified by MRN, date of birth, ID band Patient awake    Reviewed: Allergy & Precautions, NPO status , Patient's Chart, lab work & pertinent test results  Airway Mallampati: I  TM Distance: >3 FB Neck ROM: Full    Dental  (+) Teeth Intact, Dental Advisory Given   Pulmonary    breath sounds clear to auscultation       Cardiovascular hypertension, Pt. on medications  Rhythm:Regular Rate:Normal     Neuro/Psych    GI/Hepatic GERD  Medicated and Controlled,  Endo/Other    Renal/GU      Musculoskeletal   Abdominal   Peds  Hematology   Anesthesia Other Findings   Reproductive/Obstetrics                             Anesthesia Physical Anesthesia Plan  ASA: II  Anesthesia Plan: General   Post-op Pain Management:    Induction: Intravenous  Airway Management Planned: Oral ETT  Additional Equipment:   Intra-op Plan:   Post-operative Plan: Extubation in OR  Informed Consent: I have reviewed the patients History and Physical, chart, labs and discussed the procedure including the risks, benefits and alternatives for the proposed anesthesia with the patient or authorized representative who has indicated his/her understanding and acceptance.   Dental advisory given  Plan Discussed with: CRNA, Anesthesiologist and Surgeon  Anesthesia Plan Comments:         Anesthesia Quick Evaluation

## 2015-12-04 NOTE — H&P (Signed)
TOTAL HIP ADMISSION H&P  Patient is admitted for bilaterally total hip arthroplasty.  Subjective:  Chief Complaint: bilaterally hip pain  HPI: Andrew Wilkins, 50 y.o. male, has a history of pain and functional disability in the bilaterally hip(s) due to arthritis and patient has failed non-surgical conservative treatments for greater than 12 weeks to include NSAID's and/or analgesics, flexibility and strengthening excercises, weight reduction as appropriate and activity modification.  Onset of symptoms was gradual starting 5 years ago with gradually worsening course since that time.The patient noted no past surgery on the bilaterally hip(s).  Patient currently rates pain in the bilaterally hip at 10 out of 10 with activity. Patient has night pain, worsening of pain with activity and weight bearing, pain that interfers with activities of daily living, pain with passive range of motion and joint swelling. Patient has evidence of subchondral cysts, subchondral sclerosis, periarticular osteophytes and joint space narrowing by imaging studies. This condition presents safety issues increasing the risk of falls.  There is no current active infection.  Patient Active Problem List   Diagnosis Date Noted  . Osteoarthritis of bilateral hips 12/04/2015  . Hyperglycemia 10/05/2015  . BPH (benign prostatic hyperplasia) 04/02/2014  . Hemangioma of liver 04/02/2014  . Nephrolithiasis 04/02/2014  . Family history of peripheral vascular disease 03/17/2011  . Lipoma 07/08/2009  . GERD 06/18/2007  . Hyperlipidemia 02/26/2007  . INSOMNIA, PERSISTENT 02/26/2007  . Essential hypertension 02/26/2007  . Osteoarthritis 02/26/2007   Past Medical History  Diagnosis Date  . Hyperlipidemia   . GERD (gastroesophageal reflux disease)   . Hypertension   . BPH (benign prostatic hyperplasia)   . Plantar fasciitis     cortisone injection  . Kidney stone   . Arthritis     Past Surgical History  Procedure  Laterality Date  . None    . Tonsillectomy      age 58  . Left eye surgery      left eye surgery-squint surgery-age 57    No prescriptions prior to admission   No Known Allergies  Social History  Substance Use Topics  . Smoking status: Never Smoker   . Smokeless tobacco: Not on file  . Alcohol Use: Yes     Comment: 3 per month    Family History  Problem Relation Age of Onset  . Lung cancer Father     former smoker  . Heart disease Father 9    PAD (carotid endarterectomy at 54) and CAD with MI in late 53s early 104s.   . Heart disease Sister 74    mi, smoker  . Crohn's disease Daughter   . Stroke Paternal Grandfather 47  . Stroke Brother 54    physician who stopped practicing as result     Review of Systems  Musculoskeletal: Positive for joint pain.  All other systems reviewed and are negative.   Objective:  Physical Exam  Constitutional: He is oriented to person, place, and time. He appears well-developed and well-nourished.  HENT:  Head: Normocephalic and atraumatic.  Eyes: EOM are normal. Pupils are equal, round, and reactive to light.  Neck: Neck supple.  Cardiovascular: Normal rate and regular rhythm.   Respiratory: Effort normal and breath sounds normal.  GI: Soft. Bowel sounds are normal.  Musculoskeletal:       Right hip: He exhibits decreased range of motion, decreased strength, tenderness and bony tenderness.       Left hip: He exhibits decreased range of motion, decreased strength, tenderness and bony  tenderness.  Neurological: He is alert and oriented to person, place, and time.  Skin: Skin is warm and dry.  Psychiatric: He has a normal mood and affect.    Vital signs in last 24 hours:    Labs:   Estimated body mass index is 34.01 kg/(m^2) as calculated from the following:   Height as of 10/05/15: 6\' 2"  (1.88 m).   Weight as of 10/05/15: 120.203 kg (265 lb).   Imaging Review Plain radiographs demonstrate severe degenerative joint disease of the  bilateral hip(s). The bone quality appears to be excellent for age and reported activity level.  Assessment/Plan:  End stage arthritis, bilaterally hip(s)  The patient history, physical examination, clinical judgement of the provider and imaging studies are consistent with end stage degenerative joint disease of the bilaterally hip(s) and total hip arthroplasty is deemed medically necessary. The treatment options including medical management, injection therapy, arthroscopy and arthroplasty were discussed at length. The risks and benefits of total hip arthroplasty were presented and reviewed. The risks due to aseptic loosening, infection, stiffness, dislocation/subluxation,  thromboembolic complications and other imponderables were discussed.  The patient acknowledged the explanation, agreed to proceed with the plan and consent was signed. Patient is being admitted for inpatient treatment for surgery, pain control, PT, OT, prophylactic antibiotics, VTE prophylaxis, progressive ambulation and ADL's and discharge planning.The patient is planning to be discharged home with home health services

## 2015-12-04 NOTE — Brief Op Note (Signed)
12/04/2015  5:45 PM  PATIENT:  Andrew Wilkins  50 y.o. male  PRE-OPERATIVE DIAGNOSIS:  Bilateral hip osteoarthritis  POST-OPERATIVE DIAGNOSIS:  Bilateral hip osteoarthritis  PROCEDURE:  Procedure(s): BILATERAL ANTERIOR APPROACH TOTAL HIP ARTHROPLASTY (Bilateral)  SURGEON:  Surgeon(s) and Role:    * Mcarthur Rossetti, MD - Primary  ANESTHESIA:   general  EBL:  Total I/O In: 3000 [I.V.:3000] Out: 950 [Urine:250; Blood:700]  COUNTS:  YES  DICTATION: .Other Dictation: Dictation Number [id number]  PLAN OF CARE: Admit to inpatient   PATIENT DISPOSITION:  PACU - hemodynamically stable.   Delay start of Pharmacological VTE agent (>24hrs) due to surgical blood loss or risk of bleeding: no

## 2015-12-04 NOTE — Transfer of Care (Signed)
Immediate Anesthesia Transfer of Care Note  Patient: Andrew Wilkins  Procedure(s) Performed: Procedure(s): BILATERAL ANTERIOR APPROACH TOTAL HIP ARTHROPLASTY (Bilateral)  Patient Location: PACU  Anesthesia Type:General  Level of Consciousness:  sedated, patient cooperative and responds to stimulation  Airway & Oxygen Therapy:Patient Spontanous Breathing and Patient connected to face mask oxgen  Post-op Assessment:  Report given to PACU RN and Post -op Vital signs reviewed and stable  Post vital signs:  Reviewed and stable  Last Vitals:  Filed Vitals:   12/04/15 1301  BP: 124/71  Pulse: 62  Temp: 37.1 C  Resp: 18    Complications: No apparent anesthesia complications

## 2015-12-04 NOTE — Anesthesia Procedure Notes (Signed)
Procedure Name: Intubation Date/Time: 12/04/2015 2:48 PM Performed by: Montel Clock Pre-anesthesia Checklist: Patient identified, Emergency Drugs available, Suction available, Patient being monitored and Timeout performed Patient Re-evaluated:Patient Re-evaluated prior to inductionOxygen Delivery Method: Circle system utilized Preoxygenation: Pre-oxygenation with 100% oxygen Intubation Type: IV induction Ventilation: Mask ventilation without difficulty Laryngoscope Size: Mac and 3 Grade View: Grade II Tube type: Oral Tube size: 7.5 mm Number of attempts: 1 Airway Equipment and Method: Stylet Placement Confirmation: ETT inserted through vocal cords under direct vision,  positive ETCO2 and breath sounds checked- equal and bilateral Secured at: 23 cm Tube secured with: Tape Dental Injury: Teeth and Oropharynx as per pre-operative assessment

## 2015-12-04 NOTE — Anesthesia Postprocedure Evaluation (Signed)
Anesthesia Post Note  Patient: Andrew Wilkins  Procedure(s) Performed: Procedure(s) (LRB): BILATERAL ANTERIOR APPROACH TOTAL HIP ARTHROPLASTY (Bilateral)  Patient location during evaluation: PACU Anesthesia Type: General Level of consciousness: awake and alert Pain management: pain level controlled Vital Signs Assessment: post-procedure vital signs reviewed and stable Respiratory status: spontaneous breathing, nonlabored ventilation, respiratory function stable and patient connected to nasal cannula oxygen Cardiovascular status: blood pressure returned to baseline and stable Postop Assessment: no signs of nausea or vomiting Anesthetic complications: no    Last Vitals:  Filed Vitals:   12/04/15 1301 12/04/15 1745  BP: 124/71 118/80  Pulse: 62 93  Temp: 37.1 C 36.9 C  Resp: 18 10    Last Pain:  Filed Vitals:   12/04/15 1751  PainSc: 1                  [name] A

## 2015-12-05 LAB — BASIC METABOLIC PANEL
Anion gap: 4 — ABNORMAL LOW (ref 5–15)
BUN: 17 mg/dL (ref 6–20)
CALCIUM: 8 mg/dL — AB (ref 8.9–10.3)
CO2: 27 mmol/L (ref 22–32)
CREATININE: 0.9 mg/dL (ref 0.61–1.24)
Chloride: 104 mmol/L (ref 101–111)
GFR calc non Af Amer: >60 mL/min (ref >60)
Glucose, Bld: 164 mg/dL — ABNORMAL HIGH (ref 65–99)
Potassium: 3.9 mmol/L (ref 3.5–5.1)
SODIUM: 135 mmol/L (ref 135–145)

## 2015-12-05 LAB — CBC
HCT: 29.3 % — ABNORMAL LOW (ref 39.0–52.0)
Hemoglobin: 10.3 g/dL — ABNORMAL LOW (ref 13.0–17.0)
MCH: 29.9 pg (ref 26.0–34.0)
MCHC: 35.2 g/dL (ref 30.0–36.0)
MCV: 84.9 fL (ref 78.0–100.0)
PLATELETS: 182 10*3/uL (ref 150–400)
RBC: 3.45 MIL/uL — ABNORMAL LOW (ref 4.22–5.81)
RDW: 12.9 % (ref 11.5–15.5)
WBC: 8.4 10*3/uL (ref 4.0–10.5)

## 2015-12-05 MED ORDER — SODIUM CHLORIDE 0.9 % IV BOLUS (SEPSIS)
1000.0000 mL | Freq: Once | INTRAVENOUS | Status: AC
  Administered 2015-12-05: 1000 mL via INTRAVENOUS

## 2015-12-05 NOTE — Op Note (Signed)
NAMEARISTEDE, BUCHMANN NO.:  [id number]  MEDICAL RECORD NO.:  [id number]  LOCATION:  84                         FACILITY:  [facility]  PHYSICIAN:  Lind Guest. Ninfa Linden, M.D.DATE OF BIRTH:  1965-11-12  DATE OF PROCEDURE:  12/04/2015 DATE OF DISCHARGE:                              OPERATIVE REPORT   PREOPERATIVE DIAGNOSIS:  Severe osteoarthritis and degenerative joint disease with femoroacetabular impingement, bilateral hips.  POSTOPERATIVE DIAGNOSIS:  Severe osteoarthritis and degenerative joint disease with femoroacetabular impingement, bilateral hips.  PROCEDURE:  Bilateral direct anterior total hip arthroplasties.  IMPLANTS: 1. Right hip size 54 acetabular component with a single screw, size     36+ 4 polyethylene liner, size 12 Corail femoral component with     varus offset, size 36+ 1.5 ceramic hip ball, left hip. 2. Left hip, DePuy Gription acetabular component size 54 with a single     screw, size 36+ 4 polyethylene liner, size 11 Corail femoral     component with varus offset, size 36+ 5 ceramic hip ball.  SURGEON:  Lind Guest. Ninfa Linden, M.D.  ANESTHESIA:  General.  ANTIBIOTICS:  2 g IV Ancef.  BLOOD LOSS:  [id number] mL total.  COMPLICATIONS:  None.  INDICATIONS:  Mr. Neptune is a 50 year old gentleman with several year history of severe femoral acetabular impingement and pain that has led to primary osteoarthritis and degenerative joint disease of both his hips.  He has tried and failed all forms of conservative treatment.  He is the [employer] of [employer] for [employer].  His pain is daily, it detrimentally affects his activities of daily living, his quality of life, and his mobility.  At this point, he does wish to proceed with bilateral hip replacements, and I have examined him thoroughly.  He is very healthy individual.  We talked about the risks and benefits of bilateral joint surgery and he does wish to proceed.  I agree with this given the  fact that if we start with his right hip and Anesthesia wants [place] not proceed, we can hold off and go with the left hip at a later date versus both hips at once pending the course the case is taking.  Our goals of surgery are decreased pain, improved mobility, and overall improved quality of life.  PROCEDURE DESCRIPTION:  After informed consent and both hips, right and left, were marked, he was brought to the operating room and general anesthesia was obtained while he was on the stretcher.  A Foley catheter was placed and then both feet had traction boots applied to them.  Next, he was placed supine on the Hana fracture table with the perineal post in place and both legs in inline skeletal traction devices, but no traction applied.  His right operative hip was first prepped and draped with DuraPrep and sterile drapes.  Time-out was called.  He was identified as the correct patient and correct right hip.  We then took our standard anterior approach to the hip, just made our incision just inferior and posterior to the anterior superior iliac spine and carried this obliquely down the leg.  We dissected down the tensor fascia lata muscle and tensor fascia was then divided longitudinally,  so we could proceed with a direct anterior approach to the hip.  We identified and cauterized the circumflex vessels.  Then, identified the hip capsule. We opened up the hip capsule in an L-type format, finding showed severe disease of his right hip.  We placed Cobra retractors within the hip capsule and then made our femoral neck cut with an oscillating saw proximal to the lesser trochanter and completed this with an osteotome. We placed a corkscrew guide in the femoral head and removed the femoral head in its entirety and found to be devoid of cartilage.  We then placed a bent Hohmann over the medial acetabular rim and released the soft tissue including removing the acetabular labrum.  We then  began reaming under direct visualization from a size 42 reamer up to a size 54 with all reamers under direct visualization, the last reamer under direct fluoroscopy, so we could obtain our depth of reaming, our inclination, and anteversion.  We then placed the real DePuy Sector Gription acetabular component size 54, and a single screw, and a 36+ 4 polyethylene liner for that size acetabular component.  Attention was then turned to the femur.  With the leg externally rotated to 120 degrees, extended and adducted, we were able to place a Mueller retractor medially and a Hohmann retractor behind the greater trochanter.  We released the lateral joint capsule and used a box cutting osteotome to enter femoral canal and a rongeur to lateralize. We then began broaching from a size 8 broach up to a size 12.  With a size 12 in place, we trialed a varus offset femoral neck.  Given his anatomy and a 36+ 1.5 trial hip ball, we brought the leg back over and up with traction and internal rotation.  We were pleased with leg length, offset, and stability.  We then dislocated the hip and removed the trial components and then placed the real Corail femoral component size 12 with varus offset and the real 36+ 1.5 hip ball that was ceramic.  We reduced this in the acetabulum and we were pleased with stability.  We then irrigated the soft tissue with normal saline solution using pulsatile lavage.  We closed the joint capsule with interrupted #1 Ethibond suture followed by running #1 Vicryl in the tensor fascia, 0 Vicryl in the deep tissue, 2-0 Vicryl in the subcutaneous tissue, and interrupted staples on the skin.  Xeroform and well-padded sterile dressing were applied.  We talked to Anesthesia and they said let us go ahead and proceed with the left side.  We moved everything around sterilely to the other side of the room.  We changed our gown and gloves and then prepped the left hip with DuraPrep and sterile  drapes.  A time-out was called and he was identified as correct patient and correct left hip now.  We then proceed with the same surgery to the left hip making our incision, taking it down to the tensor fascia, dissected down to the hip joint cauterizing the circumflex vessels.  I opened up the joint capsule identifying the femoral neck, cutting our femoral neck, removing the head, and then reaming the acetabulum.  After placing appropriate retractors and removing labrum, we were able to place a size 54 acetabular component.  Then, on the left side with a single screw, and a 36+ 4 polyethylene liner.  With the femur, we went through the same process as the right leg and were able to place an 11 stem on this  side.  We got the 11 stem trialed down without complications and we had lower neck cut, so we trialed a 36+ 5 hip ball.  We brought the leg back over and up.  We were pleased with leg length, offset, and stability.  We then dislocated the hip and removed the trial components.  We then placed the real Corail femoral component size 11 with varus offset on the left side and a 36+ 5 ceramic hip ball and reduced this in the acetabulum.  We were pleased with the stability, again.  We then irrigated the soft tissue on this side and closed in the same fashion with interrupted staples on the skin.  Well-padded sterile dressings were applied on this side and then he was taken off the Hana table, awakened, extubated, and taken to the recovery room in stable condition.  All final counts were correct. There were no complications noted.     Lind Guest. Ninfa Linden, M.D.     CYB/MEDQ  D:  12/04/2015  T:  12/05/2015  Job:  [id number]

## 2015-12-05 NOTE — Evaluation (Signed)
Physical Therapy Evaluation Patient Details Name: Andrew Wilkins MRN: [id number] DOB: [birth year] Today's Date: 12/05/2015   History of Present Illness  Bil THR  Clinical Impression  Pt s/p Bil THR presents with decreased Bil LE strength/ROM and post op pain limiting functional mobility.  Pt should progress to dc home with family assist and HHPT follow up.    Follow Up Recommendations Home health PT    Equipment Recommendations  None recommended by PT    Recommendations for Other Services OT consult     Precautions / Restrictions Precautions Precautions: Fall Restrictions Weight Bearing Restrictions: No Other Position/Activity Restrictions: WBAT      Mobility  Bed Mobility Overal bed mobility: Needs Assistance;+ 2 for safety/equipment Bed Mobility: Supine to Sit     Supine to sit: Mod assist;+2 for safety/equipment     General bed mobility comments: cues for sequence with physical assist to manage LEs  Transfers Overall transfer level: Needs assistance Equipment used: Rolling walker (2 wheeled) Transfers: Sit to/from Stand Sit to Stand: Min assist;+2 physical assistance;+2 safety/equipment;From elevated surface         General transfer comment: cues for LE management and use of UEs to self assist  Ambulation/Gait Ambulation/Gait assistance: Min assist;+2 physical assistance;+2 safety/equipment Ambulation Distance (Feet): 55 Feet Assistive device: Rolling walker (2 wheeled) Gait Pattern/deviations: Step-to pattern;Step-through pattern;Decreased step length - right;Decreased step length - left;Shuffle;Trunk flexed Gait velocity: decr Gait velocity interpretation: Below normal speed for age/gender General Gait Details: cues for posture and position from RW  Stairs            Wheelchair Mobility    Modified Rankin (Stroke Patients Only)       Balance                                             Pertinent Vitals/Pain Pain  Assessment: 0-10 Pain Score: 5  Pain Location: Bil hips/thighs Pain Descriptors / Indicators: Aching;Burning;Sore Pain Intervention(s): Limited activity within patient's tolerance;Monitored during session;Premedicated before session;Ice applied    Home Living Family/patient expects to be discharged to:: Private residence Living Arrangements: Spouse/significant other Available Help at Discharge: Family Type of Home: House Home Access: Stairs to enter   Technical brewer of Steps: 1 Home Layout: One level Home Equipment: Environmental consultant - 2 wheels      Prior Function Level of Independence: Independent               Hand Dominance        Extremity/Trunk Assessment   Upper Extremity Assessment: Overall WFL for tasks assessed           Lower Extremity Assessment: RLE deficits/detail;LLE deficits/detail RLE Deficits / Details: Strength at hip 3-/5 with AAROM at hip to 80 flex and 20 abd LLE Deficits / Details: Strength at hip 2+/5 with AAROM at hip to 80 flex and 20 abd  Cervical / Trunk Assessment: Normal  Communication   Communication: No difficulties  Cognition Arousal/Alertness: Awake/alert Behavior During Therapy: WFL for tasks assessed/performed Overall Cognitive Status: Within Functional Limits for tasks assessed                      General Comments      Exercises Total Joint Exercises Ankle Circles/Pumps: AROM;Both;15 reps;Supine Quad Sets: AROM;Both;10 reps;Supine Heel Slides: AAROM;Both;20 reps;Supine Hip ABduction/ADduction: AAROM;Both;15 reps;Supine      Assessment/Plan  PT Assessment Patient needs continued PT services  PT Diagnosis Difficulty walking   PT Problem List Decreased strength;Decreased range of motion;Decreased activity tolerance;Decreased balance;Decreased mobility;Decreased knowledge of use of DME;Pain  PT Treatment Interventions DME instruction;Gait training;Stair training;Functional mobility training;Therapeutic  activities;Therapeutic exercise;Patient/family education   PT Goals (Current goals can be found in the Care Plan section) Acute Rehab PT Goals Patient Stated Goal: Regain IND and walk without pain PT Goal Formulation: With patient Time For Goal Achievement: 12/12/15 Potential to Achieve Goals: Good    Frequency 7X/week   Barriers to discharge        Co-evaluation               End of Session Equipment Utilized During Treatment: Gait belt Activity Tolerance: Patient tolerated treatment well Patient left: in chair;with call bell/phone within reach;with family/visitor present Nurse Communication: Mobility status         Time: [id number] PT Time Calculation (min) (ACUTE ONLY): 34 min   Charges:   PT Evaluation $PT Eval Low Complexity: 1 Procedure PT Treatments $Therapeutic Exercise: 8-22 mins   PT G Codes:        Andrew Wilkins December 23, 2015, 1:59 PM

## 2015-12-05 NOTE — Discharge Instructions (Signed)

## 2015-12-05 NOTE — Progress Notes (Signed)
Physical Therapy Treatment Patient Details Name: Andrew Wilkins MRN: [id number] DOB: 1966/04/09 Today's Date: December 09, 2015    History of Present Illness Bil THR    PT Comments    Pt very motivated and progressing well with mobility  Follow Up Recommendations  Home health PT     Equipment Recommendations  None recommended by PT    Recommendations for Other Services OT consult     Precautions / Restrictions Precautions Precautions: Fall Restrictions Weight Bearing Restrictions: No Other Position/Activity Restrictions: WBAT    Mobility  Bed Mobility Overal bed mobility: Needs Assistance;+ 2 for safety/equipment Bed Mobility: Sit to Supine       Sit to supine: Mod assist;+2 for safety/equipment;+2 for physical assistance   General bed mobility comments: cues for sequence with physical assist to manage LEs and control trunk  Transfers Overall transfer level: Needs assistance Equipment used: Rolling walker (2 wheeled) Transfers: Sit to/from Stand Sit to Stand: Min assist;+2 physical assistance;+2 safety/equipment         General transfer comment: cues for LE management and use of UEs to self assist  Ambulation/Gait Ambulation/Gait assistance: Min assist Ambulation Distance (Feet): 130 Feet Assistive device: Rolling walker (2 wheeled) Gait Pattern/deviations: Step-to pattern;Step-through pattern;Decreased step length - right;Decreased step length - left;Shuffle;Trunk flexed Gait velocity: decr Gait velocity interpretation: Below normal speed for age/gender General Gait Details: cues for posture, ER on R and position from [employer]            Wheelchair Mobility    Modified Rankin (Stroke Patients Only)       Balance                                    Cognition Arousal/Alertness: Awake/alert Behavior During Therapy: WFL for tasks assessed/performed Overall Cognitive Status: Within Functional Limits for tasks assessed                       Exercises      General Comments        Pertinent Vitals/Pain Pain Assessment: 0-10 Pain Score: 4  Pain Location: Bil hips/thighs Pain Descriptors / Indicators: Aching;Sore;Tightness Pain Intervention(s): Limited activity within patient's tolerance;Monitored during session;Ice applied;RN gave pain meds during session    Home Living                      Prior Function            PT Goals (current goals can now be found in the care plan section) Acute Rehab PT Goals Patient Stated Goal: Regain IND and walk without pain PT Goal Formulation: With patient Time For Goal Achievement: 12/12/15 Potential to Achieve Goals: Good Progress towards PT goals: Progressing toward goals    Frequency  7X/week    PT Plan Current plan remains appropriate    Co-evaluation             End of Session Equipment Utilized During Treatment: Gait belt Activity Tolerance: Patient tolerated treatment well Patient left: with call bell/phone within reach;with family/visitor present;in bed     Time: [id number] PT Time Calculation (min) (ACUTE ONLY): 25 min  Charges:  $Gait Training: 23-37 mins                    G Codes:      Cheryal Salas 2015-12-09, 4:46 PM

## 2015-12-05 NOTE — Progress Notes (Signed)
Subjective: 1 Day Post-Op Procedure(s) (LRB): BILATERAL ANTERIOR APPROACH TOTAL HIP ARTHROPLASTY (Bilateral) Patient reports pain as mild.  Has already been up and feels better than pre-op except for spasms.  Acute blood loss anemia from surgery but tolerating well.  Objective: Vital signs in last 24 hours: Temp:  [97.8 F (36.6 C)-98.7 F (37.1 C)] 97.8 F (36.6 C) (07/01 0444) Pulse Rate:  [62-94] 72 (07/01 0444) Resp:  [9-18] 16 (07/01 0444) BP: (110-124)/(56-80) 114/56 mmHg (07/01 0444) SpO2:  [95 %-100 %] 98 % (07/01 0444) Weight:  [117.482 kg (259 lb)] 117.482 kg (259 lb) (06/30 1309)  Intake/Output from previous day: 06/30 0701 - 07/01 0700 In: 4407.5 [P.O.:960; I.V.:3447.5] Out: 2300 [Urine:1600; Blood:700] Intake/Output this shift:     Recent Labs  12/05/15 0431  HGB 10.3*    Recent Labs  12/05/15 0431  WBC 8.4  RBC 3.45*  HCT 29.3*  PLT 182    Recent Labs  12/05/15 0431  NA 135  K 3.9  CL 104  CO2 27  BUN 17  CREATININE 0.90  GLUCOSE 164*  CALCIUM 8.0*   No results for input(s): LABPT, INR in the last 72 hours.  Sensation intact distally Intact pulses distally Dorsiflexion/Plantar flexion intact Incision: scant drainage  Assessment/Plan: 1 Day Post-Op Procedure(s) (LRB): BILATERAL ANTERIOR APPROACH TOTAL HIP ARTHROPLASTY (Bilateral) Up with therapy  Marykathleen Russi Y 12/05/2015, 11:06 AM

## 2015-12-06 ENCOUNTER — Encounter (HOSPITAL_COMMUNITY): Payer: Self-pay | Admitting: Orthopaedic Surgery

## 2015-12-06 LAB — CBC
HCT: 24.9 % — ABNORMAL LOW (ref 39.0–52.0)
Hemoglobin: 8.7 g/dL — ABNORMAL LOW (ref 13.0–17.0)
MCH: 29.8 pg (ref 26.0–34.0)
MCHC: 34.9 g/dL (ref 30.0–36.0)
MCV: 85.3 fL (ref 78.0–100.0)
Platelets: 147 10*3/uL — ABNORMAL LOW (ref 150–400)
RBC: 2.92 MIL/uL — ABNORMAL LOW (ref 4.22–5.81)
RDW: 13 % (ref 11.5–15.5)
WBC: 7.3 10*3/uL (ref 4.0–10.5)

## 2015-12-06 MED ORDER — HYDROCODONE-ACETAMINOPHEN 5-325 MG PO TABS
1.0000 | ORAL_TABLET | ORAL | Status: DC | PRN
  Administered 2015-12-06 (×2): 1 via ORAL
  Administered 2015-12-06: 2 via ORAL
  Administered 2015-12-07 – 2015-12-08 (×5): 1 via ORAL
  Administered 2015-12-08: 0.5 via ORAL
  Filled 2015-12-06 (×3): qty 1
  Filled 2015-12-06: qty 2
  Filled 2015-12-06 (×5): qty 1

## 2015-12-06 NOTE — Progress Notes (Signed)
Physical Therapy Treatment Patient Details Name: JAKYLE WOLIVER MRN: [id number] DOB: 05/09/1966 Today's Date: 12/06/2015    History of Present Illness Bil THR    PT Comments    POD # 2 Pt reports poor sleep last night.  Max c/o "tight quads".  LOW BP's.  HgB dropped from 10.3 to 8.7 Supine BP 111/58 EOB BP 114/61 Standing BP 93/52 amb 11 feet BP 77/52 Mild c/o feeling "goofy" Positioned  In recliner and reported to RN.   Follow Up Recommendations  Home health PT     Equipment Recommendations  None recommended by PT    Recommendations for Other Services       Precautions / Restrictions Precautions Precautions: Fall Restrictions Weight Bearing Restrictions: No Other Position/Activity Restrictions: WBAT    Mobility  Bed Mobility Overal bed mobility: Needs Assistance Bed Mobility: Sit to Supine     Supine to sit: Mod assist     General bed mobility comments: assist B LE with increased time and bed pad to complete scooting to EOB  Transfers Overall transfer level: Needs assistance Equipment used: Rolling walker (2 wheeled) Transfers: Sit to/from Stand Sit to Stand: Min assist;+2 physical assistance;+2 safety/equipment         General transfer comment: elevated bed and increased time  Ambulation/Gait Ambulation/Gait assistance: Min guard Ambulation Distance (Feet): 11 Feet Assistive device: Rolling walker (2 wheeled) Gait Pattern/deviations: Step-to pattern;Step-through pattern;Decreased step length - right;Decreased step length - left Gait velocity: decr   General Gait Details: limited amb distance due to hypotension   Stairs            Wheelchair Mobility    Modified Rankin (Stroke Patients Only)       Balance                                    Cognition Arousal/Alertness: Awake/alert Behavior During Therapy: WFL for tasks assessed/performed Overall Cognitive Status: Within Functional Limits for tasks assessed                      Exercises      General Comments        Pertinent Vitals/Pain Pain Assessment: 0-10 Pain Score: 7  Faces Pain Scale: Hurts little more Pain Location: B LE quads "tight" Pain Descriptors / Indicators: Tightness Pain Intervention(s): Monitored during session;Repositioned    Home Living Family/patient expects to be discharged to:: Private residence Living Arrangements: Spouse/significant other Available Help at Discharge: Family Type of Home: House Home Access: Stairs to enter   Home Layout: One level Home Equipment: Environmental consultant - 2 wheels      Prior Function Level of Independence: Independent          PT Goals (current goals can now be found in the care plan section) Acute Rehab PT Goals Patient Stated Goal: to be able to get oob Progress towards PT goals: Progressing toward goals    Frequency  7X/week    PT Plan Current plan remains appropriate    Co-evaluation             End of Session Equipment Utilized During Treatment: Gait belt Activity Tolerance: Other (comment) (hypotension) Patient left: in chair;with call bell/phone within reach;with chair alarm set;with nursing/sitter in room     Time: 0855-0920 PT Time Calculation (min) (ACUTE ONLY): 25 min  Charges:  $Gait Training: 8-22 mins $Therapeutic Activity: 8-22 mins  G Codes:      Rica Koyanagi  PTA [facility]  Acute  Rehab Pager      [phone]

## 2015-12-06 NOTE — Progress Notes (Signed)
Subjective: 2 Days Post-Op Procedure(s) (LRB): BILATERAL ANTERIOR APPROACH TOTAL HIP ARTHROPLASTY (Bilateral) Patient reports pain as moderate.  Hypotensive and some light headedness, but hgb 8.7.  May also be from pain meds.  Objective: Vital signs in last 24 hours: Temp:  [97.5 F (36.4 C)-100 F (37.8 C)] 97.5 F (36.4 C) (07/02 0530) Pulse Rate:  [79-92] 83 (07/02 0544) Resp:  [16-18] 16 (07/02 0530) BP: (82-108)/(42-55) 88/50 mmHg (07/02 0544) SpO2:  [94 %-98 %] 96 % (07/02 0530)  Intake/Output from previous day: 07/01 0701 - 07/02 0700 In: 2227.5 [P.O.:1440; I.V.:787.5] Out: 3700 [Urine:3700] Intake/Output this shift: Total I/O In: 240 [P.O.:240] Out: 375 [Urine:375]   Recent Labs  12/05/15 0431 12/06/15 0406  HGB 10.3* 8.7*    Recent Labs  12/05/15 0431 12/06/15 0406  WBC 8.4 7.3  RBC 3.45* 2.92*  HCT 29.3* 24.9*  PLT 182 147*    Recent Labs  12/05/15 0431  NA 135  K 3.9  CL 104  CO2 27  BUN 17  CREATININE 0.90  GLUCOSE 164*  CALCIUM 8.0*   No results for input(s): LABPT, INR in the last 72 hours.  Sensation intact distally Intact pulses distally Dorsiflexion/Plantar flexion intact Incision: dressing C/D/I  Assessment/Plan: 2 Days Post-Op Procedure(s) (LRB): BILATERAL ANTERIOR APPROACH TOTAL HIP ARTHROPLASTY (Bilateral) Up with therapy Discharge home with home health next 1-2 days. Re-check CBC in am.  Mcarthur Rossetti 12/06/2015, 12:07 PM

## 2015-12-06 NOTE — Progress Notes (Signed)
Physical Therapy Treatment Patient Details Name: Andrew Wilkins MRN: [id number] DOB: 06/28/1965 Today's Date: 12/06/2015    History of Present Illness Bil THR    PT Comments    POD # 2 pm session Feeling a little better.   BP supine in recliner 112/58 BP standing 96/55 BP after amb 10 feet 102/57 Improved from this morning but still soft. Tolerated amb a greater distance then assisted back to bed to perform B LE THR TE's.  Follow Up Recommendations  Home health PT     Equipment Recommendations       Recommendations for Other Services       Precautions / Restrictions Precautions Precautions: Fall Restrictions Weight Bearing Restrictions: No Other Position/Activity Restrictions: WBAT    Mobility  Bed Mobility Overal bed mobility: Needs Assistance         Sit to supine: Mod assist   General bed mobility comments: assist back to bed support B LE at the same time  Transfers Overall transfer level: Needs assistance Equipment used: Rolling walker (2 wheeled) Transfers: Sit to/from Stand Sit to Stand: Min assist;Min guard         General transfer comment: increased time  Ambulation/Gait Ambulation/Gait assistance: Min guard Ambulation Distance (Feet): 65 Feet Assistive device: Rolling walker (2 wheeled) Gait Pattern/deviations: Step-to pattern;Step-through pattern;Decreased step length - right;Decreased step length - left Gait velocity: decr   General Gait Details: tolerated increased distance but still limited   Stairs            Wheelchair Mobility    Modified Rankin (Stroke Patients Only)       Balance                                    Cognition Arousal/Alertness: Awake/alert Behavior During Therapy: WFL for tasks assessed/performed Overall Cognitive Status: Within Functional Limits for tasks assessed                      Exercises   Total Hip Replacement TE's 10 reps ankle pumps 10 reps knee presses 10  reps heel slides 10 reps SAQ's 10 reps ABD Using a long bed sheet to assist Followed by ICE    General Comments        Pertinent Vitals/Pain Pain Assessment: 0-10 Pain Score: 6  Pain Location: B hip L>R Pain Descriptors / Indicators: Sore;Tender;Tightness Pain Intervention(s): Monitored during session;Premedicated before session;Repositioned;Ice applied    Home Living                      Prior Function            PT Goals (current goals can now be found in the care plan section) Progress towards PT goals: Progressing toward goals    Frequency  7X/week    PT Plan Current plan remains appropriate    Co-evaluation             End of Session Equipment Utilized During Treatment: Gait belt Activity Tolerance: Patient limited by fatigue;No increased pain Patient left: in bed;with call bell/phone within reach     Time: 1437-1508 PT Time Calculation (min) (ACUTE ONLY): 31 min  Charges:  $Gait Training: 8-22 mins $Therapeutic Exercise: 8-22 mins                    G Codes:      Andrew Wilkins  PTA [employer]  Acute  Rehab Pager      [phone]

## 2015-12-06 NOTE — Evaluation (Signed)
Occupational Therapy Evaluation Patient Details Name: Andrew Wilkins MRN: [id number] DOB: 1965/09/27 Today's Date: 12/06/2015    History of Present Illness Bil THR   Clinical Impression   Patient is s/p BIL THR surgery resulting in functional limitations due to the deficits listed below (see OT problem list). PTA was independent with all adls. Pt currently limited by low BP and bed level only evaluation. Pt will have wife and 51 yo son (A) upon d/c. Pt eager to get OOB .  Patient will benefit from skilled OT acutely to increase independence and safety with ADLS to allow discharge home with DME.     Follow Up Recommendations  No OT follow up    Equipment Recommendations  3 in 1 bedside comode    Recommendations for Other Services       Precautions / Restrictions Precautions Precautions: Fall Restrictions Other Position/Activity Restrictions: WBAT      Mobility Bed Mobility Overal bed mobility: Needs Assistance             General bed mobility comments: bed positioning needing (A) due to inability to bend knees into flexion and pain with direct rotation toward hips. Pt able to use sheet as leg loop to reposition leg with mod (A)  Transfers                 General transfer comment: not assessed due to BP    Balance                                            ADL Overall ADL's : Needs assistance/impaired Eating/Feeding: Independent;Bed level   Grooming: Wash/dry hands;Wash/dry face;Oral care;Set up;Sitting Grooming Details (indicate cue type and reason): limited to bed level due to Bp low and awaiting MD orders to bolus. Rn contacting MD Upper Body Bathing: Set up;Sitting;Bed level   Lower Body Bathing: Moderate assistance;Bed level   Upper Body Dressing : Supervision/safety;Bed level                     General ADL Comments: pt asking to sit EOB but due to BP, RN requesting OT keep patient bed level at this time. Pt  educated on use of sheet as leg lifter and reports relief by being able to self range supine. pt reports feeling quad pain and range helping. pt reports minimal pain at the hip area directly.      Vision     Perception     Praxis      Pertinent Vitals/Pain Pain Assessment: Faces Faces Pain Scale: Hurts little more Pain Location: quad bil LE  Pain Descriptors / Indicators: Sore Pain Intervention(s): Repositioned;Limited activity within patient's tolerance     Hand Dominance Right   Extremity/Trunk Assessment Upper Extremity Assessment Upper Extremity Assessment: Overall WFL for tasks assessed   Lower Extremity Assessment Lower Extremity Assessment: Defer to PT evaluation   Cervical / Trunk Assessment Cervical / Trunk Assessment: Normal   Communication Communication Communication: No difficulties   Cognition Arousal/Alertness: Awake/alert Behavior During Therapy: WFL for tasks assessed/performed Overall Cognitive Status: Within Functional Limits for tasks assessed                     General Comments       Exercises       Shoulder Instructions      Home Living Family/patient expects to  be discharged to:: Private residence Living Arrangements: Spouse/significant other Available Help at Discharge: Family Type of Home: House Home Access: Stairs to enter Technical brewer of Steps: 1   Home Layout: One level     Bathroom Shower/Tub: [place]: Environmental consultant - 2 wheels          Prior Functioning/Environment Level of Independence: Independent             OT Diagnosis: Generalized weakness;Acute pain   OT Problem List: Decreased strength;Decreased activity tolerance;Impaired balance (sitting and/or standing);Decreased safety awareness;Decreased knowledge of use of DME or AE;Decreased knowledge of precautions;Pain   OT Treatment/Interventions: Self-care/ADL training;Therapeutic exercise;DME and/or AE  instruction;Therapeutic activities;Patient/family education;Balance training    OT Goals(Current goals can be found in the care plan section) Acute Rehab OT Goals Patient Stated Goal: to be able to get oob OT Goal Formulation: With patient Time For Goal Achievement: 12/20/15 Potential to Achieve Goals: Good  OT Frequency: Min 2X/week   Barriers to D/C:            Co-evaluation              End of Session Nurse Communication: Mobility status;Precautions  Activity Tolerance: Patient tolerated treatment well Patient left: in bed;with call bell/phone within reach;with bed alarm set (SCD removed and RN made aware)   Time: [id number] OT Time Calculation (min): 32 min Charges:  OT General Charges $OT Visit: 1 Procedure OT Evaluation $OT Eval Moderate Complexity: 1 Procedure OT Treatments $Self Care/Home Management : 8-22 mins G-Codes:    Parke Poisson B 12-30-15, 8:52 AM   Jeri Modena   OTR/L [phone] Office: [phone] .

## 2015-12-07 LAB — CBC
HCT: 24.7 % — ABNORMAL LOW (ref 39.0–52.0)
Hemoglobin: 8.7 g/dL — ABNORMAL LOW (ref 13.0–17.0)
MCH: 30.1 pg (ref 26.0–34.0)
MCHC: 35.2 g/dL (ref 30.0–36.0)
MCV: 85.5 fL (ref 78.0–100.0)
PLATELETS: 146 10*3/uL — AB (ref 150–400)
RBC: 2.89 MIL/uL — ABNORMAL LOW (ref 4.22–5.81)
RDW: 13 % (ref 11.5–15.5)
WBC: 7.8 10*3/uL (ref 4.0–10.5)

## 2015-12-07 NOTE — Progress Notes (Signed)
Occupational Therapy Treatment Patient Details Name: Andrew Wilkins MRN: [id number] DOB: [birth year] Today's Date: 12/07/2015    History of present illness Bil THR   OT comments  Wife will obtain AE  Follow Up Recommendations  No OT follow up    Equipment Recommendations  3 in 1 bedside comode       Precautions / Restrictions Precautions Precautions: Fall Restrictions Weight Bearing Restrictions: No Other Position/Activity Restrictions: WBAT       Mobility Bed Mobility Overal bed mobility: Needs Assistance Bed Mobility: Supine to Sit     Supine to sit: Mod assist     General bed mobility comments: pt in chair  Transfers Overall transfer level: Needs assistance Equipment used: Rolling walker (2 wheeled) Transfers: Sit to/from Stand Sit to Stand: Min guard         General transfer comment: pt sitting comfortably.  AE education provided in sitting        ADL Overall ADL's : Needs assistance/impaired             Lower Body Bathing: Cueing for safety;Cueing for sequencing;With adaptive equipment;Moderate assistance Lower Body Bathing Details (indicate cue type and reason): simulated with AE           Toilet Transfer Details (indicate cue type and reason): verbalized safety. OT obtained a wide BSC to go in pts bathroom for comfort.           General ADL Comments: Pt sitting in chair.  Simulated dressing with AE. Pt has some AE and will obtain rest of AE.  Educated on strategies for dressing and ADL activity.                Cognition   Behavior During Therapy: WFL for tasks assessed/performed Overall Cognitive Status: Within Functional Limits for tasks assessed                       Extremity/Trunk Assessment                          Pertinent Vitals/ Pain       Pain Assessment: 0-10 Pain Score: 4  Faces Pain Scale: Hurts little more Pain Location: B hips Pain Descriptors / Indicators: Sore Pain Intervention(s):  Monitored during session  Home Living                                              Frequency Min 2X/week     Progress Toward Goals  OT Goals(current goals can now be found in the care plan section)  Progress towards OT goals: Progressing toward goals     Plan Discharge plan remains appropriate       End of Session     Activity Tolerance Patient tolerated treatment well   Patient Left in chair           Time: 1240-1312 OT Time Calculation (min): 32 min  Charges: OT General Charges $OT Visit: 1 Procedure OT Treatments $Self Care/Home Management : 23-37 mins  Antanisha Mohs, Edwena Felty D 12/07/2015, 1:23 PM

## 2015-12-07 NOTE — Progress Notes (Signed)
Physical Therapy Treatment Patient Details Name: Andrew Wilkins MRN: [id number] DOB: [birth year] Today's Date: 12/07/2015    History of Present Illness Bil THR    PT Comments    Progressing with mobility. Continues to c/o "tightness/stiffness".   Follow Up Recommendations  Home health PT     Equipment Recommendations  None recommended by PT    Recommendations for Other Services       Precautions / Restrictions Precautions Precautions: Fall Restrictions Weight Bearing Restrictions: No Other Position/Activity Restrictions: WBAT    Mobility  Bed Mobility Overal bed mobility: Needs Assistance Bed Mobility: Supine to Sit     Supine to sit: Mod assist     General bed mobility comments: pt in chair  Transfers Overall transfer level: Needs assistance Equipment used: Rolling walker (2 wheeled) Transfers: Sit to/from Stand Sit to Stand: Min guard         General transfer comment: close guard for safety. Increased time  Ambulation/Gait Ambulation/Gait assistance: Min guard Ambulation Distance (Feet): 80 Feet Assistive device: Rolling walker (2 wheeled) Gait Pattern/deviations: Step-to pattern;Step-through pattern;Decreased step length - right;Decreased stride length Gait velocity: decr   General Gait Details: slow gait speed. difficulty with swing through on R LE. several brief standing rest breaks to rest UEs.    Stairs            Wheelchair Mobility    Modified Rankin (Stroke Patients Only)       Balance                                    Cognition Arousal/Alertness: Awake/alert Behavior During Therapy: WFL for tasks assessed/performed Overall Cognitive Status: Within Functional Limits for tasks assessed                      Exercises Total Joint Exercises Heel Slides: AAROM;Both;Supine;15 reps Hip ABduction/ADduction: AAROM;Both;15 reps;Supine Long Arc Quad: AROM;Both;10 reps;Seated    General Comments         Pertinent Vitals/Pain Pain Assessment: 0-10 Pain Score: 5  Faces Pain Scale: Hurts little more Pain Location: bil hips Pain Descriptors / Indicators: Sore;Tightness Pain Intervention(s): Monitored during session    Home Living                      Prior Function            PT Goals (current goals can now be found in the care plan section) Progress towards PT goals: Progressing toward goals    Frequency  7X/week    PT Plan Current plan remains appropriate    Co-evaluation             End of Session Equipment Utilized During Treatment: Gait belt Activity Tolerance: Patient tolerated treatment well Patient left: in chair;with call bell/phone within reach;with family/visitor present     Time: [id number] PT Time Calculation (min) (ACUTE ONLY): 42 min  Charges:  $Gait Training: 23-37 mins $Therapeutic Exercise: 8-22 mins $Therapeutic Activity: 8-22 mins                    G Codes:      Andrew Wilkins, MPT Pager: [phone]

## 2015-12-07 NOTE — Progress Notes (Signed)
Physical Therapy Treatment Patient Details Name: Andrew Wilkins MRN: [id number] DOB: Oct 14, 1965 Today's Date: 12/07/2015    History of Present Illness Bil THR    PT Comments    POD # 2 am session Assisted OOB to amb a greater distance.  Very slow gait with max c/o "tightness".  One standing rest break.  BP mid gait 116/84.  Feels "better".  Some fatigue.  Returned to room then performed B THR TE's followed by ICE.   Pt plans to D/C to home tomorrow.   Follow Up Recommendations  Home health PT     Equipment Recommendations  None recommended by PT    Recommendations for Other Services       Precautions / Restrictions Precautions Precautions: Fall Restrictions Weight Bearing Restrictions: No Other Position/Activity Restrictions: WBAT    Mobility  Bed Mobility Overal bed mobility: Needs Assistance Bed Mobility: Supine to Sit     Supine to sit: Mod assist     General bed mobility comments: assist B LE off bed with increased time  Transfers Overall transfer level: Needs assistance Equipment used: Rolling walker (2 wheeled) Transfers: Sit to/from Stand Sit to Stand: Min guard         General transfer comment: increased time  Ambulation/Gait Ambulation/Gait assistance: Supervision;Min guard Ambulation Distance (Feet): 84 Feet Assistive device: Rolling walker (2 wheeled) Gait Pattern/deviations: Step-to pattern;Step-through pattern;Decreased step length - right;Decreased step length - left;Wide base of support Gait velocity: decr   General Gait Details: tolerated increased distance and demonstarating increased B knee flex during swing phase   Stairs            Wheelchair Mobility    Modified Rankin (Stroke Patients Only)       Balance                                    Cognition Arousal/Alertness: Awake/alert Behavior During Therapy: WFL for tasks assessed/performed Overall Cognitive Status: Within Functional Limits for tasks  assessed                      Exercises   Total Hip Replacement TE's 10 reps ankle pumps 10 reps knee presses 10 reps heel slides 10 reps SAQ's 10 reps ABD Followed by ICE     General Comments        Pertinent Vitals/Pain Pain Assessment: 0-10 Pain Score: 6  Pain Location: B hips L>R more "stiffness" Pain Descriptors / Indicators: Discomfort;Sore;Tightness Pain Intervention(s): Monitored during session;Premedicated before session;Repositioned;Ice applied    Home Living                      Prior Function            PT Goals (current goals can now be found in the care plan section) Progress towards PT goals: Progressing toward goals    Frequency  7X/week    PT Plan Current plan remains appropriate    Co-evaluation             End of Session Equipment Utilized During Treatment: Gait belt Activity Tolerance: Patient tolerated treatment well Patient left: in chair;with call bell/phone within reach;with family/visitor present     Time: 1208-1255 PT Time Calculation (min) (ACUTE ONLY): 47 min  Charges:  $Gait Training: 8-22 mins $Therapeutic Exercise: 8-22 mins $Therapeutic Activity: 8-22 mins  G Codes:      Rica Koyanagi  PTA [facility]  Acute  Rehab Pager      [phone]

## 2015-12-07 NOTE — Progress Notes (Signed)
Subjective: 3 Days Post-Op Procedure(s) (LRB): BILATERAL ANTERIOR APPROACH TOTAL HIP ARTHROPLASTY (Bilateral) Patient reports pain as moderate.  Slow progress with therapy wince bilateral hips.  Hgb at 8.7, same as previous day.  Vitals stable.  Objective: Vital signs in last 24 hours: Temp:  [97.6 F (36.4 C)-98.5 F (36.9 C)] 98.5 F (36.9 C) (07/03 0538) Pulse Rate:  [84-100] 84 (07/03 0538) Resp:  [16] 16 (07/03 0538) BP: (102-110)/(56-62) 109/56 mmHg (07/03 0538) SpO2:  [96 %-100 %] 96 % (07/03 0538)  Intake/Output from previous day: 07/02 0701 - 07/03 0700 In: 2011.3 [P.O.:720; I.V.:1291.3] Out: 1925 [id number] Intake/Output this shift:     Recent Labs  12/05/15 0431 12/06/15 0406 12/07/15 0435  HGB 10.3* 8.7* 8.7*    Recent Labs  12/06/15 0406 12/07/15 0435  WBC 7.3 7.8  RBC 2.92* 2.89*  HCT 24.9* 24.7*  PLT 147* 146*    Recent Labs  12/05/15 0431  NA 135  K 3.9  CL 104  CO2 27  BUN 17  CREATININE 0.90  GLUCOSE 164*  CALCIUM 8.0*   No results for input(s): LABPT, INR in the last 72 hours.  Sensation intact distally Intact pulses distally Dorsiflexion/Plantar flexion intact Incision: scant drainage  Assessment/Plan: 3 Days Post-Op Procedure(s) (LRB): BILATERAL ANTERIOR APPROACH TOTAL HIP ARTHROPLASTY (Bilateral) Up with therapy Plan for discharge tomorrow Discharge home with home health  Mcarthur Rossetti 12/07/2015, 7:11 AM

## 2015-12-07 NOTE — Care Management Note (Signed)
Case Management Note  Patient Details  Name: Andrew Wilkins MRN: [id number] Date of Birth: Jan 14, 1966  Subjective/Objective:   50 yo admitted for Bilateral direct anterior total hip arthroplasties                 Action/Plan: From home with spouse.  Pt states he has a RW, tub bench and BSC at home already.  PT recommending HHPT at discharge.  Pt offered choice for [facility] services and [facility] chosen.  [facility] rep contacted for referral. No other CM needs communicated.  Expected Discharge Date:                  Expected Discharge Plan:  [place]  In-House Referral:     Discharge planning Services  CM Consult  Post Acute Care Choice:  Home Health Choice offered to:  Patient  DME Arranged:    DME Agency:     HH Arranged:  PT [place]:  [place]  Status of Service:  In process, will continue to follow  If discussed at Long Length of Stay Meetings, dates discussed:    Additional CommentsLynnell Catalan, RN 12/07/2015, 2:53 PM  [phone]

## 2015-12-08 MED ORDER — ASPIRIN 325 MG PO TBEC: 325.0000 mg | DELAYED_RELEASE_TABLET | Freq: Two times a day (BID) | ORAL | Status: DC

## 2015-12-08 MED ORDER — METHOCARBAMOL 500 MG PO TABS: 500.0000 mg | ORAL_TABLET | Freq: Four times a day (QID) | ORAL | Status: DC | PRN

## 2015-12-08 MED ORDER — HYDROCODONE-ACETAMINOPHEN 5-325 MG PO TABS: 1.0000 | ORAL_TABLET | ORAL | Status: DC | PRN

## 2015-12-08 NOTE — Progress Notes (Signed)
Occupational Therapy Treatment Patient Details Name: Andrew Wilkins MRN: [id number] DOB: 1966-01-14 Today's Date: 12/08/2015    History of present illness Bil THR   OT comments  All education completed this session.  Pt used AE to get dressed for discharge  Follow Up Recommendations  No OT follow up    Equipment Recommendations  3 in 1 bedside comode (pt states he is borrowing this and shower seat)    Recommendations for Other Services      Precautions / Restrictions Precautions Precautions: Fall Restrictions Other Position/Activity Restrictions: WBAT       Mobility Bed Mobility               General bed mobility comments: pt in chair  Transfers     Transfers: Sit to/from Stand Sit to Stand: Supervision         General transfer comment: cues for LE placement when sitting    Balance                                   ADL                       Lower Body Dressing: Minimal assistance;Sit to/from stand;With adaptive equipment                 General ADL Comments: donned pants, underwear, socks and shoes with AE.  Needed assistance for L shoe only.  Pt verbalizes comfort with toilet and shower transfers.  Did not want to practice prior to discharge      Vision                     Perception     Praxis      Cognition   Behavior During Therapy: [facility] for tasks assessed/performed Overall Cognitive Status: Within Functional Limits for tasks assessed                       Extremity/Trunk Assessment               Exercises     Shoulder Instructions       General Comments      Pertinent Vitals/ Pain       Pain Score: 4  Pain Location: bil hips Pain Descriptors / Indicators: Sore Pain Intervention(s): Limited activity within patient's tolerance;Monitored during session;Premedicated before session;Repositioned  Home Living                                          Prior  Functioning/Environment              Frequency       Progress Toward Goals  OT Goals(current goals can now be found in the care plan section)  Progress towards OT goals:  (pt discharging today; does not need further OT)     Plan      Co-evaluation                 End of Session     Activity Tolerance Patient tolerated treatment well   Patient Left in chair;with family/visitor present   Nurse Communication          Time: 1000-1017 OT Time Calculation (min): 17 min  Charges: OT General Charges $OT Visit: 1  Procedure OT Treatments $Self Care/Home Management : 8-22 mins  Keiry Kowal 12/08/2015, 10:47 AM  Lesle Chris, OTR/L [phone] 12/08/2015

## 2015-12-08 NOTE — Discharge Summary (Signed)
Patient ID: Andrew Wilkins MRN: [id number] DOB/AGE: [birth year] 50 y.o.  Admit date: 12/04/2015 Discharge date: 12/08/2015  Admission Diagnoses:  Principal Problem:   Osteoarthritis of bilateral hips Active Problems:   Status post bilateral hip replacements   Discharge Diagnoses:  Same  Past Medical History  Diagnosis Date  . Hyperlipidemia   . GERD (gastroesophageal reflux disease)   . Hypertension   . BPH (benign prostatic hyperplasia)   . Plantar fasciitis     cortisone injection  . Kidney stone   . Arthritis     Surgeries: Procedure(s): BILATERAL ANTERIOR APPROACH TOTAL HIP ARTHROPLASTY on 12/04/2015   Consultants:    Discharged Condition: Improved  Hospital Course: Andrew Wilkins is an 50 y.o. male who was admitted 12/04/2015 for operative treatment ofOsteoarthritis of left hip. Patient has severe unremitting pain that affects sleep, daily activities, and work/hobbies. After pre-op clearance the patient was taken to the operating room on 12/04/2015 and underwent  Procedure(s): BILATERAL ANTERIOR APPROACH TOTAL HIP ARTHROPLASTY.    Patient was given perioperative antibiotics: Anti-infectives    Start     Dose/Rate Route Frequency Ordered Stop   12/04/15 2100  ceFAZolin (ANCEF) IVPB 2g/100 mL premix     2 g 200 mL/hr over 30 Minutes Intravenous Every 6 hours 12/04/15 1857 12/05/15 0252   12/04/15 1300  ceFAZolin (ANCEF) 3 g in dextrose 5 % 50 mL IVPB     3 g 130 mL/hr over 30 Minutes Intravenous On call to O.R. 12/04/15 1252 12/04/15 1450       Patient was given sequential compression devices, early ambulation, and chemoprophylaxis to prevent DVT.  Patient benefited maximally from hospital stay and there were no complications.    Recent vital signs: Patient Vitals for the past 24 hrs:  BP Temp Temp src Pulse Resp SpO2  12/08/15 0515 (!) 106/51 mmHg 97.6 F (36.4 C) Oral 78 16 98 %  12/08/15 0158 - 99.3 F (37.4 C) Oral - - -  12/07/15 2139 117/64 mmHg 100  F (37.8 C) Oral 88 16 99 %  12/07/15 1406 (!) 117/58 mmHg 98.8 F (37.1 C) Oral 88 16 98 %     Recent laboratory studies:  Recent Labs  12/06/15 0406 12/07/15 0435  WBC 7.3 7.8  HGB 8.7* 8.7*  HCT 24.9* 24.7*  PLT 147* 146*     Discharge Medications:     Medication List    TAKE these medications        acetaminophen 500 MG tablet  Commonly known as:  TYLENOL  Take 1,000 mg by mouth daily.     alfuzosin 10 MG 24 hr tablet  Commonly known as:  UROXATRAL  Take 10 mg by mouth daily with breakfast.     aspirin 325 MG EC tablet  Take 1 tablet (325 mg total) by mouth 2 (two) times daily after a meal.     azelastine 0.1 % nasal spray  Commonly known as:  ASTELIN  Place 1 spray into both nostrils 2 (two) times daily. Use in each nostril as directed     celecoxib 100 MG capsule  Commonly known as:  CELEBREX  Take 1 capsule (100 mg total) by mouth 2 (two) times daily as needed.     diazepam 10 MG tablet  Commonly known as:  VALIUM  Take 1 tablet (10 mg total) by mouth every 8 (eight) hours as needed.     fish oil-omega-3 fatty acids 1000 MG capsule  Take 2 g by mouth daily.  folic acid 1 MG tablet  Commonly known as:  FOLVITE  Take 1 tablet by mouth  daily     HYDROcodone-acetaminophen 5-325 MG tablet  Commonly known as:  NORCO  Take 1-2 tablets by mouth every 4 (four) hours as needed for moderate pain.     losartan-hydrochlorothiazide 50-12.5 MG tablet  Commonly known as:  HYZAAR  Take 1 tablet by mouth  daily     methocarbamol 500 MG tablet  Commonly known as:  ROBAXIN  Take 1 tablet (500 mg total) by mouth every 6 (six) hours as needed for muscle spasms.     multivitamin with minerals Tabs tablet  Take 1 tablet by mouth daily.     RABEprazole 20 MG tablet  Commonly known as:  ACIPHEX  Take 1 tablet by mouth  daily     rosuvastatin 10 MG tablet  Commonly known as:  CRESTOR  Take 1 tablet by mouth  every day     traMADol 50 MG tablet  Commonly  known as:  ULTRAM  Take 1 tablet (50 mg total) by mouth every 8 (eight) hours as needed.        Diagnostic Studies: Dg Pelvis Portable  12/04/2015  CLINICAL DATA:  Status post bilateral hip replacement EXAM: PORTABLE PELVIS 1-2 VIEWS COMPARISON:  None. FINDINGS: Bilateral hip replacements are seen. No acute bony abnormality is noted. No gross soft tissue abnormality is noted. IMPRESSION: Bilateral hip replacements.  No acute abnormality noted. Electronically Signed   By: Inez Catalina M.D.   On: 12/04/2015 19:12   Dg C-arm Gt 120 Min-no Report  12/04/2015  CLINICAL DATA: surgery C-ARM GT 120 MINUTE Fluoroscopy was utilized by the requesting physician.  No radiographic interpretation.   Dg Hip Operative Unilat With Pelvis Left  12/04/2015  CLINICAL DATA:  Left hip replacement EXAM: OPERATIVE LEFT HIP WITH PELVIS COMPARISON:  None. FLUOROSCOPY TIME:  Radiation Exposure Index (as provided by the fluoroscopic device): 4.47 mGy If the device does not provide the exposure index: Fluoroscopy Time:  33 seconds Number of Acquired Images:  2 FINDINGS: Left hip prosthesis is noted.  No acute bony abnormality is seen. IMPRESSION: Status post left hip prosthesis. No acute bony abnormality is noted. Electronically Signed   By: Inez Catalina M.D.   On: 12/04/2015 18:22   Dg Hip Operative Unilat With Pelvis Right  12/04/2015  CLINICAL DATA:  Right hip replacement EXAM: OPERATIVE right HIP (WITH PELVIS IF PERFORMED) 2 VIEWS TECHNIQUE: Fluoroscopic spot image(s) were submitted for interpretation post-operatively. COMPARISON:  None. FINDINGS: Two views of the right hip submitted. There is right hip prosthesis with anatomic alignment. Fluoroscopy time was 37 seconds.  Patient's dose 3.79 mGy. IMPRESSION: Right hip prosthesis with anatomic alignment. Please see the operative report. Electronically Signed   By: Lahoma Crocker M.D.   On: 12/04/2015 16:21    Disposition: 01-Home or Self Care      Discharge Instructions     Call MD / Call 911    Complete by:  As directed   If you experience chest pain or shortness of breath, CALL 911 and be transported to the hospital emergency room.  If you develope a fever above 101 F, pus (white drainage) or increased drainage or redness at the wound, or calf pain, call your surgeon's office.     Constipation Prevention    Complete by:  As directed   Drink plenty of fluids.  Prune juice may be helpful.  You may use a stool softener,  such as Colace (over the counter) 100 mg twice a day.  Use MiraLax (over the counter) for constipation as needed.     Diet - low sodium heart healthy    Complete by:  As directed      Discharge patient    Complete by:  As directed      Increase activity slowly as tolerated    Complete by:  As directed            Follow-up Information    Follow up with Mcarthur Rossetti, MD In 2 weeks.   Specialty:  Orthopedic Surgery   Contact information:   [place] [address] [phone]        Signed: Mcarthur Rossetti 12/08/2015, 9:37 AM

## 2015-12-08 NOTE — Progress Notes (Signed)
Patient ID: Andrew Wilkins, male   DOB: 1965-06-21, 50 y.o.   MRN: [id number] Doing well overall and feeling better.  Bilateral hips stable.  Vitals stable.  Can be discharged to home today.

## 2015-12-08 NOTE — Progress Notes (Signed)
Physical Therapy Treatment Patient Details Name: Andrew Wilkins MRN: [id number] DOB: 11-20-1965 Today's Date: 12/08/2015    History of Present Illness Bil THR    PT Comments    POD # 4 Assisted OOB to amb a greater distance in hallway.  Noted increased hip and knee flexion.  Practiced one step then performed B LE THR TE's followed by ICE.   Pt ready for D/C to home.  Follow Up Recommendations  Home health PT     Equipment Recommendations  None recommended by PT    Recommendations for Other Services       Precautions / Restrictions Precautions Precautions: Fall Restrictions Weight Bearing Restrictions: No Other Position/Activity Restrictions: WBAT    Mobility  Bed Mobility Overal bed mobility: Needs Assistance Bed Mobility: Supine to Sit     Supine to sit: Min assist;Min guard     General bed mobility comments: increased time and assist B LE  Transfers Overall transfer level: Needs assistance Equipment used: Rolling walker (2 wheeled) Transfers: Sit to/from Stand Sit to Stand: Supervision         General transfer comment: cues for LE placement when sitting  Ambulation/Gait Ambulation/Gait assistance: Supervision;Min guard Ambulation Distance (Feet): 95 Feet Assistive device: Rolling walker (2 wheeled) Gait Pattern/deviations: Step-to pattern;Decreased step length - right;Decreased step length - left Gait velocity: decr   General Gait Details: noted increased hip and knee flexion.   Stairs Stairs: Yes Stairs assistance: Min guard Stair Management: No rails;Step to pattern;Forwards;With walker Number of Stairs: 1 General stair comments: 25% VC's on proper walker placement and sequencing  Wheelchair Mobility    Modified Rankin (Stroke Patients Only)       Balance                                    Cognition                            Exercises   Total Hip Replacement TE's 10 reps ankle pumps 10 reps knee  presses 10 reps heel slides 10 reps SAQ's 10 reps ABD Followed by ICE     General Comments        Pertinent Vitals/Pain Pain Assessment: 0-10 Pain Score: 4  Pain Location: B hips Pain Descriptors / Indicators: Sore;Tightness Pain Intervention(s): Monitored during session;Premedicated before session;Repositioned;Ice applied    Home Living                      Prior Function            PT Goals (current goals can now be found in the care plan section) Progress towards PT goals: Progressing toward goals    Frequency  7X/week    PT Plan Current plan remains appropriate    Co-evaluation             End of Session Equipment Utilized During Treatment: Gait belt Activity Tolerance: Patient tolerated treatment well Patient left: in chair;with call bell/phone within reach;with family/visitor present     Time: [id number] PT Time Calculation (min) (ACUTE ONLY): 55 min  Charges:  $Gait Training: 8-22 mins $Therapeutic Exercise: 8-22 mins $Therapeutic Activity: 8-22 mins                    G Codes:      Rica Koyanagi  PTA [facility]  Acute  Rehab Pager      [phone]

## 2015-12-31 ENCOUNTER — Other Ambulatory Visit (INDEPENDENT_AMBULATORY_CARE_PROVIDER_SITE_OTHER): Payer: 59

## 2015-12-31 DIAGNOSIS — Z0000 Encounter for general adult medical examination without abnormal findings: Secondary | ICD-10-CM | POA: Diagnosis not present

## 2015-12-31 LAB — LIPID PANEL
CHOL/HDL RATIO: 3
Cholesterol: 99 mg/dL (ref 0–200)
HDL: 30.9 mg/dL — AB (ref >39.00)
LDL Cholesterol: 54 mg/dL (ref 0–99)
NONHDL: 68.32
Triglycerides: 74 mg/dL (ref 0.0–149.0)
VLDL: 14.8 mg/dL (ref 0.0–40.0)

## 2015-12-31 LAB — CBC WITH DIFFERENTIAL/PLATELET
BASOS ABS: 0 10*3/uL (ref 0.0–0.1)
Basophils Relative: 0.4 % (ref 0.0–3.0)
EOS ABS: 0.1 10*3/uL (ref 0.0–0.7)
Eosinophils Relative: 2.5 % (ref 0.0–5.0)
HCT: 35.4 % — ABNORMAL LOW (ref 39.0–52.0)
Hemoglobin: 11.9 g/dL — ABNORMAL LOW (ref 13.0–17.0)
LYMPHS ABS: 1.2 10*3/uL (ref 0.7–4.0)
Lymphocytes Relative: 28.7 % (ref 12.0–46.0)
MCHC: 33.6 g/dL (ref 30.0–36.0)
MCV: 84.3 fl (ref 78.0–100.0)
MONO ABS: 0.2 10*3/uL (ref 0.1–1.0)
Monocytes Relative: 5.9 % (ref 3.0–12.0)
NEUTROS ABS: 2.6 10*3/uL (ref 1.4–7.7)
NEUTROS PCT: 62.5 % (ref 43.0–77.0)
PLATELETS: 260 10*3/uL (ref 150.0–400.0)
RBC: 4.2 Mil/uL — ABNORMAL LOW (ref 4.22–5.81)
RDW: 14.2 % (ref 11.5–15.5)
WBC: 4.2 10*3/uL (ref 4.0–10.5)

## 2015-12-31 LAB — POC URINALSYSI DIPSTICK (AUTOMATED)
Bilirubin, UA: NEGATIVE
Blood, UA: NEGATIVE
GLUCOSE UA: NEGATIVE
Ketones, UA: NEGATIVE
Leukocytes, UA: NEGATIVE
Nitrite, UA: NEGATIVE
Protein, UA: NEGATIVE
SPEC GRAV UA: 1.025
UROBILINOGEN UA: 0.2
pH, UA: 5.5

## 2015-12-31 LAB — HEPATIC FUNCTION PANEL
ALBUMIN: 4.3 g/dL (ref 3.5–5.2)
ALK PHOS: 114 U/L (ref 39–117)
ALT: 26 U/L (ref 0–53)
AST: 17 U/L (ref 0–37)
BILIRUBIN DIRECT: 0.1 mg/dL (ref 0.0–0.3)
Total Bilirubin: 0.5 mg/dL (ref 0.2–1.2)
Total Protein: 6.3 g/dL (ref 6.0–8.3)

## 2015-12-31 LAB — BASIC METABOLIC PANEL
BUN: 19 mg/dL (ref 6–23)
CHLORIDE: 107 meq/L (ref 96–112)
CO2: 30 meq/L (ref 19–32)
Calcium: 9.6 mg/dL (ref 8.4–10.5)
Creatinine, Ser: 0.8 mg/dL (ref 0.40–1.50)
GFR: 108.35 mL/min (ref >60.00)
Glucose, Bld: 93 mg/dL (ref 70–99)
POTASSIUM: 4.6 meq/L (ref 3.5–5.1)
SODIUM: 142 meq/L (ref 135–145)

## 2015-12-31 LAB — TSH: TSH: 1.15 u[IU]/mL (ref 0.35–4.50)

## 2015-12-31 LAB — PSA: PSA: 10.03 ng/mL — ABNORMAL HIGH (ref 0.10–4.00)

## 2016-01-12 ENCOUNTER — Ambulatory Visit (INDEPENDENT_AMBULATORY_CARE_PROVIDER_SITE_OTHER): Payer: 59 | Admitting: Family Medicine

## 2016-01-12 ENCOUNTER — Encounter: Payer: Self-pay | Admitting: Family Medicine

## 2016-01-12 VITALS — BP 128/86 | HR 85 | Temp 98.2°F | Ht 73.5 in | Wt 258.2 lb

## 2016-01-12 DIAGNOSIS — Z0000 Encounter for general adult medical examination without abnormal findings: Secondary | ICD-10-CM | POA: Diagnosis not present

## 2016-01-12 NOTE — Progress Notes (Signed)
Phone: [phone]  Subjective:  Patient presents today for their annual physical. Chief complaint-noted.   See problem oriented charting- ROS- full  review of systems was completed and negative except for: bilateral hip pain at times with movement 1-2. Painful to walk a while- otherwise much improved from presurgery. Mild knee pain.   The following were reviewed and entered/updated in epic: Past Medical History:  Diagnosis Date  . Arthritis   . BPH (benign prostatic hyperplasia)   . GERD (gastroesophageal reflux disease)   . Hyperlipidemia   . Hypertension   . Kidney stone   . Plantar fasciitis    cortisone injection   Patient Active Problem List   Diagnosis Date Noted  . Hyperglycemia 10/05/2015    Priority: Medium  . BPH (benign prostatic hyperplasia) 04/02/2014    Priority: Medium  . Family history of peripheral vascular disease 03/17/2011    Priority: Medium  . Hyperlipidemia 02/26/2007    Priority: Medium  . Essential hypertension 02/26/2007    Priority: Medium  . Osteoarthritis 02/26/2007    Priority: Medium  . Status post bilateral hip replacements 12/04/2015    Priority: Low  . Hemangioma of liver 04/02/2014    Priority: Low  . Nephrolithiasis 04/02/2014    Priority: Low  . Lipoma 07/08/2009    Priority: Low  . GERD 06/18/2007    Priority: Low  . INSOMNIA, PERSISTENT 02/26/2007    Priority: Low   Past Surgical History:  Procedure Laterality Date  . BILATERAL ANTERIOR TOTAL HIP ARTHROPLASTY Bilateral 12/04/2015   Procedure: BILATERAL ANTERIOR APPROACH TOTAL HIP ARTHROPLASTY;  Surgeon: Mcarthur Rossetti, MD;  Location: [facility];  Service: Orthopedics;  Laterality: Bilateral;  . left eye surgery     left eye surgery-squint surgery-age 47  . none    . TONSILLECTOMY     age 89    Family History  Problem Relation Age of Onset  . Lung cancer Father     former smoker  . Heart disease Father 18    PAD (carotid endarterectomy at 57) and CAD with MI in  late 55s early 39s.   . Heart disease Sister 69    mi, smoker  . Crohn's disease Daughter   . Stroke Paternal Grandfather 58  . Stroke Brother 59    physician who stopped practicing as result    Medications- reviewed and updated Current Outpatient Prescriptions  Medication Sig Dispense Refill  . acetaminophen (TYLENOL) 500 MG tablet Take 1,000 mg by mouth daily.    Marland Kitchen alfuzosin (UROXATRAL) 10 MG 24 hr tablet Take 10 mg by mouth daily with breakfast.    . aspirin EC 81 MG tablet Take 81 mg by mouth daily.    Marland Kitchen azelastine (ASTELIN) 0.1 % nasal spray Place 1 spray into both nostrils 2 (two) times daily. Use in each nostril as directed (Patient taking differently: Place 1 spray into both nostrils 2 (two) times daily as needed for rhinitis (seasonal rhinitis). Use in each nostril as directed) 30 mL 11  . diazepam (VALIUM) 10 MG tablet Take 1 tablet (10 mg total) by mouth every 8 (eight) hours as needed. (Patient taking differently: Take 10 mg by mouth every 8 (eight) hours as needed (back spasm). ) 30 tablet 0  . fish oil-omega-3 fatty acids 1000 MG capsule Take 2 g by mouth daily.      . folic acid (FOLVITE) 1 MG tablet Take 1 tablet by mouth  daily 90 tablet 2  . losartan-hydrochlorothiazide (HYZAAR) 50-12.5 MG tablet  Take 1 tablet by mouth  daily 90 tablet 2  . methocarbamol (ROBAXIN) 500 MG tablet Take 1 tablet (500 mg total) by mouth every 6 (six) hours as needed for muscle spasms. 60 tablet 0  . Multiple Vitamin (MULTIVITAMIN WITH MINERALS) TABS tablet Take 1 tablet by mouth daily.    . RABEprazole (ACIPHEX) 20 MG tablet Take 1 tablet by mouth  daily 90 tablet 2  . rosuvastatin (CRESTOR) 10 MG tablet Take 1 tablet by mouth  every day 30 tablet 2  . traMADol (ULTRAM) 50 MG tablet Take 1 tablet (50 mg total) by mouth every 8 (eight) hours as needed. 30 tablet 2  . celecoxib (CELEBREX) 100 MG capsule Take 1 capsule (100 mg total) by mouth 2 (two) times daily as needed. (Patient not taking:  Reported on 01/12/2016) 60 capsule 5  . HYDROcodone-acetaminophen (NORCO) 5-325 MG tablet Take 1-2 tablets by mouth every 4 (four) hours as needed for moderate pain. (Patient not taking: Reported on 01/12/2016) 60 tablet 0   No current facility-administered medications for this visit.     Allergies-reviewed and updated No Known Allergies  Social History   Social History  . Marital status: Married    Spouse name: N/A  . Number of children: N/A  . Years of education: N/A   Social History Main Topics  . Smoking status: Never Smoker  . Smokeless tobacco: None  . Alcohol use Yes     Comment: 3 per month  . Drug use: No  . Sexual activity: Yes   Other Topics Concern  . None   Social History Narrative   Married 1990. 3 kids- 68-58 years old in 2015.    New dog in 2015-walking regularly. 3.5-4 hour walks on weekend.       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 Oropharynx normal Neck: no thyromegaly CV: RRR no murmurs rubs or gallops Lungs: CTAB no crackles, wheeze, rhonchi Abdomen: soft/nontender/nondistended/normal bowel sounds. No rebound or guarding.  Well healing surgical scars bilateral hips Ext: no edema Skin: warm, dry Neuro: grossly normal, moves all extremities, PERRLA  Assessment/Plan:  50 y.o. male presenting for annual physical.  Health Maintenance counseling: 1. Anticipatory guidance: Patient counseled regarding regular dental exams, eye exams, wearing seatbelts.  2. Risk factor reduction:  Advised  patient of need for regular exercise and diet rich and fruits and vegetables to reduce risk of heart attack and stroke.  Wt Readings from Last 3 Encounters:  01/12/16 258 lb 3.2 oz (117.1 kg)  12/04/15 259 lb (117.5 kg)  11/26/15 259 lb 9.6 oz (117.8 kg)  3. Immunizations/screenings/ancillary studies Immunization History  Administered Date(s) Administered  . Influenza Whole 04/10/2009  . Influenza,inj,Quad PF,36+ Mos 03/29/2013  . Influenza-Unspecified 04/04/2014, 03/23/2015  . PPD Test 04/04/2014  . Td 04/06/2006   Health Maintenance Due  Topic Date Due  . INFLUENZA VACCINE - will get when available through work 01/05/2016   4. Prostate cancer  screening- managed by urology at this point= have sent most recent PSA to them. He had negative biopsy 2016.  Sees Dr. Diona Fanti September 20th.  Lab Results  Component Value Date   PSA 10.03 (H) 12/31/2015   PSA 8.65 08/10/2015   PSA 4.78 (H) 05/09/2014   5. Colon cancer screening - 05/07/15 with 10 year follow up 6. Skin cancer screening- [place] derm Dr. Elvera Lennox yearly basis  Status of chronic or acute concerns  OA- s/p recent bilateral hip replacement. Took 2 weeks off of work and now back. Hips should last 15-20 years  HLD- well controlled with crestor 10mg  with LDL <70, ideal with family history of PVD Lab Results  Component Value Date   CHOL 99 12/31/2015   HDL 30.90 (L) 12/31/2015   LDLCALC 54 12/31/2015   LDLDIRECT 56.0 10/05/2015   TRIG 74.0 12/31/2015   CHOLHDL 3 12/31/2015   HTN- on losartan-hctz 50-12.5mg  as well as alfuzosin (BPH symptoms)  Hyperglycemia-remains at risk for diabetes, peak a1c has been 5.9 Lab Results  Component Value Date   HGBA1C 5.7 10/05/2015   6 months.   Meds ordered this encounter  Medications  . aspirin EC 81 MG tablet    Sig: Take 81 mg by mouth daily.    Return precautions advised.   Garret Reddish, MD

## 2016-01-12 NOTE — Patient Instructions (Signed)
No changes in medication  I am thrilled you are doing so well after hip replacement  Hope things go well with urology  Update me when you get the flu shot

## 2016-01-12 NOTE — Progress Notes (Signed)
Pre visit review using our clinic review tool, if applicable. No additional management support is needed unless otherwise documented below in the visit note. 

## 2016-02-21 ENCOUNTER — Encounter: Payer: Self-pay | Admitting: Family Medicine

## 2016-02-24 ENCOUNTER — Encounter: Payer: Self-pay | Admitting: Family Medicine

## 2016-03-15 ENCOUNTER — Other Ambulatory Visit: Payer: Self-pay | Admitting: Family Medicine

## 2016-05-14 ENCOUNTER — Other Ambulatory Visit: Payer: Self-pay | Admitting: Family Medicine

## 2016-06-28 DIAGNOSIS — D2261 Melanocytic nevi of right upper limb, including shoulder: Secondary | ICD-10-CM | POA: Diagnosis not present

## 2016-06-28 DIAGNOSIS — D2222 Melanocytic nevi of left ear and external auricular canal: Secondary | ICD-10-CM | POA: Diagnosis not present

## 2016-06-28 DIAGNOSIS — D2272 Melanocytic nevi of left lower limb, including hip: Secondary | ICD-10-CM | POA: Diagnosis not present

## 2016-06-28 DIAGNOSIS — D485 Neoplasm of uncertain behavior of skin: Secondary | ICD-10-CM | POA: Diagnosis not present

## 2016-06-28 DIAGNOSIS — D1801 Hemangioma of skin and subcutaneous tissue: Secondary | ICD-10-CM | POA: Diagnosis not present

## 2016-07-03 ENCOUNTER — Encounter: Payer: Self-pay | Admitting: Family Medicine

## 2016-07-04 MED ORDER — OSELTAMIVIR PHOSPHATE 75 MG PO CAPS: 75.0000 mg | ORAL_CAPSULE | Freq: Two times a day (BID) | ORAL | 0 refills | Status: DC

## 2016-07-14 ENCOUNTER — Ambulatory Visit: Payer: 59 | Admitting: Family Medicine

## 2016-07-19 ENCOUNTER — Ambulatory Visit (INDEPENDENT_AMBULATORY_CARE_PROVIDER_SITE_OTHER): Payer: Self-pay | Admitting: Orthopaedic Surgery

## 2016-07-21 DIAGNOSIS — D0322 Melanoma in situ of left ear and external auricular canal: Secondary | ICD-10-CM | POA: Diagnosis not present

## 2016-07-21 DIAGNOSIS — D2222 Melanocytic nevi of left ear and external auricular canal: Secondary | ICD-10-CM | POA: Diagnosis not present

## 2016-07-29 ENCOUNTER — Encounter: Payer: Self-pay | Admitting: Family Medicine

## 2016-08-01 ENCOUNTER — Ambulatory Visit (INDEPENDENT_AMBULATORY_CARE_PROVIDER_SITE_OTHER): Payer: 59 | Admitting: Orthopaedic Surgery

## 2016-08-01 ENCOUNTER — Ambulatory Visit (INDEPENDENT_AMBULATORY_CARE_PROVIDER_SITE_OTHER): Payer: 59

## 2016-08-01 DIAGNOSIS — Z96643 Presence of artificial hip joint, bilateral: Secondary | ICD-10-CM | POA: Diagnosis not present

## 2016-08-01 NOTE — Progress Notes (Signed)
The patient is now 8 months status post bilateral hip replacements. he has no problems with these at all.  There are no complaints. He says he walks without a limp and he has no pain at all.  Both hips move fluidly without knee difficulties at all. His leg was are equal. Incisions well-healed. His neurovascular intact. An AP pelvis and lateral both hips are reviewed and show well-seated implant with no, getting features no evidence of loosening.  At this point follow-up as needed. I talked about things need to bring him back to [place] for his hips and we can always see him for anything else. He develops any type of dull aching pain I want to know about it and x-rays hips again.

## 2016-08-02 ENCOUNTER — Encounter: Payer: Self-pay | Admitting: Family Medicine

## 2016-08-02 ENCOUNTER — Ambulatory Visit (INDEPENDENT_AMBULATORY_CARE_PROVIDER_SITE_OTHER): Payer: 59 | Admitting: Family Medicine

## 2016-08-02 VITALS — BP 126/88 | HR 76 | Temp 97.6°F | Ht 73.5 in | Wt 274.2 lb

## 2016-08-02 DIAGNOSIS — D039 Melanoma in situ, unspecified: Secondary | ICD-10-CM

## 2016-08-02 DIAGNOSIS — D171 Benign lipomatous neoplasm of skin and subcutaneous tissue of trunk: Secondary | ICD-10-CM | POA: Diagnosis not present

## 2016-08-02 DIAGNOSIS — R972 Elevated prostate specific antigen [PSA]: Secondary | ICD-10-CM | POA: Diagnosis not present

## 2016-08-02 DIAGNOSIS — Z23 Encounter for immunization: Secondary | ICD-10-CM | POA: Diagnosis not present

## 2016-08-02 DIAGNOSIS — N401 Enlarged prostate with lower urinary tract symptoms: Secondary | ICD-10-CM | POA: Diagnosis not present

## 2016-08-02 DIAGNOSIS — Z86006 Personal history of melanoma in-situ: Secondary | ICD-10-CM | POA: Insufficient documentation

## 2016-08-02 DIAGNOSIS — R739 Hyperglycemia, unspecified: Secondary | ICD-10-CM

## 2016-08-02 DIAGNOSIS — E785 Hyperlipidemia, unspecified: Secondary | ICD-10-CM

## 2016-08-02 DIAGNOSIS — I10 Essential (primary) hypertension: Secondary | ICD-10-CM

## 2016-08-02 NOTE — Assessment & Plan Note (Signed)
S: controlled on Alfuzosin, losartan-hctz 50-12.5mg . .  BP Readings from Last 3 Encounters:  08/02/16 126/88  01/12/16 128/86  12/08/15 (!) 106/51  A/P:Continue current meds:  We discussed weight loss would be ideal to try to push diastolic down further

## 2016-08-02 NOTE — Progress Notes (Signed)
Pre visit review using our clinic review tool, if applicable. No additional management support is needed unless otherwise documented below in the visit note. 

## 2016-08-02 NOTE — Assessment & Plan Note (Signed)
S:Known BPH. Has had elevated PSA. Negative biopsy 2016. Following with Dr. Diona Fanti A/P: plan is for 6 month repeat PSA with [place]- he will come back for psa and free psa then see Dr. Diona Fanti for rectal exam and discussion of if repeat biopsy needed

## 2016-08-02 NOTE — Assessment & Plan Note (Signed)
S:Weight is a concern, as is family hx of DM. Likely time for a Hb A1C to monitor. Diet is okay, but inconsistent (states eat well but may not eat 3 meals a day at regular intervals and may overeat). Does not exercise regularly.  Wt Readings from Last 3 Encounters:  08/02/16 274 lb 3.2 oz (124.4 kg)  01/12/16 258 lb 3.2 oz (117.1 kg)  12/04/15 259 lb (117.5 kg)   Lab Results  Component Value Date   HGBA1C 5.7 10/05/2015    A/P: weight up 16 lbs. Update a1c. Encouraged need for healthy eating, regular exercise, weight loss.

## 2016-08-02 NOTE — Progress Notes (Signed)
Subjective:  Andrew Wilkins is a 51 y.o. year old very pleasant male patient who presents for/with See problem oriented charting ROS- No chest pain or shortness of breath. No headache or blurry vision. And none of these have occurred with exercise.    Past Medical History-  Patient Active Problem List   Diagnosis Date Noted  . Melanoma in situ ([place]) 08/02/2016    Priority: Medium  . Hyperglycemia 10/05/2015    Priority: Medium  . BPH (benign prostatic hyperplasia) 04/02/2014    Priority: Medium  . Family history of peripheral vascular disease 03/17/2011    Priority: Medium  . Lipoma of back 07/08/2009    Priority: Medium  . Hyperlipidemia 02/26/2007    Priority: Medium  . Essential hypertension 02/26/2007    Priority: Medium  . Osteoarthritis 02/26/2007    Priority: Medium  . Status post bilateral hip replacements 12/04/2015    Priority: Low  . Hemangioma of liver 04/02/2014    Priority: Low  . Nephrolithiasis 04/02/2014    Priority: Low  . GERD 06/18/2007    Priority: Low  . INSOMNIA, PERSISTENT 02/26/2007    Priority: Low    Medications- reviewed and updated Current Outpatient Prescriptions  Medication Sig Dispense Refill  . acetaminophen (TYLENOL) 500 MG tablet Take 1,000 mg by mouth daily.    Marland Kitchen alfuzosin (UROXATRAL) 10 MG 24 hr tablet Take 10 mg by mouth daily with breakfast.    . aspirin EC 81 MG tablet Take 81 mg by mouth daily.    Marland Kitchen azelastine (ASTELIN) 0.1 % nasal spray Place 1 spray into both nostrils 2 (two) times daily. Use in each nostril as directed (Patient taking differently: Place 1 spray into both nostrils 2 (two) times daily as needed for rhinitis (seasonal rhinitis). Use in each nostril as directed) 30 mL 11  . celecoxib (CELEBREX) 100 MG capsule Take 1 capsule (100 mg total) by mouth 2 (two) times daily as needed. 60 capsule 5  . diazepam (VALIUM) 10 MG tablet Take 1 tablet (10 mg total) by mouth every 8 (eight) hours as needed. (Patient taking  differently: Take 10 mg by mouth every 8 (eight) hours as needed (back spasm). ) 30 tablet 0  . fish oil-omega-3 fatty acids 1000 MG capsule Take 2 g by mouth daily.      . folic acid (FOLVITE) 1 MG tablet TAKE 1 TABLET BY MOUTH  DAILY 90 tablet 0  . HYDROcodone-acetaminophen (NORCO) 5-325 MG tablet Take 1-2 tablets by mouth every 4 (four) hours as needed for moderate pain. 60 tablet 0  . losartan-hydrochlorothiazide (HYZAAR) 50-12.5 MG tablet TAKE 1 TABLET BY MOUTH  DAILY 90 tablet 1  . methocarbamol (ROBAXIN) 500 MG tablet Take 1 tablet (500 mg total) by mouth every 6 (six) hours as needed for muscle spasms. 60 tablet 0  . Multiple Vitamin (MULTIVITAMIN WITH MINERALS) TABS tablet Take 1 tablet by mouth daily.    . mupirocin ointment (BACTROBAN) 2 %     . RABEprazole (ACIPHEX) 20 MG tablet TAKE 1 TABLET BY MOUTH  DAILY 90 tablet 0  . rosuvastatin (CRESTOR) 10 MG tablet Take 1 tablet by mouth  every day 30 tablet 2  . traMADol (ULTRAM) 50 MG tablet Take 1 tablet (50 mg total) by mouth every 8 (eight) hours as needed. 30 tablet 2   No current facility-administered medications for this visit.     Objective: BP 126/88 (BP Location: Left Arm, Patient Position: Sitting, Cuff Size: Large)   Pulse 76  Temp 97.6 F (36.4 C) (Oral)   Ht 6' 1.5" (1.867 m)   Wt 274 lb 3.2 oz (124.4 kg)   SpO2 97%   BMI 35.69 kg/m  Gen: NAD, resting comfortably CV: RRR no murmurs rubs or gallops Lungs: CTAB no crackles, wheeze, rhonchi Abdomen: soft/nontender/nondistended/normal bowel sounds. obese MSK: see below Ext: 1+ nonpitting edema Skin: warm, dry, no rash  Assessment/Plan:  Lipoma of back S:prior had a lipoma remove din office but was not pleased with scar/cosmetic result. He has had a thought to be lipoma in left low back. He has not wanted to pursue treatment for this but states over the last month or so it has started to become tender whenever pressed upon and prior no pain.  O: at least 3 x 3 cm  area of tissue noted in left low back- no tsuperficial- still feels mbile underneath skin though.  A/P: Due to new pain in area- he would like to pursue surgical consult. Did well with recent hip replacement "stress" so would not need cardiac clearance if surgery needed- he also has been able to complete 4 METS without difficulty after recovering from hip replacmenet.    Essential hypertension S: controlled on Alfuzosin, losartan-hctz 50-12.5mg . .  BP Readings from Last 3 Encounters:  08/02/16 126/88  01/12/16 128/86  12/08/15 (!) 106/51  A/P:Continue current meds:  We discussed weight loss would be ideal to try to push diastolic down further   BPH (benign prostatic hyperplasia) S:Known BPH. Has had elevated PSA. Negative biopsy 2016. Following with Dr. Diona Fanti A/P: plan is for 6 month repeat PSA with [place]- he will come back for psa and free psa then see Dr. Diona Fanti for rectal exam and discussion of if repeat biopsy needed   Hyperglycemia S:Weight is a concern, as is family hx of DM. Likely time for a Hb A1C to monitor. Diet is okay, but inconsistent (states eat well but may not eat 3 meals a day at regular intervals and may overeat). Does not exercise regularly.  Wt Readings from Last 3 Encounters:  08/02/16 274 lb 3.2 oz (124.4 kg)  01/12/16 258 lb 3.2 oz (117.1 kg)  12/04/15 259 lb (117.5 kg)   Lab Results  Component Value Date   HGBA1C 5.7 10/05/2015    A/P: weight up 16 lbs. Update a1c. Encouraged need for healthy eating, regular exercise, weight loss.    Return in about 6 months (around 01/30/2017) for physical.  Not CPE labs Biometric screening will be forthcoming from the [place], but unsure of date.  Orders Placed This Encounter  Procedures  . Tdap vaccine greater than or equal to 7yo IM  . PSA, Total and Free    Standing Status:   Future    Standing Expiration Date:   08/02/2017  . Hemoglobin A1c    [place]    Standing Status:   Future    Standing Expiration  Date:   08/02/2017  . CBC    Standing Status:   Future    Standing Expiration Date:   08/02/2017  . Comprehensive metabolic panel    [place]    Standing Status:   Future    Standing Expiration Date:   08/02/2017  . Lipid panel    Standing Status:   Future    Standing Expiration Date:   08/02/2017  . Ambulatory referral to General Surgery    Referral Priority:   Routine    Referral Type:   Surgical    Referral Reason:  Specialty Services Required    Requested Specialty:   General Surgery    Number of Visits Requested:   1  . POCT Urinalysis Dipstick (Automated)    Standing Status:   Future    Standing Expiration Date:   08/02/2017   Return precautions advised.  Garret Reddish, MD

## 2016-08-02 NOTE — Assessment & Plan Note (Addendum)
S:prior had a lipoma remove din office but was not pleased with scar/cosmetic result. He has had a thought to be lipoma in left low back. He has not wanted to pursue treatment for this but states over the last month or so it has started to become tender whenever pressed upon and prior no pain.  O: at least 3 x 3 cm area of tissue noted in left low back- no tsuperficial- still feels mbile underneath skin though.  A/P: Due to new pain in area- he would like to pursue surgical consult. Did well with recent hip replacement "stress" so would not need cardiac clearance if surgery needed- he also has been able to complete 4 METS without difficulty after recovering from hip replacmenet.

## 2016-08-02 NOTE — Patient Instructions (Addendum)
Schedule a lab visit at the check out desk whenever it lines up with your biometric screen for work. Return for future fasting labs meaning nothing but water after midnight please. Ok to take your medications with water.   We will call you within a week or two about your referral to general surgery. If you do not hear within 3 weeks, give [place] a call.

## 2016-08-16 DIAGNOSIS — D171 Benign lipomatous neoplasm of skin and subcutaneous tissue of trunk: Secondary | ICD-10-CM | POA: Diagnosis not present

## 2016-08-24 ENCOUNTER — Encounter: Payer: Self-pay | Admitting: Family Medicine

## 2016-08-30 ENCOUNTER — Encounter: Payer: Self-pay | Admitting: Family Medicine

## 2016-08-30 ENCOUNTER — Other Ambulatory Visit (INDEPENDENT_AMBULATORY_CARE_PROVIDER_SITE_OTHER): Payer: 59

## 2016-08-30 DIAGNOSIS — R972 Elevated prostate specific antigen [PSA]: Secondary | ICD-10-CM | POA: Diagnosis not present

## 2016-08-30 DIAGNOSIS — E785 Hyperlipidemia, unspecified: Secondary | ICD-10-CM

## 2016-08-30 DIAGNOSIS — R739 Hyperglycemia, unspecified: Secondary | ICD-10-CM | POA: Diagnosis not present

## 2016-08-30 DIAGNOSIS — I10 Essential (primary) hypertension: Secondary | ICD-10-CM

## 2016-08-30 LAB — LIPID PANEL
CHOLESTEROL: 96 mg/dL (ref 0–200)
HDL: 30.4 mg/dL — AB (ref >39.00)
LDL CALC: 55 mg/dL (ref 0–99)
NonHDL: 66.09
TRIGLYCERIDES: 56 mg/dL (ref 0.0–149.0)
Total CHOL/HDL Ratio: 3
VLDL: 11.2 mg/dL (ref 0.0–40.0)

## 2016-08-30 LAB — POC URINALSYSI DIPSTICK (AUTOMATED)
BILIRUBIN UA: NEGATIVE
Blood, UA: NEGATIVE
GLUCOSE UA: NEGATIVE
KETONES UA: NEGATIVE
LEUKOCYTES UA: NEGATIVE
Nitrite, UA: NEGATIVE
PH UA: 6 (ref 5.0–8.0)
Protein, UA: NEGATIVE
Spec Grav, UA: 1.03 (ref 1.030–1.035)
Urobilinogen, UA: 0.2 (ref ?–2.0)

## 2016-08-30 LAB — COMPREHENSIVE METABOLIC PANEL
ALBUMIN: 4.6 g/dL (ref 3.5–5.2)
ALK PHOS: 76 U/L (ref 39–117)
ALT: 25 U/L (ref 0–53)
AST: 17 U/L (ref 0–37)
BUN: 20 mg/dL (ref 6–23)
CALCIUM: 9.4 mg/dL (ref 8.4–10.5)
CHLORIDE: 107 meq/L (ref 96–112)
CO2: 31 mEq/L (ref 19–32)
Creatinine, Ser: 0.87 mg/dL (ref 0.40–1.50)
GFR: 98.1 mL/min (ref >60.00)
Glucose, Bld: 98 mg/dL (ref 70–99)
POTASSIUM: 4.6 meq/L (ref 3.5–5.1)
SODIUM: 144 meq/L (ref 135–145)
Total Bilirubin: 0.6 mg/dL (ref 0.2–1.2)
Total Protein: 6.5 g/dL (ref 6.0–8.3)

## 2016-08-30 LAB — CBC
HEMATOCRIT: 43.2 % (ref 39.0–52.0)
HEMOGLOBIN: 14.7 g/dL (ref 13.0–17.0)
MCHC: 33.9 g/dL (ref 30.0–36.0)
MCV: 85.7 fl (ref 78.0–100.0)
PLATELETS: 222 10*3/uL (ref 150.0–400.0)
RBC: 5.05 Mil/uL (ref 4.22–5.81)
RDW: 13.7 % (ref 11.5–15.5)
WBC: 4.7 10*3/uL (ref 4.0–10.5)

## 2016-08-30 LAB — HEMOGLOBIN A1C: Hgb A1c MFr Bld: 5.8 % (ref 4.6–6.5)

## 2016-08-31 LAB — PSA, TOTAL AND FREE
PSA, % Free: 20 % — ABNORMAL LOW (ref >25)
PSA, Free: 1.9 ng/mL
PSA, TOTAL: 9.6 ng/mL — AB (ref <=4.0)

## 2016-09-08 ENCOUNTER — Encounter: Payer: Self-pay | Admitting: Family Medicine

## 2016-09-08 NOTE — Telephone Encounter (Signed)
I called the pt and asked him about the form as I could not find this on Jamie's desk.  Patient stated this form was submitted a few weeks ago and I looked in media and found a blank form.  Form was printed, areas completed in regards to visit and labs and sent to Dr Yong Channel to sign.  I informed the pt this will be sent to [facility].

## 2016-09-26 DIAGNOSIS — D171 Benign lipomatous neoplasm of skin and subcutaneous tissue of trunk: Secondary | ICD-10-CM | POA: Diagnosis not present

## 2016-10-22 ENCOUNTER — Other Ambulatory Visit: Payer: Self-pay | Admitting: Family Medicine

## 2017-01-19 DIAGNOSIS — D1801 Hemangioma of skin and subcutaneous tissue: Secondary | ICD-10-CM | POA: Diagnosis not present

## 2017-01-19 DIAGNOSIS — L0102 Bockhart's impetigo: Secondary | ICD-10-CM | POA: Diagnosis not present

## 2017-01-19 DIAGNOSIS — D485 Neoplasm of uncertain behavior of skin: Secondary | ICD-10-CM | POA: Diagnosis not present

## 2017-01-19 DIAGNOSIS — Z85820 Personal history of malignant melanoma of skin: Secondary | ICD-10-CM | POA: Diagnosis not present

## 2017-01-19 DIAGNOSIS — L821 Other seborrheic keratosis: Secondary | ICD-10-CM | POA: Diagnosis not present

## 2017-01-19 DIAGNOSIS — D2271 Melanocytic nevi of right lower limb, including hip: Secondary | ICD-10-CM | POA: Diagnosis not present

## 2017-01-20 ENCOUNTER — Other Ambulatory Visit: Payer: Self-pay

## 2017-01-20 DIAGNOSIS — E785 Hyperlipidemia, unspecified: Secondary | ICD-10-CM

## 2017-01-20 DIAGNOSIS — R739 Hyperglycemia, unspecified: Secondary | ICD-10-CM

## 2017-01-20 DIAGNOSIS — Z0000 Encounter for general adult medical examination without abnormal findings: Secondary | ICD-10-CM

## 2017-01-23 ENCOUNTER — Other Ambulatory Visit (INDEPENDENT_AMBULATORY_CARE_PROVIDER_SITE_OTHER): Payer: 59

## 2017-01-23 DIAGNOSIS — Z0000 Encounter for general adult medical examination without abnormal findings: Secondary | ICD-10-CM | POA: Diagnosis not present

## 2017-01-23 DIAGNOSIS — E785 Hyperlipidemia, unspecified: Secondary | ICD-10-CM

## 2017-01-23 DIAGNOSIS — R739 Hyperglycemia, unspecified: Secondary | ICD-10-CM

## 2017-01-23 LAB — CBC
HEMATOCRIT: 42.3 % (ref 39.0–52.0)
HEMOGLOBIN: 14.3 g/dL (ref 13.0–17.0)
MCHC: 33.9 g/dL (ref 30.0–36.0)
MCV: 86.4 fl (ref 78.0–100.0)
Platelets: 195 10*3/uL (ref 150.0–400.0)
RBC: 4.89 Mil/uL (ref 4.22–5.81)
RDW: 13.4 % (ref 11.5–15.5)
WBC: 4.9 10*3/uL (ref 4.0–10.5)

## 2017-01-23 LAB — COMPREHENSIVE METABOLIC PANEL
ALBUMIN: 4 g/dL (ref 3.5–5.2)
ALK PHOS: 79 U/L (ref 39–117)
ALT: 27 U/L (ref 0–53)
AST: 18 U/L (ref 0–37)
BUN: 21 mg/dL (ref 6–23)
CALCIUM: 9 mg/dL (ref 8.4–10.5)
CHLORIDE: 107 meq/L (ref 96–112)
CO2: 25 mEq/L (ref 19–32)
Creatinine, Ser: 0.86 mg/dL (ref 0.40–1.50)
GFR: 99.26 mL/min (ref >60.00)
Glucose, Bld: 98 mg/dL (ref 70–99)
POTASSIUM: 4.1 meq/L (ref 3.5–5.1)
Sodium: 141 mEq/L (ref 135–145)
TOTAL PROTEIN: 6.4 g/dL (ref 6.0–8.3)
Total Bilirubin: 0.8 mg/dL (ref 0.2–1.2)

## 2017-01-23 LAB — HEMOGLOBIN A1C: HEMOGLOBIN A1C: 5.6 % (ref 4.6–6.5)

## 2017-01-23 LAB — LDL CHOLESTEROL, DIRECT: LDL DIRECT: 53 mg/dL

## 2017-01-30 ENCOUNTER — Encounter: Payer: Self-pay | Admitting: Family Medicine

## 2017-01-30 ENCOUNTER — Ambulatory Visit (INDEPENDENT_AMBULATORY_CARE_PROVIDER_SITE_OTHER): Payer: 59 | Admitting: Family Medicine

## 2017-01-30 VITALS — BP 122/68 | HR 98 | Temp 97.9°F | Ht 73.75 in | Wt 275.0 lb

## 2017-01-30 DIAGNOSIS — N200 Calculus of kidney: Secondary | ICD-10-CM

## 2017-01-30 DIAGNOSIS — Z0000 Encounter for general adult medical examination without abnormal findings: Secondary | ICD-10-CM

## 2017-01-30 DIAGNOSIS — I10 Essential (primary) hypertension: Secondary | ICD-10-CM | POA: Diagnosis not present

## 2017-01-30 DIAGNOSIS — R739 Hyperglycemia, unspecified: Secondary | ICD-10-CM | POA: Diagnosis not present

## 2017-01-30 DIAGNOSIS — E785 Hyperlipidemia, unspecified: Secondary | ICD-10-CM

## 2017-01-30 MED ORDER — HYDROCODONE-ACETAMINOPHEN 5-325 MG PO TABS: 1.0000 | ORAL_TABLET | ORAL | 0 refills | Status: DC | PRN

## 2017-01-30 NOTE — Assessment & Plan Note (Signed)
Encouraged need for healthy eating, regular exercise, weight loss.  BMI >35 and HTN and HLD Advised 6 month follow up with at least 10-15 lb weight loss goal

## 2017-01-30 NOTE — Progress Notes (Addendum)
Phone: [phone]  Subjective:  Patient presents today for their annual physical. Chief complaint-noted.   See problem oriented charting- ROS- full  review of systems was completed and negative including No chest pain or shortness of breath. No headache or blurry vision.   The following were reviewed and entered/updated in epic: Past Medical History:  Diagnosis Date  . Arthritis   . BPH (benign prostatic hyperplasia)   . GERD (gastroesophageal reflux disease)   . Hyperlipidemia   . Hypertension   . Kidney stone   . Plantar fasciitis    cortisone injection   Patient Active Problem List   Diagnosis Date Noted  . Melanoma in situ ([place]) 08/02/2016    Priority: Medium  . Hyperglycemia 10/05/2015    Priority: Medium  . BPH (benign prostatic hyperplasia) 04/02/2014    Priority: Medium  . Family history of peripheral vascular disease 03/17/2011    Priority: Medium  . Lipoma of back 07/08/2009    Priority: Medium  . Hyperlipidemia 02/26/2007    Priority: Medium  . Essential hypertension 02/26/2007    Priority: Medium  . Osteoarthritis 02/26/2007    Priority: Medium  . Status post bilateral hip replacements 12/04/2015    Priority: Low  . Hemangioma of liver 04/02/2014    Priority: Low  . Nephrolithiasis 04/02/2014    Priority: Low  . GERD 06/18/2007    Priority: Low  . INSOMNIA, PERSISTENT 02/26/2007    Priority: Low  . Morbid obesity ([place]) 01/30/2017   Past Surgical History:  Procedure Laterality Date  . BILATERAL ANTERIOR TOTAL HIP ARTHROPLASTY Bilateral 12/04/2015   Procedure: BILATERAL ANTERIOR APPROACH TOTAL HIP ARTHROPLASTY;  Surgeon: Mcarthur Rossetti, MD;  Location: [facility];  Service: Orthopedics;  Laterality: Bilateral;  . left eye surgery     left eye surgery-squint surgery-age 54  . none    . TONSILLECTOMY     age 72    Family History  Problem Relation Age of Onset  . Lung cancer Father        former smoker  . Heart disease Father 28       PAD  (carotid endarterectomy at 44) and CAD with MI in late 22s early 29s.   . Heart disease Sister 59       mi, smoker  . Crohn's disease Daughter   . Stroke Paternal Grandfather 80  . Stroke Brother 4       physician who stopped practicing as result    Medications- reviewed and updated Current Outpatient Prescriptions  Medication Sig Dispense Refill  . acetaminophen (TYLENOL) 500 MG tablet Take 1,000 mg by mouth daily.    Marland Kitchen alfuzosin (UROXATRAL) 10 MG 24 hr tablet Take 10 mg by mouth daily with breakfast.    . aspirin EC 81 MG tablet Take 81 mg by mouth daily.    Marland Kitchen azelastine (ASTELIN) 0.1 % nasal spray Place 1 spray into both nostrils 2 (two) times daily. Use in each nostril as directed (Patient taking differently: Place 1 spray into both nostrils 2 (two) times daily as needed for rhinitis (seasonal rhinitis). Use in each nostril as directed) 30 mL 11  . fish oil-omega-3 fatty acids 1000 MG capsule Take 2 g by mouth daily.      . folic acid (FOLVITE) 1 MG tablet TAKE 1 TABLET BY MOUTH  DAILY 90 tablet 2  . losartan-hydrochlorothiazide (HYZAAR) 50-12.5 MG tablet TAKE 1 TABLET BY MOUTH  DAILY 90 tablet 2  . Multiple Vitamin (MULTIVITAMIN WITH MINERALS) TABS tablet  Take 1 tablet by mouth daily.    . RABEprazole (ACIPHEX) 20 MG tablet TAKE 1 TABLET BY MOUTH  DAILY 90 tablet 2  . rosuvastatin (CRESTOR) 10 MG tablet Take 1 tablet by mouth  every day 30 tablet 2  . HYDROcodone-acetaminophen (NORCO) 5-325 MG tablet Take 1-2 tablets by mouth every 4 (four) hours as needed for moderate pain. 4 doses total per day 8 tablet 0  . mupirocin ointment (BACTROBAN) 2 %      No current facility-administered medications for this visit.     Allergies-reviewed and updated No Known Allergies  Social History   Social History  . Marital status: Married    Spouse name: N/A  . Number of children: N/A  . Years of education: N/A   Social History Main Topics  . Smoking status: Never Smoker  . Smokeless  tobacco: Never Used  . Alcohol use Yes     Comment: 3 per month  . Drug use: No  . Sexual activity: Yes   Other Topics Concern  . None   Social History Narrative   Married 1990. 3 kids- 40-71 years old in 2015.    New dog in 2015-walking regularly. 3.5-4 hour walks on weekend.       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 Oropharynx normal Neck: no thyromegaly CV: RRR no murmurs rubs or gallops Lungs: CTAB no crackles, wheeze, rhonchi Abdomen: soft/nontender/nondistended/normal bowel sounds. No rebound or guarding. Morbid obesity Ext: trace edema Skin: warm, dry Neuro: grossly normal, moves all extremities, PERRLA  Assessment/Plan:  51 y.o. male presenting for annual physical.  Health Maintenance counseling: 1. Anticipatory guidance: Patient counseled regarding regular dental exams -q6 months, eye exams -Dr. Gershon Cr[facility]--> switching optho soon, wearing seatbelts.  2. Risk factor reduction:  Advised patient of need for regular exercise and diet rich and fruits and vegetables to reduce risk of heart attack and stroke. Exercise- improving- particularly on the weekends 10k steps on weekday. Diet-more grab and go/eating out- discussed packing foods- needs to eat out les.  Wt Readings from Last 3 Encounters:  01/30/17 275 lb (124.7 kg)  08/02/16 274 lb 3.2 oz (124.4 kg)  01/12/16 258 lb 3.2 oz (117.1 kg)  3. Immunizations/screenings/ancillary studies- gets flu shot through work each year . Discussed shingrix and lack of  availability- hope to give next year  Immunization History  Administered Date(s) Administered  . Influenza Whole 04/10/2009  . Influenza,inj,Quad PF,6+ Mos 03/29/2013  . Influenza-Unspecified 04/04/2014, 03/23/2015, 02/19/2016  . PPD Test 04/04/2014  . Td 04/06/2006  . Tdap 08/02/2016  4. Prostate cancer screening- follows with [facility] for this after elevated PSA. Thought to be due to BPH and had negative biopsy 2016 under Dr. Diona Fanti. September 24th follow up planned. Will get UA at urology  5. Colon cancer screening - 05/07/15 with 10 year follow up  6. Skin cancer screening-  sees Dr. Elvera Lennox on yearly basis with [place] [facility]. History oof  melanoma in situ. Dr. Sarajane Jews does surgeries.   Status of chronic or acute concerns  Lipoma- saw Dr. Marlou Starks and removed x2 about 3 months ago  HLD- well controlled on crestor 10mg   HTN-  On alfuzosin, losartan-HCT 50-12.5mg  and controlled  BP Readings from Last 3 Encounters:  01/30/17 122/68  08/02/16 126/88  01/12/16 128/86   BPH- see above. alfuzosin only. Will discuss with Dr. Shirley Muscat  Hyperglycemia- luckily a1c not elevated at 5.6 (5.7 previously) on most recent check. Continue to work on healthy eating/regular exercise. Weight had been up 16 lbs on last visit. See morbid obesity Wt Readings from Last 3 Encounters:  01/30/17 275 lb (124.7 kg)  08/02/16 274 lb 3.2 oz (124.4 kg)  01/12/16 258 lb 3.2 oz (117.1 kg)   OA hip- did well with bilateral replacements last year.   Morbid obesity ([place]) Encouraged need for healthy eating, regular exercise, weight loss.  BMI >35 and HTN and HLD Advised 6 month follow up with at least 10-15 lb weight loss goal  Nephrolithiasis Kidney stones- wants to have vicodin on hand to help avoid ER visit if needed- prior rx over a year old and will provide refill. Reviewed [facility].   6 month  Meds ordered this encounter  Medications  . HYDROcodone-acetaminophen (NORCO) 5-325 MG tablet    Sig: Take  1-2 tablets by mouth every 4 (four) hours as needed for moderate pain. 4 doses total per day    Dispense:  8 tablet    Refill:  0   Return precautions advised.  Garret Reddish, MD

## 2017-01-30 NOTE — Patient Instructions (Addendum)
No changes recommended today other than continued efforts on exercise and healthy eating. Probably the best tip would be having more planned foods for grab and go.   BMI >35 and HTN and HLD = morbid obesity Advised 6 month follow up with at least 10-15 lb weight loss goal  Please dispose of the old vicodin.

## 2017-01-30 NOTE — Assessment & Plan Note (Signed)
Kidney stones- wants to have vicodin on hand to help avoid ER visit if needed- prior rx over a year old and will provide refill. Reviewed [place].

## 2017-02-14 ENCOUNTER — Encounter: Payer: Self-pay | Admitting: Family Medicine

## 2017-02-23 DIAGNOSIS — R972 Elevated prostate specific antigen [PSA]: Secondary | ICD-10-CM | POA: Diagnosis not present

## 2017-02-23 LAB — PSA: PSA: 9.24

## 2017-03-12 ENCOUNTER — Other Ambulatory Visit: Payer: Self-pay | Admitting: Family Medicine

## 2017-03-26 ENCOUNTER — Encounter: Payer: Self-pay | Admitting: Family Medicine

## 2017-04-06 ENCOUNTER — Encounter: Payer: Self-pay | Admitting: Family Medicine

## 2017-04-07 ENCOUNTER — Telehealth: Payer: Self-pay | Admitting: Family Medicine

## 2017-04-07 NOTE — Telephone Encounter (Signed)
Called and lvm for patient to call back. I can explain how to get him set up with my chart proxy access . This will enable him to send email's to Dr. Yong Channel and it will be in the correct patient's chart. This form is located at [email or web address]

## 2017-04-17 DIAGNOSIS — R972 Elevated prostate specific antigen [PSA]: Secondary | ICD-10-CM | POA: Diagnosis not present

## 2017-04-17 DIAGNOSIS — N401 Enlarged prostate with lower urinary tract symptoms: Secondary | ICD-10-CM | POA: Diagnosis not present

## 2017-04-19 ENCOUNTER — Encounter: Payer: Self-pay | Admitting: Family Medicine

## 2017-06-27 DIAGNOSIS — D2262 Melanocytic nevi of left upper limb, including shoulder: Secondary | ICD-10-CM | POA: Diagnosis not present

## 2017-06-27 DIAGNOSIS — D485 Neoplasm of uncertain behavior of skin: Secondary | ICD-10-CM | POA: Diagnosis not present

## 2017-06-27 DIAGNOSIS — D225 Melanocytic nevi of trunk: Secondary | ICD-10-CM | POA: Diagnosis not present

## 2017-06-27 DIAGNOSIS — Z85820 Personal history of malignant melanoma of skin: Secondary | ICD-10-CM | POA: Diagnosis not present

## 2017-07-13 DIAGNOSIS — D225 Melanocytic nevi of trunk: Secondary | ICD-10-CM | POA: Diagnosis not present

## 2017-07-13 DIAGNOSIS — D485 Neoplasm of uncertain behavior of skin: Secondary | ICD-10-CM | POA: Diagnosis not present

## 2017-07-17 ENCOUNTER — Encounter: Payer: Self-pay | Admitting: Family Medicine

## 2017-07-17 ENCOUNTER — Ambulatory Visit: Payer: 59 | Admitting: Family Medicine

## 2017-07-17 VITALS — BP 116/60 | HR 79 | Temp 98.6°F | Ht 73.75 in | Wt 269.0 lb

## 2017-07-17 DIAGNOSIS — M545 Low back pain, unspecified: Secondary | ICD-10-CM

## 2017-07-17 MED ORDER — DICLOFENAC SODIUM 75 MG PO TBEC: 75.0000 mg | DELAYED_RELEASE_TABLET | Freq: Two times a day (BID) | ORAL | 0 refills | Status: DC

## 2017-07-17 MED ORDER — CYCLOBENZAPRINE HCL 10 MG PO TABS: 10.0000 mg | ORAL_TABLET | Freq: Three times a day (TID) | ORAL | 0 refills | Status: DC | PRN

## 2017-07-17 MED ORDER — KETOROLAC TROMETHAMINE 60 MG/2ML IM SOLN
60.0000 mg | Freq: Once | INTRAMUSCULAR | Status: AC
  Administered 2017-07-17: 60 mg via INTRAMUSCULAR

## 2017-07-17 NOTE — Progress Notes (Signed)
    Subjective:  Andrew Wilkins is a 52 y.o. male who presents today for same-day appointment with a chief complaint of back pain.   HPI:  Back Pain, New Issue Symptoms started yesterday.  Patient broke up his 2 dogs that were attacking a rabbit.  Immediately after this, he noticed low back pain.  He has tried taking Robaxin and ibuprofen which did not help significantly.  Pain is located in his bilateral lower back.  No midline pain.  Pain radiates to his flank.  Pain does not radiate into his legs.  No bowel or bladder incontinence.  No urinary retention.  Has tried heating pad which has not helped significantly.  No other obvious alleviating or aggravating factors.  ROS: Per HPI  PMH: He reports that  has never smoked. he has never used smokeless tobacco. He reports that he drinks alcohol. He reports that he does not use drugs.  Objective:  Physical Exam: BP 116/60 (BP Location: Left Arm, Patient Position: Sitting, Cuff Size: Large)   Pulse 79   Temp 98.6 F (37 C) (Oral)   Ht 6' 1.75" (1.873 m)   Wt 269 lb (122 kg)   SpO2 95%   BMI 34.77 kg/m   Gen: NAD, resting comfortably MSK: -Back: No deformities.  Tender to palpation along lumbar paraspinal muscles bilaterally.  No midline tenderness. -Lower extremities: No deformities.  Strength 5 out of 5 throughout.  Sensation light touch intact throughout.  Straight leg raise negative bilaterally.  Assessment/Plan:  Low back pain No red flag signs or symptoms.  Will give 60 mg IM Toradol today.  Will give prescription for diclofenac to start tomorrow.  We will also give Flexeril for muscle spasm.  Encouraged patient to rest over the next few days and to avoid excess use of back.  Also advised use of heating pad to the area.  Return precautions reviewed.  Follow-up as needed.  Algis Greenhouse. Jerline Pain, MD 07/17/2017 11:01 AM

## 2017-07-17 NOTE — Patient Instructions (Addendum)
Please start the flexeril and diclofenac.  Take diclofenac 1 pill twice daily for 2 weeks.  Use a heating pad as needed.  Let me know if your symptoms are worsening or not improving over the next several days.   Take care, Dr Jerline Pain

## 2017-07-28 ENCOUNTER — Encounter: Payer: Self-pay | Admitting: Family Medicine

## 2017-07-28 ENCOUNTER — Ambulatory Visit: Payer: 59 | Admitting: Family Medicine

## 2017-07-28 VITALS — BP 130/76 | HR 78 | Temp 98.2°F | Ht 73.75 in | Wt 273.8 lb

## 2017-07-28 DIAGNOSIS — R3915 Urgency of urination: Secondary | ICD-10-CM | POA: Diagnosis not present

## 2017-07-28 DIAGNOSIS — E785 Hyperlipidemia, unspecified: Secondary | ICD-10-CM

## 2017-07-28 DIAGNOSIS — R739 Hyperglycemia, unspecified: Secondary | ICD-10-CM

## 2017-07-28 DIAGNOSIS — I10 Essential (primary) hypertension: Secondary | ICD-10-CM | POA: Diagnosis not present

## 2017-07-28 NOTE — Patient Instructions (Signed)
No changes   Come fasting for next visit in 6 months- physical and we will update labs

## 2017-07-28 NOTE — Progress Notes (Signed)
Subjective:  Andrew Wilkins is a 52 y.o. year old very pleasant male patient who presents for/with See problem oriented charting ROS- som urinary urgency and hesitancy at times (could be from BPH), stable edema. No chest pain or shortness of breath. No headache or blurry vision.    Past Medical History-  Patient Active Problem List   Diagnosis Date Noted  . Melanoma in situ ([place]) 08/02/2016    Priority: Medium  . Hyperglycemia 10/05/2015    Priority: Medium  . BPH (benign prostatic hyperplasia) 04/02/2014    Priority: Medium  . Family history of peripheral vascular disease 03/17/2011    Priority: Medium  . Lipoma of back 07/08/2009    Priority: Medium  . Hyperlipidemia 02/26/2007    Priority: Medium  . Essential hypertension 02/26/2007    Priority: Medium  . Osteoarthritis 02/26/2007    Priority: Medium  . Status post bilateral hip replacements 12/04/2015    Priority: Low  . Hemangioma of liver 04/02/2014    Priority: Low  . Nephrolithiasis 04/02/2014    Priority: Low  . GERD 06/18/2007    Priority: Low  . INSOMNIA, PERSISTENT 02/26/2007    Priority: Low  . Morbid obesity ([place]) 01/30/2017    Medications- reviewed and updated Current Outpatient Medications  Medication Sig Dispense Refill  . acetaminophen (TYLENOL) 500 MG tablet Take 1,000 mg by mouth daily.    Marland Kitchen alfuzosin (UROXATRAL) 10 MG 24 hr tablet Take 10 mg by mouth daily with breakfast.    . aspirin EC 81 MG tablet Take 81 mg by mouth daily.    Marland Kitchen azelastine (ASTELIN) 0.1 % nasal spray USE 1 SPRAY INTO BOTH NOSTRILS TWICE DAILY. 30 mL 0  . cyclobenzaprine (FLEXERIL) 10 MG tablet Take 1 tablet (10 mg total) by mouth 3 (three) times daily as needed for muscle spasms. 30 tablet 0  . diclofenac (VOLTAREN) 75 MG EC tablet Take 1 tablet (75 mg total) by mouth 2 (two) times daily. 30 tablet 0  . fish oil-omega-3 fatty acids 1000 MG capsule Take 2 g by mouth daily.      . folic acid (FOLVITE) 1 MG tablet TAKE 1 TABLET BY  MOUTH  DAILY 90 tablet 2  . HYDROcodone-acetaminophen (NORCO) 5-325 MG tablet Take 1-2 tablets by mouth every 4 (four) hours as needed for moderate pain. 4 doses total per day 8 tablet 0  . losartan-hydrochlorothiazide (HYZAAR) 50-12.5 MG tablet TAKE 1 TABLET BY MOUTH  DAILY 90 tablet 2  . Multiple Vitamin (MULTIVITAMIN WITH MINERALS) TABS tablet Take 1 tablet by mouth daily.    . mupirocin ointment (BACTROBAN) 2 %     . RABEprazole (ACIPHEX) 20 MG tablet TAKE 1 TABLET BY MOUTH  DAILY 90 tablet 2  . rosuvastatin (CRESTOR) 10 MG tablet Take 1 tablet by mouth  every day 30 tablet 2   No current facility-administered medications for this visit.     Objective: BP 130/76 (BP Location: Left Arm, Patient Position: Sitting, Cuff Size: Large)   Pulse 78   Temp 98.2 F (36.8 C) (Oral)   Ht 6' 1.75" (1.873 m)   Wt 273 lb 12.8 oz (124.2 kg)   SpO2 96%   BMI 35.39 kg/m  Gen: NAD, resting comfortably CV: RRR no murmurs rubs or gallops Lungs: CTAB no crackles, wheeze, rhonchi Abdomen: soft/nontender/nondistended/normal bowel sounds. No rebound or guarding. obese Ext: trace edema Skin: warm, dry  Assessment/Plan:  Other issues 1. Flare up of back pain seen about a week ago- given  toradol shot and round of voltaren (he did about 5 days) per Dr. Jerline Pain along with flexxeril. Had been years since more severe issues liek this- and had done better since hip surgery as well. Had to break up his dogs.   Hyperlipidemia S: well controlled on crestor 10mg  with last LDL at 53. No myalgias.  A/P: continue current rx  Hypertension S: controlled on alfuzosin, losartan HCT 50-12.6mg  BP Readings from Last 3 Encounters:  07/28/17 130/76  07/17/17 116/60  01/30/17 122/68  A/P: We discussed blood pressure goal of <140/90. Continue current meds:  Obesity/hyperglycemia S: down 2 lbs from august but has had some fluctuations. Luckily last a1c was not elevated at 5.6 but has been up to 5.7 in past. Feels the  best he has felt in 15 years- has been able to be more active with hip surgery back in 2017. Usually has small things pop up that set him back.  Wt Readings from Last 3 Encounters:  07/28/17 273 lb 12.8 oz (124.2 kg)  07/17/17 269 lb (122 kg)  01/30/17 275 lb (124.7 kg)  A/P:  Urine odor/urinary urgency S: has some urgency at times and then may have difficulty voiding. UAs are ok. Some urinary odor.  A/P: he asks about doing culture to be on safe side- will order today  Return in about 6 months (around 01/25/2018) for physical. Come fasting  Lab/Order associations: Urinary urgency - Plan: Urine Culture  Return precautions advised.  Garret Reddish, MD

## 2017-07-29 LAB — URINE CULTURE
MICRO NUMBER: 90236578
RESULT: NO GROWTH
SPECIMEN QUALITY: ADEQUATE

## 2017-08-12 ENCOUNTER — Other Ambulatory Visit: Payer: Self-pay | Admitting: Family Medicine

## 2017-09-18 DIAGNOSIS — H5010 Unspecified exotropia: Secondary | ICD-10-CM | POA: Diagnosis not present

## 2017-10-20 DIAGNOSIS — R972 Elevated prostate specific antigen [PSA]: Secondary | ICD-10-CM | POA: Diagnosis not present

## 2017-11-16 DIAGNOSIS — M722 Plantar fascial fibromatosis: Secondary | ICD-10-CM | POA: Diagnosis not present

## 2017-12-18 DIAGNOSIS — D225 Melanocytic nevi of trunk: Secondary | ICD-10-CM | POA: Diagnosis not present

## 2017-12-18 DIAGNOSIS — D1801 Hemangioma of skin and subcutaneous tissue: Secondary | ICD-10-CM | POA: Diagnosis not present

## 2017-12-18 DIAGNOSIS — D485 Neoplasm of uncertain behavior of skin: Secondary | ICD-10-CM | POA: Diagnosis not present

## 2017-12-18 DIAGNOSIS — D2272 Melanocytic nevi of left lower limb, including hip: Secondary | ICD-10-CM | POA: Diagnosis not present

## 2017-12-18 DIAGNOSIS — L821 Other seborrheic keratosis: Secondary | ICD-10-CM | POA: Diagnosis not present

## 2018-01-15 ENCOUNTER — Other Ambulatory Visit: Payer: Self-pay

## 2018-01-15 MED ORDER — ROSUVASTATIN CALCIUM 10 MG PO TABS: 10.0000 mg | ORAL_TABLET | Freq: Every day | ORAL | 3 refills | Status: DC

## 2018-02-01 ENCOUNTER — Ambulatory Visit (INDEPENDENT_AMBULATORY_CARE_PROVIDER_SITE_OTHER): Payer: 59 | Admitting: Family Medicine

## 2018-02-01 ENCOUNTER — Encounter: Payer: Self-pay | Admitting: Family Medicine

## 2018-02-01 VITALS — BP 116/70 | HR 58 | Temp 98.5°F | Ht 73.0 in | Wt 271.0 lb

## 2018-02-01 DIAGNOSIS — E785 Hyperlipidemia, unspecified: Secondary | ICD-10-CM

## 2018-02-01 DIAGNOSIS — R739 Hyperglycemia, unspecified: Secondary | ICD-10-CM | POA: Diagnosis not present

## 2018-02-01 DIAGNOSIS — I10 Essential (primary) hypertension: Secondary | ICD-10-CM

## 2018-02-01 DIAGNOSIS — N200 Calculus of kidney: Secondary | ICD-10-CM

## 2018-02-01 DIAGNOSIS — Z0000 Encounter for general adult medical examination without abnormal findings: Secondary | ICD-10-CM

## 2018-02-01 DIAGNOSIS — Z79899 Other long term (current) drug therapy: Secondary | ICD-10-CM | POA: Diagnosis not present

## 2018-02-01 LAB — COMPREHENSIVE METABOLIC PANEL
ALBUMIN: 4.4 g/dL (ref 3.5–5.2)
ALT: 26 U/L (ref 0–53)
AST: 19 U/L (ref 0–37)
Alkaline Phosphatase: 74 U/L (ref 39–117)
BILIRUBIN TOTAL: 0.7 mg/dL (ref 0.2–1.2)
BUN: 21 mg/dL (ref 6–23)
CHLORIDE: 106 meq/L (ref 96–112)
CO2: 30 meq/L (ref 19–32)
CREATININE: 1.14 mg/dL (ref 0.40–1.50)
Calcium: 9.5 mg/dL (ref 8.4–10.5)
GFR: 71.42 mL/min (ref >60.00)
Glucose, Bld: 93 mg/dL (ref 70–99)
Potassium: 4 mEq/L (ref 3.5–5.1)
SODIUM: 141 meq/L (ref 135–145)
Total Protein: 6.8 g/dL (ref 6.0–8.3)

## 2018-02-01 LAB — CBC WITH DIFFERENTIAL/PLATELET
BASOS ABS: 0 10*3/uL (ref 0.0–0.1)
Basophils Relative: 0.8 % (ref 0.0–3.0)
EOS ABS: 0.2 10*3/uL (ref 0.0–0.7)
Eosinophils Relative: 2.9 % (ref 0.0–5.0)
HEMATOCRIT: 42.8 % (ref 39.0–52.0)
HEMOGLOBIN: 14.7 g/dL (ref 13.0–17.0)
LYMPHS PCT: 26.3 % (ref 12.0–46.0)
Lymphs Abs: 1.4 10*3/uL (ref 0.7–4.0)
MCHC: 34.3 g/dL (ref 30.0–36.0)
MCV: 85.9 fl (ref 78.0–100.0)
MONO ABS: 0.4 10*3/uL (ref 0.1–1.0)
Monocytes Relative: 6.8 % (ref 3.0–12.0)
Neutro Abs: 3.3 10*3/uL (ref 1.4–7.7)
Neutrophils Relative %: 63.2 % (ref 43.0–77.0)
PLATELETS: 197 10*3/uL (ref 150.0–400.0)
RBC: 4.98 Mil/uL (ref 4.22–5.81)
RDW: 13.3 % (ref 11.5–15.5)
WBC: 5.2 10*3/uL (ref 4.0–10.5)

## 2018-02-01 LAB — LIPID PANEL
CHOLESTEROL: 107 mg/dL (ref 0–200)
HDL: 31.8 mg/dL — AB (ref >39.00)
LDL CALC: 52 mg/dL (ref 0–99)
NONHDL: 74.86
TRIGLYCERIDES: 114 mg/dL (ref 0.0–149.0)
Total CHOL/HDL Ratio: 3
VLDL: 22.8 mg/dL (ref 0.0–40.0)

## 2018-02-01 LAB — HEMOGLOBIN A1C: Hgb A1c MFr Bld: 5.7 % (ref 4.6–6.5)

## 2018-02-01 LAB — VITAMIN B12: VITAMIN B 12: 430 pg/mL (ref 211–911)

## 2018-02-01 NOTE — Assessment & Plan Note (Signed)
HTN-  On alfuzosin, losartan-HCT 50-12.5mg  and controlled . We discussed possible switch to telmisartan-hctz.

## 2018-02-01 NOTE — Assessment & Plan Note (Signed)
Kidney stones- wanted to have vicodin on hand to help avoid ER visit if needed- prior rx over a year old and will provided refill last CPE- He never filled this and luckily hasnt had issues. We will remove from med list

## 2018-02-01 NOTE — Patient Instructions (Signed)
Great job losing 4 lbs over last year! Keep going in the right direction!   Please stop by lab before you go  No changes today

## 2018-02-01 NOTE — Progress Notes (Signed)
Phone: [phone]  Subjective:  Patient presents today for their annual physical. Chief complaint-noted.   See problem oriented charting- ROS- full  review of systems was completed and negative except for: hearing loss stable, tinnitus, runny nose seasonal, reading glasses, urinary urgency  The following were reviewed and entered/updated in epic: Past Medical History:  Diagnosis Date  . Arthritis   . BPH (benign prostatic hyperplasia)   . GERD (gastroesophageal reflux disease)   . Hyperlipidemia   . Hypertension   . Kidney stone   . Plantar fasciitis    cortisone injection   Patient Active Problem List   Diagnosis Date Noted  . Melanoma in situ ([place]) 08/02/2016    Priority: Medium  . Hyperglycemia 10/05/2015    Priority: Medium  . BPH (benign prostatic hyperplasia) 04/02/2014    Priority: Medium  . Family history of peripheral vascular disease 03/17/2011    Priority: Medium  . Lipoma of back 07/08/2009    Priority: Medium  . Hyperlipidemia 02/26/2007    Priority: Medium  . Essential hypertension 02/26/2007    Priority: Medium  . Osteoarthritis 02/26/2007    Priority: Medium  . Morbid obesity ([place]) 01/30/2017    Priority: Low  . Status post bilateral hip replacements 12/04/2015    Priority: Low  . Hemangioma of liver 04/02/2014    Priority: Low  . Nephrolithiasis 04/02/2014    Priority: Low  . GERD 06/18/2007    Priority: Low  . INSOMNIA, PERSISTENT 02/26/2007    Priority: Low   Past Surgical History:  Procedure Laterality Date  . BILATERAL ANTERIOR TOTAL HIP ARTHROPLASTY Bilateral 12/04/2015   Procedure: BILATERAL ANTERIOR APPROACH TOTAL HIP ARTHROPLASTY;  Surgeon: Mcarthur Rossetti, MD;  Location: [facility];  Service: Orthopedics;  Laterality: Bilateral;  . left eye surgery     left eye surgery-squint surgery-age 44  . none    . TONSILLECTOMY     age 72    Family History  Problem Relation Age of Onset  . Lung cancer Father        former smoker  .  Heart disease Father 58       PAD (carotid endarterectomy at 70) and CAD with MI in late 17s early 45s.   . Heart disease Sister 30       mi, smoker  . Crohn's disease Daughter   . Stroke Paternal Grandfather 27  . Stroke Brother 61       physician who stopped practicing as result    Medications- reviewed and updated Current Outpatient Medications  Medication Sig Dispense Refill  . acetaminophen (TYLENOL) 500 MG tablet Take 1,000 mg by mouth daily as needed.     Marland Kitchen alfuzosin (UROXATRAL) 10 MG 24 hr tablet Take 10 mg by mouth daily with breakfast.    . aspirin EC 81 MG tablet Take 81 mg by mouth daily.    Marland Kitchen azelastine (ASTELIN) 0.1 % nasal spray USE 1 SPRAY INTO BOTH NOSTRILS TWICE DAILY. 30 mL 0  . cyclobenzaprine (FLEXERIL) 10 MG tablet Take 1 tablet (10 mg total) by mouth 3 (three) times daily as needed for muscle spasms. 30 tablet 0  . diclofenac (VOLTAREN) 75 MG EC tablet Take 1 tablet (75 mg total) by mouth 2 (two) times daily. 30 tablet 0  . fish oil-omega-3 fatty acids 1000 MG capsule Take 2 g by mouth daily.      . folic acid (FOLVITE) 1 MG tablet TAKE 1 TABLET BY MOUTH  DAILY 90 tablet 2  .  losartan-hydrochlorothiazide (HYZAAR) 50-12.5 MG tablet TAKE 1 TABLET BY MOUTH  DAILY 90 tablet 2  . Multiple Vitamin (MULTIVITAMIN WITH MINERALS) TABS tablet Take 1 tablet by mouth daily.    . RABEprazole (ACIPHEX) 20 MG tablet TAKE 1 TABLET BY MOUTH  DAILY 90 tablet 2  . rosuvastatin (CRESTOR) 10 MG tablet Take 1 tablet (10 mg total) by mouth daily. 90 tablet 3   No current facility-administered medications for this visit.     Allergies-reviewed and updated No Known Allergies  Social History   Social History Narrative   Married 1990. 3 kids- 13-92 years old in 2015.    New dog in 2015-walking regularly. 3.5-4 hour walks on weekend.       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 Oropharynx normal Neck: no thyromegaly CV: RRR no murmurs rubs or gallops Lungs: CTAB no crackles, wheeze, rhonchi Abdomen: soft/nontender/nondistended/normal bowel sounds. No rebound or guarding. obese  Ext: trace edema Skin: warm, dry Neuro: grossly normal, moves all extremities, PERRLA  Assessment/Plan:  52 y.o. male presenting for annual physical.  Health Maintenance counseling: 1. Anticipatory guidance: Patient counseled regarding regular dental exams -q6 months, eye exams -yearly now with [place] [facility] Dr. Valetta Close, wearing seatbelts.  2. Risk factor reduction:  Advised patient of need for regular exercise and diet rich and fruits and vegetables to reduce risk of heart attack and stroke. Exercise- 10k steps on weekends- 6k during the week . Diet-weight moving in right direction from last year CPE down 4 lbs - has tried to tweak diet- wife has lost weight and that is helping.  Wt Readings from Last 3 Encounters:  02/01/18 271 lb (122.9 kg)  07/28/17 273 lb 12.8 oz (124.2 kg)  07/17/17 269 lb (122 kg)  3. Immunizations/screenings/ancillary studies- will get flu shot at work and let us know. shingirx- opts for weight list  Immunization History  Administered Date(s) Administered  . Influenza Whole 04/10/2009  . Influenza,inj,Quad PF,6+ Mos 03/29/2013  . Influenza-Unspecified 04/04/2014, 03/23/2015, 02/19/2016, 02/14/2017  . PPD Test 04/04/2014  . Td 04/06/2006  . Tdap 08/02/2016  4. Prostate cancer screening- follows with [facility] for thought to be BPH.   Lab Results  Component Value Date   PSA 9.24 02/23/2017   PSA 10.03  (H) 12/31/2015   PSA 8.65 08/10/2015   5. Colon cancer screening - 05/07/15 with 10 year follow up  6. Skin cancer screening- Sees Dr. Elvera Lennox on q6 month basis with [place] [facility]- history melanoma in situ. Dr Sarajane Jews for surgeries. advised regular sunscreen use. Denies worrisome, changing, or new skin lesions.   Status of chronic or acute concerns   BPH- see above. alfuzosin only. follows with Dr. Shirley Muscat  Hyperglycemia- luckily a1c not elevated at 5.6 (5.7 previously) on most recent check. Continue to work on healthy eating/regular exercise. Down 4 lbs over last year. Not fasting so we will get a1c  OA hip- did well with bilateral replacements last  year. Hips continue to feel great after replacement- activity level best in 4-5 years  GERD- aciphex controlling. On oral b12  Some bleeding with hemorrhoids. Up to date on colonoscopy  Insomnia with sleep maintenance- Wakes up around 3 30 AM- gets up and goes to work by 5. Getting 6.5 hrs- feels rested.   Hyperlipidemia HLD- well controlled on crestor 10mg  in past- update today. Strong family history CV disease- will leave on aspirin though we discussed if ever had GI issues would stop.   Essential hypertension HTN-  On alfuzosin, losartan-HCT 50-12.5mg  and controlled . We discussed possible switch to telmisartan-hctz.  Morbid obesity ([place]) Encouraged need for healthy eating, regular exercise, weight loss.  BMI >35 and HTN and HLD Thrilled down 4 lbs over year  Nephrolithiasis Kidney stones- wanted to have vicodin on hand to help avoid ER visit if needed- prior rx over a year old and will provided refill last CPE- He never filled this and luckily hasnt had issues. We will remove from med list  Return in about 6 months (around 08/04/2018) for follow up- or sooner if needed.  Lab/Order associations: Preventative health care - Plan: CBC with Differential/Platelet, Comprehensive metabolic panel, Hemoglobin A1c, Lipid panel, Vitamin  B12  Hyperlipidemia, unspecified hyperlipidemia type - Plan: CBC with Differential/Platelet, Comprehensive metabolic panel, Lipid panel  Essential hypertension  Hyperglycemia - Plan: Hemoglobin A1c  Morbid obesity ([place])  Nephrolithiasis  High risk medication use - Plan: Vitamin B12  Return precautions advised.  Garret Reddish, MD

## 2018-02-01 NOTE — Assessment & Plan Note (Signed)
HLD- well controlled on crestor 10mg  in past- update today. Strong family history CV disease- will leave on aspirin though we discussed if ever had GI issues would stop.

## 2018-02-01 NOTE — Assessment & Plan Note (Signed)
Encouraged need for healthy eating, regular exercise, weight loss.  BMI >35 and HTN and HLD Thrilled down 4 lbs over year

## 2018-02-09 ENCOUNTER — Telehealth: Payer: Self-pay | Admitting: Surgical

## 2018-02-09 NOTE — Telephone Encounter (Signed)
Left message for patient to call back to schedule Shingrix injection from wait list.

## 2018-02-16 ENCOUNTER — Encounter: Payer: Self-pay | Admitting: Family Medicine

## 2018-03-17 ENCOUNTER — Other Ambulatory Visit: Payer: Self-pay | Admitting: Family Medicine

## 2018-03-22 ENCOUNTER — Telehealth: Payer: Self-pay | Admitting: Family Medicine

## 2018-03-22 MED ORDER — LOSARTAN POTASSIUM-HCTZ 50-12.5 MG PO TABS: 1.0000 | ORAL_TABLET | Freq: Every day | ORAL | 0 refills | Status: DC

## 2018-03-22 NOTE — Telephone Encounter (Signed)
See note

## 2018-03-22 NOTE — Telephone Encounter (Signed)
LVM for pt to call back in regards to sending 30 day supply to local pharmacy rather than changing medication.

## 2018-03-22 NOTE — Telephone Encounter (Signed)
Spoke to pt and sent 90 day supply to [facility].

## 2018-03-22 NOTE — Telephone Encounter (Signed)
Copied from [place] [phone]. Topic: General - Other >> Mar 22, 2018  9:29 AM Keene Breath wrote: Reason for CRM: Matt with [facility] called to inform the doctor that the patients script for losartan-hydrochlorothiazide (HYZAAR) 50-12.5 MG tablet is out of stock at the moment.  The patient would like to take an alternative medication.  The pharmacy said the only alternative that is available is the generic, Valsartan with hydrochlorothiazide.  Please advise.  CB# [phone].

## 2018-05-18 DIAGNOSIS — R350 Frequency of micturition: Secondary | ICD-10-CM | POA: Diagnosis not present

## 2018-05-18 DIAGNOSIS — N401 Enlarged prostate with lower urinary tract symptoms: Secondary | ICD-10-CM | POA: Diagnosis not present

## 2018-05-18 LAB — PSA: PSA: 8.31

## 2018-05-23 DIAGNOSIS — N401 Enlarged prostate with lower urinary tract symptoms: Secondary | ICD-10-CM | POA: Diagnosis not present

## 2018-05-23 DIAGNOSIS — R972 Elevated prostate specific antigen [PSA]: Secondary | ICD-10-CM | POA: Diagnosis not present

## 2018-05-23 DIAGNOSIS — R351 Nocturia: Secondary | ICD-10-CM | POA: Diagnosis not present

## 2018-05-24 ENCOUNTER — Encounter: Payer: Self-pay | Admitting: Family Medicine

## 2018-06-25 DIAGNOSIS — D225 Melanocytic nevi of trunk: Secondary | ICD-10-CM | POA: Diagnosis not present

## 2018-06-25 DIAGNOSIS — Z85820 Personal history of malignant melanoma of skin: Secondary | ICD-10-CM | POA: Diagnosis not present

## 2018-06-25 DIAGNOSIS — L570 Actinic keratosis: Secondary | ICD-10-CM | POA: Diagnosis not present

## 2018-06-25 DIAGNOSIS — L821 Other seborrheic keratosis: Secondary | ICD-10-CM | POA: Diagnosis not present

## 2018-06-27 ENCOUNTER — Other Ambulatory Visit: Payer: Self-pay | Admitting: Family Medicine

## 2018-07-17 DIAGNOSIS — M25572 Pain in left ankle and joints of left foot: Secondary | ICD-10-CM | POA: Diagnosis not present

## 2018-07-19 DIAGNOSIS — M25572 Pain in left ankle and joints of left foot: Secondary | ICD-10-CM | POA: Diagnosis not present

## 2018-07-19 DIAGNOSIS — R262 Difficulty in walking, not elsewhere classified: Secondary | ICD-10-CM | POA: Diagnosis not present

## 2018-07-19 DIAGNOSIS — M76822 Posterior tibial tendinitis, left leg: Secondary | ICD-10-CM | POA: Diagnosis not present

## 2018-07-25 ENCOUNTER — Other Ambulatory Visit: Payer: Self-pay | Admitting: Family Medicine

## 2018-07-25 DIAGNOSIS — R262 Difficulty in walking, not elsewhere classified: Secondary | ICD-10-CM | POA: Diagnosis not present

## 2018-07-25 DIAGNOSIS — M25572 Pain in left ankle and joints of left foot: Secondary | ICD-10-CM | POA: Diagnosis not present

## 2018-07-25 DIAGNOSIS — M76822 Posterior tibial tendinitis, left leg: Secondary | ICD-10-CM | POA: Diagnosis not present

## 2018-07-30 DIAGNOSIS — M76822 Posterior tibial tendinitis, left leg: Secondary | ICD-10-CM | POA: Diagnosis not present

## 2018-07-30 DIAGNOSIS — M25572 Pain in left ankle and joints of left foot: Secondary | ICD-10-CM | POA: Diagnosis not present

## 2018-07-30 DIAGNOSIS — R262 Difficulty in walking, not elsewhere classified: Secondary | ICD-10-CM | POA: Diagnosis not present

## 2018-08-06 ENCOUNTER — Encounter: Payer: Self-pay | Admitting: Family Medicine

## 2018-08-06 ENCOUNTER — Ambulatory Visit: Payer: 59 | Admitting: Family Medicine

## 2018-08-06 VITALS — BP 108/62 | HR 82 | Temp 97.6°F | Ht 73.0 in | Wt 275.8 lb

## 2018-08-06 DIAGNOSIS — R739 Hyperglycemia, unspecified: Secondary | ICD-10-CM | POA: Diagnosis not present

## 2018-08-06 DIAGNOSIS — E785 Hyperlipidemia, unspecified: Secondary | ICD-10-CM | POA: Diagnosis not present

## 2018-08-06 DIAGNOSIS — K219 Gastro-esophageal reflux disease without esophagitis: Secondary | ICD-10-CM | POA: Diagnosis not present

## 2018-08-06 DIAGNOSIS — I10 Essential (primary) hypertension: Secondary | ICD-10-CM | POA: Diagnosis not present

## 2018-08-06 LAB — POCT GLYCOSYLATED HEMOGLOBIN (HGB A1C): HEMOGLOBIN A1C: 5.4 % (ref 4.0–5.6)

## 2018-08-06 NOTE — Patient Instructions (Addendum)
August or September for physical. Full bloodwork at next visit  A1c stable at 5.4 (with adjustment for this lab probably 5.6 or 5.7)

## 2018-08-06 NOTE — Progress Notes (Signed)
Phone [phone]   Subjective:  Andrew Wilkins is a 53 y.o. year old very pleasant male patient who presents for/with See problem oriented charting ROS- No chest pain or shortness of breath. No headache or blurry vision (other than wearing readers). Some sparing rectal bleeding from hemorrhoids.    Past Medical History-  Patient Active Problem List   Diagnosis Date Noted  . Melanoma in situ ([place]) 08/02/2016    Priority: Medium  . Hyperglycemia 10/05/2015    Priority: Medium  . BPH (benign prostatic hyperplasia) 04/02/2014    Priority: Medium  . Family history of peripheral vascular disease 03/17/2011    Priority: Medium  . Lipoma of back 07/08/2009    Priority: Medium  . Hyperlipidemia 02/26/2007    Priority: Medium  . Essential hypertension 02/26/2007    Priority: Medium  . Osteoarthritis 02/26/2007    Priority: Medium  . Morbid obesity ([place]) 01/30/2017    Priority: Low  . Status post bilateral hip replacements 12/04/2015    Priority: Low  . Hemangioma of liver 04/02/2014    Priority: Low  . Nephrolithiasis 04/02/2014    Priority: Low  . GERD 06/18/2007    Priority: Low  . INSOMNIA, PERSISTENT 02/26/2007    Priority: Low    Medications- reviewed and updated Current Outpatient Medications  Medication Sig Dispense Refill  . acetaminophen (TYLENOL) 500 MG tablet Take 1,000 mg by mouth daily as needed.     Marland Kitchen alfuzosin (UROXATRAL) 10 MG 24 hr tablet Take 10 mg by mouth daily with breakfast.    . aspirin EC 81 MG tablet Take 81 mg by mouth daily.    Marland Kitchen azelastine (ASTELIN) 0.1 % nasal spray USE 1 SPRAY INTO BOTH NOSTRILS TWICE DAILY. 30 mL 0  . cyclobenzaprine (FLEXERIL) 10 MG tablet Take 1 tablet (10 mg total) by mouth 3 (three) times daily as needed for muscle spasms. 30 tablet 0  . diclofenac (VOLTAREN) 75 MG EC tablet Take 1 tablet (75 mg total) by mouth 2 (two) times daily. 30 tablet 0  . fish oil-omega-3 fatty acids 1000 MG capsule Take 2 g by mouth daily.        . folic acid (FOLVITE) 1 MG tablet TAKE 1 TABLET BY MOUTH  DAILY 90 tablet 2  . hydrochlorothiazide (MICROZIDE) 12.5 MG capsule TAKE ONE CAPSULE DAILY WITH LOSARTAN TIL COMBINATION PILL AVAILABLE. 90 capsule 0  . losartan (COZAAR) 50 MG tablet TAKE 1 TABLET DAILY WITH HCTZ TIL COMBINATION PILL AVAILABLE. 90 tablet 0  . losartan-hydrochlorothiazide (HYZAAR) 50-12.5 MG tablet Take 1 tablet by mouth daily. 90 tablet 0  . Multiple Vitamin (MULTIVITAMIN WITH MINERALS) TABS tablet Take 1 tablet by mouth daily.    . RABEprazole (ACIPHEX) 20 MG tablet TAKE 1 TABLET BY MOUTH  DAILY 90 tablet 2  . rosuvastatin (CRESTOR) 10 MG tablet Take 1 tablet (10 mg total) by mouth daily. 90 tablet 3   No current facility-administered medications for this visit.      Objective:  BP 108/62 (BP Location: Left Arm, Patient Position: Sitting, Cuff Size: Large)   Pulse 82   Temp 97.6 F (36.4 C) (Oral)   Wt 275 lb 12.8 oz (125.1 kg)   SpO2 94%   BMI 36.39 kg/m  Gen: NAD, resting comfortably CV: RRR no murmurs rubs or gallops Lungs: CTAB no crackles, wheeze, rhonchi Abdomen: soft/nontender Ext: trace edema Skin: warm, dry     Assessment and Plan   #Hyperlipidemia S:Compliant with Crestor 10 mg.  Strong family  history of cardiovascular disease.  We have opted to continue aspirin as result. A/P: Stable. Continue current medications.     #Essential hypertension S: Compliant with alfuzosin, losartan 50 mg, hydrochlorothiazide 12.5 mg (now back in combo) A/P: Stable. Continue current medications.     #Morbid obesity S: BMI over 35 with hypertension and hyperlipidemia.  Patient has really gained 4 pounds-was trending down last visit- this was caused by limited mobility with ankle injury  A/P: he is back moving again - has topped 15k steps twice recently. He is trying to work on his food choices- wife has improved diet which has helped both of them.  Trying to avoid eating late- has even cut out night time  teaching.   % BPH-follows with Dr. Diona Fanti. Saw within a month or two. PSA still 8.3 but plan to monitor only. Has had biopsies in the past.    #Nephrolithiasis- in past, Patient has a few hydrocodone on hand in case has acute flareup to keep him out of emergency room. He finds toradol helps more so just has decided to go to hospital if needed  #Hyperglycemia S: Intermittent mild A1c elevations in the low fives. A/P: update a1c today-   #GERD S: Compliant with AcipHex.  He is on oral B12. Reflux doing well A/P: Stable. Continue current medications.   # some early allergies this year- astelin and allerclear ([employer] brand antihistamine)- this has been helpful.     Other notes: 1.  Bilateral hip replacements 2018- doing well still   2.  Intermittent bleeding with hemorrhoids-up-to-date on colonoscopy- minimal since last visit  3.  Went ice skating for Christmas- seeing ortho and doing some OT- ankle seems to be bothering him- set him back for about 8 weeks before going in.   No problem-specific Assessment & Plan notes found for this encounter.   Future Appointments  Date Time Provider [place]  08/06/2018  8:20 AM Marin Olp, MD [facility]   No follow-ups on file.  Lab/Order associations: No diagnosis found.  No orders of the defined types were placed in this encounter.   Return precautions advised.  Garret Reddish, MD

## 2018-09-17 ENCOUNTER — Other Ambulatory Visit: Payer: Self-pay | Admitting: Family Medicine

## 2018-12-21 ENCOUNTER — Other Ambulatory Visit: Payer: Self-pay | Admitting: Family Medicine

## 2019-02-06 NOTE — Patient Instructions (Addendum)
Health Maintenance Due  Topic Date Due  . INFLUENZA VACCINE - will get at work and let us know 01/05/2019   Glad you are doing so well!   Please stop by lab before you go If you do not have mychart- we will call you about results within 5 business days of [place] receiving them.  If you have mychart- we will send your results within 3 business days of [place] receiving them.  If abnormal or we want to clarify a result, we will call or mychart you to make sure you receive the message.  If you have questions or concerns or don't hear within 5-7 days, please send [place] a message or call us.

## 2019-02-06 NOTE — Progress Notes (Signed)
Phone: [phone]   Subjective:  Patient presents today for their annual physical. Chief complaint-noted.   See problem oriented charting- ROS- full  review of systems was completed and negative except for: congestion, tinnitus, eye itching, difficulty urinating, urinary urgency, joint pain with activity, seasonal allergies, sleep issues. Most issues seasonal allergies  The following were reviewed and entered/updated in epic: Past Medical History:  Diagnosis Date  . Arthritis   . BPH (benign prostatic hyperplasia)   . GERD (gastroesophageal reflux disease)   . Hyperlipidemia   . Hypertension   . Kidney stone   . Plantar fasciitis    cortisone injection   Patient Active Problem List   Diagnosis Date Noted  . History of melanoma in situ 08/02/2016    Priority: Medium  . Hyperglycemia 10/05/2015    Priority: Medium  . BPH (benign prostatic hyperplasia) 04/02/2014    Priority: Medium  . Family history of peripheral vascular disease 03/17/2011    Priority: Medium  . Lipoma of back 07/08/2009    Priority: Medium  . Hyperlipidemia 02/26/2007    Priority: Medium  . Essential hypertension 02/26/2007    Priority: Medium  . Osteoarthritis 02/26/2007    Priority: Medium  . Status post bilateral hip replacements 12/04/2015    Priority: Low  . Hemangioma of liver 04/02/2014    Priority: Low  . Nephrolithiasis 04/02/2014    Priority: Low  . GERD 06/18/2007    Priority: Low  . INSOMNIA, PERSISTENT 02/26/2007    Priority: Low   Past Surgical History:  Procedure Laterality Date  . BILATERAL ANTERIOR TOTAL HIP ARTHROPLASTY Bilateral 12/04/2015   Procedure: BILATERAL ANTERIOR APPROACH TOTAL HIP ARTHROPLASTY;  Surgeon: Mcarthur Rossetti, MD;  Location: [facility];  Service: Orthopedics;  Laterality: Bilateral;  . left eye surgery     left eye surgery-squint surgery-age 25  . none    . TONSILLECTOMY     age 57    Family History  Problem Relation Age of Onset  . Lung cancer  Father        former smoker  . Heart disease Father 14       PAD (carotid endarterectomy at 22) and CAD with MI in late 87s early 60s.   . Heart disease Sister 60       mi, smoker  . Crohn's disease Daughter   . Stroke Paternal Grandfather 62  . Stroke Brother 57       physician who stopped practicing as result    Medications- reviewed and updated Current Outpatient Medications  Medication Sig Dispense Refill  . acetaminophen (TYLENOL) 500 MG tablet Take 1,000 mg by mouth daily as needed.     Marland Kitchen alfuzosin (UROXATRAL) 10 MG 24 hr tablet Take 10 mg by mouth daily with breakfast.    . aspirin EC 81 MG tablet Take 81 mg by mouth daily.    Marland Kitchen azelastine (ASTELIN) 0.1 % nasal spray USE 1 SPRAY INTO BOTH NOSTRILS TWICE DAILY. 30 mL 5  . cyclobenzaprine (FLEXERIL) 10 MG tablet Take 1 tablet (10 mg total) by mouth 3 (three) times daily as needed for muscle spasms. 30 tablet 0  . diclofenac (VOLTAREN) 75 MG EC tablet Take 1 tablet (75 mg total) by mouth 2 (two) times daily. 30 tablet 0  . fish oil-omega-3 fatty acids 1000 MG capsule Take 2 g by mouth daily.      . folic acid (FOLVITE) 1 MG tablet TAKE 1 TABLET BY MOUTH  DAILY 90 tablet 2  .  losartan-hydrochlorothiazide (HYZAAR) 50-12.5 MG tablet TAKE 1 TABLET BY MOUTH  DAILY 90 tablet 1  . meloxicam (MOBIC) 15 MG tablet     . Multiple Vitamin (MULTIVITAMIN WITH MINERALS) TABS tablet Take 1 tablet by mouth daily.    . RABEprazole (ACIPHEX) 20 MG tablet TAKE 1 TABLET BY MOUTH  DAILY 90 tablet 2  . rosuvastatin (CRESTOR) 10 MG tablet Take 1 tablet (10 mg total) by mouth daily. 90 tablet 3   No current facility-administered medications for this visit.     Allergies-reviewed and updated No Known Allergies  Social History   Social History Narrative   Married 1990. 3 kids- 80-26 years old in 2015.    New dog in 2015-walking regularly. 3.5-4 hour walks on weekend.       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 Oropharynx normal Neck: no thyromegaly or cervical lymphadenopathy CV: RRR no murmurs rubs or gallops Lungs: CTAB no crackles, wheeze, rhonchi Abdomen: soft/nontender/nondistended/normal bowel sounds. No rebound or guarding. obese Ext: no edema and 2+ PT pulses Skin: warm, dry Neuro: grossly normal, moves all extremities, PERRLA    Assessment and Plan  53 y.o. male presenting for annual physical.  Health Maintenance counseling: 1. Anticipatory guidance: Patient counseled regarding regular dental exams -q6 months, eye exams -yearly with Dr. Valetta Close,  avoiding smoking and second hand smoke , limiting alcohol to 2 beverages per day - 2 or less per month.   2. Risk factor reduction:  Advised patient of need for regular exercise and diet rich and fruits and vegetables to reduce risk of heart attack and stroke. Exercise-still getting 10K steps on the weekends and at least 6K during the week- relatively stable since last year (still bothers joints). Diet-wife has lost a lot of weight and he has seen some benefit- down 5 lbs from last visit- has not been eating lunch out and that has helped. Obesity -noted but fortunately BMI now under 35 so technically not morbidly obese.  Weight largely stable from last year Wt Readings from Last 3 Encounters:  02/07/19 270 lb 3.2 oz (122.6 kg)  08/06/18 275 lb 12.8 oz (125.1 kg)  02/01/18 271 lb (122.9 kg)  3. Immunizations/screenings/ancillary studies-flu shot-we will get at work and let us know.  Shingrix- defer  until next year  Immunization History  Administered Date(s) Administered  . Influenza Whole 04/10/2009  . Influenza,inj,Quad PF,6+ Mos 03/29/2013  . Influenza-Unspecified 04/04/2014, 03/23/2015, 02/19/2016, 02/14/2017, 02/13/2018  . PPD Test 04/04/2014  . Td 04/06/2006  . Tdap 08/02/2016  4. Prostate cancer screening- follows with Dr. Diona Fanti.  Monitoring only at this point.  Has had biopsies in the past. Lab Results  Component Value Date   PSA 8.31 05/18/2018   PSA 9.24 02/23/2017   PSA 10.03 (H) 12/31/2015   5. Colon cancer screening - December 2016 with 10-year follow-up 6. Skin cancer screening-sees Dr. Elvera Lennox every 6 months due to history  of melanoma in situ. advised regular sunscreen use. Denies worrisome, changing, or new skin lesions.  7.  Never smoker  Status of chronic or acute concerns  Hypertension - Taking Losartan-HCTZ 50-12/5 mg daily. Checking BP at home. Staying consistently below 140/90. Denies HA, dizziness, CP, SOB, visual changes. Limiting salt intake. Eats minimal processed and fast foods.   GERD - Taking Aciphex 20 mg daily. Well managed.  Takes oral B12  BPH - Taking Alfuzosin 10 mg daily.   Hyperlipidemia - Taking Rosuvastatin 10 mg daily. Taking taking Omega 3 supplement.  Update lipid panel today -Strong family history of coronary artery disease-patient would like to continue aspirin as well  Hyperglycemia -last A1c was not elevated-patient would like to repeat today-intermittent elevations in the past Lab Results  Component Value Date   HGBA1C 5.4 08/06/2018    Nephrolithiasis- kidney stones right before son got married. toradol cooled off pain- he is not sure if he passed the stone though  Allergies- controlled with Astelin and Cosco brand antihistamine. Starting a little early this year  Insomnia- goes to bed 9 30 or 10 and gets up between 4-5 AM and in office by 6. Still feels rested as long as doesn't take antihistamine in AM. Also gets up to  void and brain engages. alfuzosin at night has been somewhat helpful.   Recommended follow up: 6 months follow up   [place]   1. Preventative health care  Z00.00 PSA    CBC with Differential/Platelet    Comprehensive metabolic panel    Hemoglobin A1c    Lipid panel  2. Hyperlipidemia, unspecified hyperlipidemia type  E78.5 CBC with Differential/Platelet    Comprehensive metabolic panel    Lipid panel  3. Essential hypertension  I10   4. Osteoarthritis, unspecified osteoarthritis type, unspecified site  M19.90   5. Hyperglycemia  R73.9 Hemoglobin A1c  6. Screening for prostate cancer  Z12.5 PSA    Meds ordered this encounter  Medications  . azelastine (ASTELIN) 0.1 % nasal spray    Sig: USE 1 SPRAY INTO BOTH NOSTRILS TWICE DAILY.    Dispense:  30 mL    Refill:  5    Return precautions advised.  Garret Reddish, MD

## 2019-02-07 ENCOUNTER — Other Ambulatory Visit: Payer: Self-pay

## 2019-02-07 ENCOUNTER — Encounter: Payer: Self-pay | Admitting: Family Medicine

## 2019-02-07 ENCOUNTER — Ambulatory Visit (INDEPENDENT_AMBULATORY_CARE_PROVIDER_SITE_OTHER): Payer: 59 | Admitting: Family Medicine

## 2019-02-07 VITALS — BP 122/82 | HR 58 | Temp 97.3°F | Ht 74.0 in | Wt 270.2 lb

## 2019-02-07 DIAGNOSIS — M1990 Unspecified osteoarthritis, unspecified site: Secondary | ICD-10-CM | POA: Diagnosis not present

## 2019-02-07 DIAGNOSIS — R739 Hyperglycemia, unspecified: Secondary | ICD-10-CM

## 2019-02-07 DIAGNOSIS — Z125 Encounter for screening for malignant neoplasm of prostate: Secondary | ICD-10-CM | POA: Diagnosis not present

## 2019-02-07 DIAGNOSIS — Z0000 Encounter for general adult medical examination without abnormal findings: Secondary | ICD-10-CM

## 2019-02-07 DIAGNOSIS — E785 Hyperlipidemia, unspecified: Secondary | ICD-10-CM | POA: Diagnosis not present

## 2019-02-07 DIAGNOSIS — I10 Essential (primary) hypertension: Secondary | ICD-10-CM | POA: Diagnosis not present

## 2019-02-07 LAB — CBC WITH DIFFERENTIAL/PLATELET
Basophils Absolute: 0 10*3/uL (ref 0.0–0.1)
Basophils Relative: 0.6 % (ref 0.0–3.0)
Eosinophils Absolute: 0.1 10*3/uL (ref 0.0–0.7)
Eosinophils Relative: 3 % (ref 0.0–5.0)
HCT: 42.8 % (ref 39.0–52.0)
Hemoglobin: 14.6 g/dL (ref 13.0–17.0)
Lymphocytes Relative: 28 % (ref 12.0–46.0)
Lymphs Abs: 1.2 10*3/uL (ref 0.7–4.0)
MCHC: 34.1 g/dL (ref 30.0–36.0)
MCV: 86.7 fl (ref 78.0–100.0)
Monocytes Absolute: 0.2 10*3/uL (ref 0.1–1.0)
Monocytes Relative: 5.4 % (ref 3.0–12.0)
Neutro Abs: 2.8 10*3/uL (ref 1.4–7.7)
Neutrophils Relative %: 63 % (ref 43.0–77.0)
Platelets: 201 10*3/uL (ref 150.0–400.0)
RBC: 4.94 Mil/uL (ref 4.22–5.81)
RDW: 13.4 % (ref 11.5–15.5)
WBC: 4.4 10*3/uL (ref 4.0–10.5)

## 2019-02-07 LAB — PSA: PSA: 11.49 ng/mL — ABNORMAL HIGH (ref 0.10–4.00)

## 2019-02-07 LAB — COMPREHENSIVE METABOLIC PANEL
ALT: 26 U/L (ref 0–53)
AST: 19 U/L (ref 0–37)
Albumin: 4.3 g/dL (ref 3.5–5.2)
Alkaline Phosphatase: 80 U/L (ref 39–117)
BUN: 19 mg/dL (ref 6–23)
CO2: 29 mEq/L (ref 19–32)
Calcium: 9.2 mg/dL (ref 8.4–10.5)
Chloride: 104 mEq/L (ref 96–112)
Creatinine, Ser: 0.84 mg/dL (ref 0.40–1.50)
GFR: 95.21 mL/min (ref >60.00)
Glucose, Bld: 88 mg/dL (ref 70–99)
Potassium: 4.2 mEq/L (ref 3.5–5.1)
Sodium: 140 mEq/L (ref 135–145)
Total Bilirubin: 0.6 mg/dL (ref 0.2–1.2)
Total Protein: 6.4 g/dL (ref 6.0–8.3)

## 2019-02-07 LAB — LIPID PANEL
Cholesterol: 97 mg/dL (ref 0–200)
HDL: 31.9 mg/dL — ABNORMAL LOW (ref >39.00)
LDL Cholesterol: 51 mg/dL (ref 0–99)
NonHDL: 64.66
Total CHOL/HDL Ratio: 3
Triglycerides: 66 mg/dL (ref 0.0–149.0)
VLDL: 13.2 mg/dL (ref 0.0–40.0)

## 2019-02-07 LAB — HEMOGLOBIN A1C: Hgb A1c MFr Bld: 5.7 % (ref 4.6–6.5)

## 2019-02-07 MED ORDER — AZELASTINE HCL 0.1 % NA SOLN: NASAL | 5 refills | Status: DC

## 2019-02-24 ENCOUNTER — Other Ambulatory Visit: Payer: Self-pay | Admitting: Family Medicine

## 2019-02-25 ENCOUNTER — Encounter: Payer: Self-pay | Admitting: Family Medicine

## 2019-02-26 LAB — QUANTIFERON-TB GOLD PLUS: QUANTIFERON TB GOLD: NEGATIVE

## 2019-03-27 ENCOUNTER — Other Ambulatory Visit: Payer: Self-pay | Admitting: Family Medicine

## 2019-04-07 ENCOUNTER — Other Ambulatory Visit: Payer: Self-pay | Admitting: Family Medicine

## 2019-05-11 LAB — PSA: PSA: 10.2

## 2019-05-22 ENCOUNTER — Other Ambulatory Visit (HOSPITAL_COMMUNITY): Payer: Self-pay | Admitting: Urology

## 2019-05-22 ENCOUNTER — Other Ambulatory Visit: Payer: Self-pay | Admitting: Urology

## 2019-05-22 DIAGNOSIS — R972 Elevated prostate specific antigen [PSA]: Secondary | ICD-10-CM

## 2019-06-01 ENCOUNTER — Encounter: Payer: Self-pay | Admitting: Family Medicine

## 2019-06-05 ENCOUNTER — Ambulatory Visit (HOSPITAL_COMMUNITY)
Admission: RE | Admit: 2019-06-05 | Discharge: 2019-06-05 | Disposition: A | Discharge status: Home / Self Care | Payer: 59 | Source: Ambulatory Visit | Attending: Urology | Admitting: Urology

## 2019-06-05 ENCOUNTER — Other Ambulatory Visit: Payer: Self-pay

## 2019-06-05 DIAGNOSIS — R972 Elevated prostate specific antigen [PSA]: Secondary | ICD-10-CM | POA: Diagnosis not present

## 2019-06-05 LAB — POCT I-STAT CREATININE: Creatinine, Ser: 0.8 mg/dL (ref 0.61–1.24)

## 2019-06-05 MED ORDER — GADOBUTROL 1 MMOL/ML IV SOLN
10.0000 mL | Freq: Once | INTRAVENOUS | Status: AC | PRN
  Administered 2019-06-05: 10 mL via INTRAVENOUS

## 2019-06-28 ENCOUNTER — Encounter: Payer: Self-pay | Admitting: Family Medicine

## 2019-08-14 ENCOUNTER — Other Ambulatory Visit: Payer: Self-pay

## 2019-08-14 ENCOUNTER — Encounter: Payer: Self-pay | Admitting: Family Medicine

## 2019-08-14 ENCOUNTER — Ambulatory Visit (INDEPENDENT_AMBULATORY_CARE_PROVIDER_SITE_OTHER): Payer: 59 | Admitting: Family Medicine

## 2019-08-14 VITALS — BP 122/70 | HR 55 | Temp 97.6°F | Ht 74.0 in | Wt 277.2 lb

## 2019-08-14 DIAGNOSIS — E785 Hyperlipidemia, unspecified: Secondary | ICD-10-CM

## 2019-08-14 DIAGNOSIS — I10 Essential (primary) hypertension: Secondary | ICD-10-CM | POA: Diagnosis not present

## 2019-08-14 DIAGNOSIS — R739 Hyperglycemia, unspecified: Secondary | ICD-10-CM

## 2019-08-14 DIAGNOSIS — N401 Enlarged prostate with lower urinary tract symptoms: Secondary | ICD-10-CM

## 2019-08-14 LAB — COMPREHENSIVE METABOLIC PANEL
ALT: 41 U/L (ref 0–53)
AST: 26 U/L (ref 0–37)
Albumin: 4.3 g/dL (ref 3.5–5.2)
Alkaline Phosphatase: 72 U/L (ref 39–117)
BUN: 19 mg/dL (ref 6–23)
CO2: 30 mEq/L (ref 19–32)
Calcium: 9.5 mg/dL (ref 8.4–10.5)
Chloride: 102 mEq/L (ref 96–112)
Creatinine, Ser: 0.9 mg/dL (ref 0.40–1.50)
GFR: 87.76 mL/min (ref >60.00)
Glucose, Bld: 90 mg/dL (ref 70–99)
Potassium: 4.3 mEq/L (ref 3.5–5.1)
Sodium: 138 mEq/L (ref 135–145)
Total Bilirubin: 0.8 mg/dL (ref 0.2–1.2)
Total Protein: 6.6 g/dL (ref 6.0–8.3)

## 2019-08-14 LAB — HEMOGLOBIN A1C: Hgb A1c MFr Bld: 5.5 % (ref 4.6–6.5)

## 2019-08-14 LAB — LDL CHOLESTEROL, DIRECT: Direct LDL: 59 mg/dL

## 2019-08-14 NOTE — Patient Instructions (Addendum)
Nutritionfacts.org and search prediabetes  Also consider myfitnesspal app for calorie counting  Great to see you today!  Please stop by lab before you go If you do not have mychart- we will call you about results within 5 business days of [place] receiving them.  If you have mychart- we will send your results within 3 business days of [place] receiving them.  If abnormal or we want to clarify a result, we will call or mychart you to make sure you receive the message.  If you have questions or concerns or don't hear within 5 business days, please send [place] a message or call us.

## 2019-08-14 NOTE — Progress Notes (Signed)
Phone [phone] In person visit   Subjective:   Andrew Wilkins is a 54 y.o. year old very pleasant male patient who presents for/with See problem oriented charting Chief Complaint  Patient presents with  . Follow-up  . Hyperlipidemia  . Hypertension    This visit occurred during the SARS-CoV-2 public health emergency.  Safety protocols were in place, including screening questions prior to the visit, additional usage of staff PPE, and extensive cleaning of exam room while observing appropriate contact time as indicated for disinfecting solutions.   Past Medical History-  Patient Active Problem List   Diagnosis Date Noted  . History of melanoma in situ 08/02/2016    Priority: Medium  . Hyperglycemia 10/05/2015    Priority: Medium  . BPH (benign prostatic hyperplasia) 04/02/2014    Priority: Medium  . Family history of peripheral vascular disease 03/17/2011    Priority: Medium  . Lipoma of back 07/08/2009    Priority: Medium  . Hyperlipidemia 02/26/2007    Priority: Medium  . Essential hypertension 02/26/2007    Priority: Medium  . Osteoarthritis 02/26/2007    Priority: Medium  . Status post bilateral hip replacements 12/04/2015    Priority: Low  . Hemangioma of liver 04/02/2014    Priority: Low  . Nephrolithiasis 04/02/2014    Priority: Low  . GERD 06/18/2007    Priority: Low  . INSOMNIA, PERSISTENT 02/26/2007    Priority: Low     Medications- reviewed and updated Current Outpatient Medications  Medication Sig Dispense Refill  . acetaminophen (TYLENOL) 500 MG tablet Take 1,000 mg by mouth daily as needed.     Marland Kitchen alfuzosin (UROXATRAL) 10 MG 24 hr tablet Take 10 mg by mouth daily with breakfast.    . aspirin EC 81 MG tablet Take 81 mg by mouth daily.    Marland Kitchen azelastine (ASTELIN) 0.1 % nasal spray USE 1 SPRAY INTO BOTH NOSTRILS TWICE DAILY. 30 mL 5  . cyclobenzaprine (FLEXERIL) 10 MG tablet Take 1 tablet (10 mg total) by mouth 3 (three) times daily as needed for  muscle spasms. 30 tablet 0  . diclofenac (VOLTAREN) 75 MG EC tablet Take 1 tablet (75 mg total) by mouth 2 (two) times daily. 30 tablet 0  . fish oil-omega-3 fatty acids 1000 MG capsule Take 2 g by mouth daily.      . folic acid (FOLVITE) 1 MG tablet TAKE 1 TABLET BY MOUTH  DAILY 90 tablet 3  . losartan-hydrochlorothiazide (HYZAAR) 50-12.5 MG tablet TAKE 1 TABLET BY MOUTH  DAILY 90 tablet 3  . meloxicam (MOBIC) 15 MG tablet     . Multiple Vitamin (MULTIVITAMIN WITH MINERALS) TABS tablet Take 1 tablet by mouth daily.    . RABEprazole (ACIPHEX) 20 MG tablet TAKE 1 TABLET BY MOUTH  DAILY 90 tablet 3  . rosuvastatin (CRESTOR) 10 MG tablet TAKE 1 TABLET BY MOUTH  DAILY 90 tablet 3   No current facility-administered medications for this visit.     Objective:  BP 122/70   Pulse (!) 55   Temp 97.6 F (36.4 C)   Ht 6\' 2"  (1.88 m)   Wt 277 lb 3.2 oz (125.7 kg)   SpO2 97%   BMI 35.59 kg/m  Gen: NAD, resting comfortably CV: RRR no murmurs rubs or gallops Lungs: CTAB no crackles, wheeze, rhonchi Abdomen: soft/nontender/nondistended/normal bowel sounds. No rebound or guarding.  Ext: no edema Skin: warm, dry Neuro: grossly normal, moves all extremities    Assessment and Plan   #  social update- busy working with [employer] sites for vaccination. He is already personally vaccinated. Able to go to son's home games at least now- they are ranked #3 in [place]  #Hyperlipidemia S:Compliant with Crestor 10 mg.  Strong family history of cardiovascular disease.  We have opted to continue aspirin as result. Lab Results  Component Value Date   CHOL 97 02/07/2019   HDL 31.90 (L) 02/07/2019   LDLCALC 51 02/07/2019   LDLDIRECT 53.0 01/23/2017   TRIG 66.0 02/07/2019   CHOLHDL 3 02/07/2019  A/P: Stable. Continue current medications.      #Essential hypertension S: Compliant with alfuzosin, losartan 50 mg, hydrochlorothiazide 12.5 mg. Home checks similar to in office A/P: Stable. Continue current  medications.  -some mild orthostatic hypotension but manageable- alfuzosin is big player for this.     #Morbid obesity S: BMI over 35 with hypertension and hyperlipidemia. Wt Readings from Last 3 Encounters:  08/14/19 277 lb 3.2 oz (125.7 kg)  02/07/19 270 lb 3.2 oz (122.6 kg)  08/06/18 275 lb 12.8 oz (125.1 kg)  A/P: weight elevated slightly- Encouraged need for healthy eating, regular exercise, weight loss.     #Nephrolithiasis S: Patient has a few hydrocodone on hand in case has acute flareup to keep him out of emergency room. Had one flare up last summer but didn't have to go to hospital- was able to do ok with advil that time. Had ultrasound after and everything was reassuring A/P: [place]- continue to monitor   % BPH-follows with Dr. Dahlstedt/elevated PSA S: Patient compliant with alfuzosin MRI dec 2020 with PSA over 10 reassuring A/P:Stable. Continue current medications.  - luckily BPH only     #Hyperglycemia S: Intermittent mild A1c elevations in the low fives. Exercise- doing as well as he can with time limitations- he is considering getting back to the gym Diet- doing a better job eating regular meals. Feels like portion size is reasonable.  Lab Results  Component Value Date   HGBA1C 5.7 02/07/2019  A/P: we will update a1c today- hopefully stable   #GERD S: Compliant with AcipHex.  He is on oral B12. Has not done well with h2 blocker trials in past A/P: we discussed if getting weight down may trial again but apparently has had bad esophageal spasm with bad chest pain so hesitant to go down.    Recommended follow up:  Future Appointments  Date Time Provider [place]  02/13/2020  8:20 AM Marin Olp, MD [facility]   Lab/Order associations:   ICD-10-CM   1. Hyperlipidemia, unspecified hyperlipidemia type  E78.5 LDL cholesterol, direct  2. Hyperglycemia  R73.9 Hemoglobin A1c  3. Essential hypertension  I10 Comprehensive metabolic panel  4. Benign  prostatic hyperplasia with lower urinary tract symptoms, symptom details unspecified  N40.1   5. Morbid obesity ([place])  E66.01    Return precautions advised.  Garret Reddish, MD

## 2019-09-29 DIAGNOSIS — R1032 Left lower quadrant pain: Secondary | ICD-10-CM | POA: Insufficient documentation

## 2019-09-29 DIAGNOSIS — N400 Enlarged prostate without lower urinary tract symptoms: Secondary | ICD-10-CM | POA: Diagnosis not present

## 2019-09-29 DIAGNOSIS — Z7982 Long term (current) use of aspirin: Secondary | ICD-10-CM | POA: Insufficient documentation

## 2019-09-29 DIAGNOSIS — I10 Essential (primary) hypertension: Secondary | ICD-10-CM | POA: Diagnosis not present

## 2019-09-29 DIAGNOSIS — R339 Retention of urine, unspecified: Secondary | ICD-10-CM | POA: Insufficient documentation

## 2019-09-29 DIAGNOSIS — Z79899 Other long term (current) drug therapy: Secondary | ICD-10-CM | POA: Insufficient documentation

## 2019-09-29 DIAGNOSIS — R310 Gross hematuria: Secondary | ICD-10-CM | POA: Insufficient documentation

## 2019-09-30 ENCOUNTER — Emergency Department (HOSPITAL_BASED_OUTPATIENT_CLINIC_OR_DEPARTMENT_OTHER)
Admission: EM | Admit: 2019-09-30 | Discharge: 2019-09-30 | Disposition: A | Discharge status: Home / Self Care | Payer: 59 | Attending: Emergency Medicine | Admitting: Emergency Medicine

## 2019-09-30 ENCOUNTER — Other Ambulatory Visit: Payer: Self-pay

## 2019-09-30 ENCOUNTER — Encounter (HOSPITAL_BASED_OUTPATIENT_CLINIC_OR_DEPARTMENT_OTHER): Payer: Self-pay | Admitting: *Deleted

## 2019-09-30 ENCOUNTER — Emergency Department (HOSPITAL_BASED_OUTPATIENT_CLINIC_OR_DEPARTMENT_OTHER): Payer: 59

## 2019-09-30 DIAGNOSIS — R339 Retention of urine, unspecified: Secondary | ICD-10-CM

## 2019-09-30 DIAGNOSIS — R310 Gross hematuria: Secondary | ICD-10-CM

## 2019-09-30 LAB — CBC WITH DIFFERENTIAL/PLATELET
Abs Immature Granulocytes: 0.04 10*3/uL (ref 0.00–0.07)
Basophils Absolute: 0 10*3/uL (ref 0.0–0.1)
Basophils Relative: 0 %
Eosinophils Absolute: 0 10*3/uL (ref 0.0–0.5)
Eosinophils Relative: 0 %
HCT: 43.3 % (ref 39.0–52.0)
Hemoglobin: 14.7 g/dL (ref 13.0–17.0)
Immature Granulocytes: 0 %
Lymphocytes Relative: 5 %
Lymphs Abs: 0.6 10*3/uL — ABNORMAL LOW (ref 0.7–4.0)
MCH: 29.6 pg (ref 26.0–34.0)
MCHC: 33.9 g/dL (ref 30.0–36.0)
MCV: 87.1 fL (ref 80.0–100.0)
Monocytes Absolute: 0.4 10*3/uL (ref 0.1–1.0)
Monocytes Relative: 4 %
Neutro Abs: 11.4 10*3/uL — ABNORMAL HIGH (ref 1.7–7.7)
Neutrophils Relative %: 91 %
Platelets: 219 10*3/uL (ref 150–400)
RBC: 4.97 MIL/uL (ref 4.22–5.81)
RDW: 13 % (ref 11.5–15.5)
WBC: 12.5 10*3/uL — ABNORMAL HIGH (ref 4.0–10.5)
nRBC: 0 % (ref 0.0–0.2)

## 2019-09-30 LAB — URINALYSIS, ROUTINE W REFLEX MICROSCOPIC
Bilirubin Urine: NEGATIVE
Glucose, UA: NEGATIVE mg/dL
Ketones, ur: NEGATIVE mg/dL
Leukocytes,Ua: NEGATIVE
Nitrite: NEGATIVE
Protein, ur: 30 mg/dL — AB
Specific Gravity, Urine: 1.01 (ref 1.005–1.030)
pH: 5.5 (ref 5.0–8.0)

## 2019-09-30 LAB — URINALYSIS, MICROSCOPIC (REFLEX): RBC / HPF: >50 RBC/hpf (ref 0–5)

## 2019-09-30 LAB — BASIC METABOLIC PANEL
Anion gap: 9 (ref 5–15)
BUN: 21 mg/dL — ABNORMAL HIGH (ref 6–20)
CO2: 24 mmol/L (ref 22–32)
Calcium: 9.5 mg/dL (ref 8.9–10.3)
Chloride: 108 mmol/L (ref 98–111)
Creatinine, Ser: 1.14 mg/dL (ref 0.61–1.24)
GFR calc Af Amer: >60 mL/min (ref >60)
GFR calc non Af Amer: >60 mL/min (ref >60)
Glucose, Bld: 146 mg/dL — ABNORMAL HIGH (ref 70–99)
Potassium: 4.8 mmol/L (ref 3.5–5.1)
Sodium: 141 mmol/L (ref 135–145)

## 2019-09-30 MED ORDER — TAMSULOSIN HCL 0.4 MG PO CAPS: 0.4000 mg | ORAL_CAPSULE | Freq: Every day | ORAL | 0 refills | Status: DC

## 2019-09-30 NOTE — ED Notes (Signed)
Bladder scan 721ml

## 2019-09-30 NOTE — ED Triage Notes (Signed)
Pt reports urinary retention; left flank pain; hematuria. Last voided around 5pm

## 2019-09-30 NOTE — Discharge Instructions (Addendum)
You were seen today for urinary retention.  Keep Foley catheter in place until follow-up with Dr. Diona Fanti later this week.  No evidence of urinary tract infection.  Your x-ray does not show an obvious stone.  If you develop fevers, worsening flank pain, any new or worsening symptoms, you should be reevaluated sooner.

## 2019-09-30 NOTE — ED Provider Notes (Signed)
[facility] EMERGENCY DEPARTMENT Provider Note   CSN: [id number] Arrival date & time: 09/29/19  2359     History Chief Complaint  Patient presents with  . Urinary Retention    Andrew Wilkins is a 54 y.o. male.  HPI     This a 54 year old male with a history of BPH, hypertension, hyperlipidemia, kidney stones who presents with urinary retention and left flank pain.  Patient reports he has not urinated since 5 PM yesterday.  He reported suprapubic pressure and pain as well as nausea.  On my evaluation he had a catheter placed and had 1100 output.  Patient reports extensive history of BPH but no history of urinary retention.  He reports significant improvement of symptoms with catheter in place.  Of note he did have some left-sided flank pain.  Currently no pain.  He has a history of kidney stones.  He states that this pain is not as bad as his prior kidney stones.  He is also noted some hematuria.  He is not had any fevers.  No dysuria.  Past Medical History:  Diagnosis Date  . Arthritis   . BPH (benign prostatic hyperplasia)   . GERD (gastroesophageal reflux disease)   . Hyperlipidemia   . Hypertension   . Kidney stone   . Plantar fasciitis    cortisone injection    Patient Active Problem List   Diagnosis Date Noted  . History of melanoma in situ 08/02/2016  . Status post bilateral hip replacements 12/04/2015  . Hyperglycemia 10/05/2015  . BPH (benign prostatic hyperplasia) 04/02/2014  . Hemangioma of liver 04/02/2014  . Nephrolithiasis 04/02/2014  . Family history of peripheral vascular disease 03/17/2011  . Lipoma of back 07/08/2009  . GERD 06/18/2007  . Hyperlipidemia 02/26/2007  . INSOMNIA, PERSISTENT 02/26/2007  . Essential hypertension 02/26/2007  . Osteoarthritis 02/26/2007    Past Surgical History:  Procedure Laterality Date  . BILATERAL ANTERIOR TOTAL HIP ARTHROPLASTY Bilateral 12/04/2015   Procedure: BILATERAL ANTERIOR APPROACH TOTAL HIP  ARTHROPLASTY;  Surgeon: Mcarthur Rossetti, MD;  Location: [facility];  Service: Orthopedics;  Laterality: Bilateral;  . left eye surgery     left eye surgery-squint surgery-age 61  . none    . TONSILLECTOMY     age 33       Family History  Problem Relation Age of Onset  . Lung cancer Father        former smoker  . Heart disease Father 62       PAD (carotid endarterectomy at 24) and CAD with MI in late 22s early 85s.   . Heart disease Sister 89       mi, smoker  . Crohn's disease Daughter   . Stroke Paternal Grandfather 45  . Stroke Brother 84       physician who stopped practicing as result    Social History   Tobacco Use  . Smoking status: Never Smoker  . Smokeless tobacco: Never Used  Substance Use Topics  . Alcohol use: Yes    Comment: 3 per month  . Drug use: No    Home Medications Prior to Admission medications   Medication Sig Start Date End Date Taking? Authorizing Provider  acetaminophen (TYLENOL) 500 MG tablet Take 1,000 mg by mouth daily as needed.     [provider]  alfuzosin (UROXATRAL) 10 MG 24 hr tablet Take 10 mg by mouth daily with breakfast.    [provider]  aspirin EC 81 MG tablet  Take 81 mg by mouth daily.    [provider]  azelastine (ASTELIN) 0.1 % nasal spray USE 1 SPRAY INTO BOTH NOSTRILS TWICE DAILY. 02/07/19   Marin Olp, MD  cyclobenzaprine (FLEXERIL) 10 MG tablet Take 1 tablet (10 mg total) by mouth 3 (three) times daily as needed for muscle spasms. 07/17/17   Vivi Barrack, MD  diclofenac (VOLTAREN) 75 MG EC tablet Take 1 tablet (75 mg total) by mouth 2 (two) times daily. 07/17/17   Vivi Barrack, MD  fish oil-omega-3 fatty acids 1000 MG capsule Take 2 g by mouth daily.      [provider]  folic acid (FOLVITE) 1 MG tablet TAKE 1 TABLET BY MOUTH  DAILY 03/28/19   Marin Olp, MD  losartan-hydrochlorothiazide [facility]) 50-12.5 MG tablet TAKE 1 TABLET BY MOUTH  DAILY 04/08/19   Marin Olp, MD  meloxicam [facility]) 15 MG tablet  07/17/18   [provider]  Multiple Vitamin (MULTIVITAMIN WITH MINERALS) TABS tablet Take 1 tablet by mouth daily.    [provider]  RABEprazole (ACIPHEX) 20 MG tablet TAKE 1 TABLET BY MOUTH  DAILY 03/28/19   Marin Olp, MD  rosuvastatin (CRESTOR) 10 MG tablet TAKE 1 TABLET BY MOUTH  DAILY 02/25/19   Marin Olp, MD  tamsulosin (FLOMAX) 0.4 MG CAPS capsule Take 1 capsule (0.4 mg total) by mouth daily. 09/30/19   Onyinyechi Huante, Barbette Hair, MD    Allergies    Patient has no known allergies.  Review of Systems   Review of Systems  Constitutional: Negative for chills and fever.  Respiratory: Negative for shortness of breath.   Cardiovascular: Negative for chest pain.  Gastrointestinal: Positive for abdominal pain. Negative for nausea and vomiting.  Genitourinary: Positive for difficulty urinating and flank pain.    Physical Exam Updated Vital Signs BP 133/66 (BP Location: Right Arm)   Pulse 71   Temp 98.2 F (36.8 C) (Oral)   Resp 16   Ht 1.88 m (6\' 2" )   Wt 120.2 kg   SpO2 96%   BMI 34.02 kg/m   Physical Exam Vitals and nursing note reviewed.  Constitutional:      Appearance: He is well-developed. He is not ill-appearing.  HENT:     Head: Normocephalic and atraumatic.     Nose: Nose normal.     Mouth/Throat:     Mouth: Mucous membranes are moist.  Eyes:     Pupils: Pupils are equal, round, and reactive to light.  Cardiovascular:     Rate and Rhythm: Normal rate and regular rhythm.  Pulmonary:     Effort: Pulmonary effort is normal. No respiratory distress.  Abdominal:     General: Bowel sounds are normal.     Palpations: Abdomen is soft.     Tenderness: There is no abdominal tenderness. There is no right CVA tenderness, left CVA tenderness, guarding or rebound.  Genitourinary:    Comments: Foley catheter in place, 1100cc in bag Musculoskeletal:     Cervical back: Neck supple.  Skin:     General: Skin is warm and dry.  Neurological:     Mental Status: He is alert and oriented to person, place, and time.  Psychiatric:        Mood and Affect: Mood normal.     ED Results / Procedures / Treatments   Labs (all labs ordered are listed, but only abnormal results are displayed) Labs Reviewed  URINALYSIS, ROUTINE W REFLEX MICROSCOPIC -  Abnormal; Notable for the following components:      Result Value   Color, Urine RED (*)    APPearance CLOUDY (*)    Hgb urine dipstick LARGE (*)    Protein, ur 30 (*)    All other components within normal limits  CBC WITH DIFFERENTIAL/PLATELET - Abnormal; Notable for the following components:   WBC 12.5 (*)    Neutro Abs 11.4 (*)    Lymphs Abs 0.6 (*)    All other components within normal limits  BASIC METABOLIC PANEL - Abnormal; Notable for the following components:   Glucose, Bld 146 (*)    BUN 21 (*)    All other components within normal limits  URINALYSIS, MICROSCOPIC (REFLEX) - Abnormal; Notable for the following components:   Bacteria, UA FEW (*)    All other components within normal limits    EKG None  Radiology DG Abdomen 1 View  Result Date: 09/30/2019 CLINICAL DATA:  Left flank pain, history of renal stone EXAM: ABDOMEN - 1 VIEW COMPARISON:  None. FINDINGS: The bowel gas pattern is normal. No radio-opaque calculi or other significant radiographic abnormality are seen. Bilateral total hip arthroplasty seen. IMPRESSION: Negative. Electronically Signed   By: Prudencio Pair M.D.   On: 09/30/2019 01:33    Procedures Procedures (including critical care time)  Medications Ordered in ED Medications - No data to display  ED Course  I have reviewed the triage vital signs and the nursing notes.  Pertinent labs & imaging results that were available during my care of the patient were reviewed by me and considered in my medical decision making (see chart for details).    MDM Rules/Calculators/A&P                        Patient presents with urinary retention.  History of BPH but no known history of urinary retention.  He states he feels much more comfortable with Foley catheter in place.  He is overall nontoxic and vital signs are reassuring.  He is afebrile.  He did have some left flank pain which is largely resolved at this time.  Retention is likely related to BPH but could also be related to UTI or kidney stone.  Will obtain screening x-ray to rule out large obstructing stone.  Additionally, will obtain some basic lab work and urinalysis to evaluate for kidney function and UTI.  Lab work with slight leukocytosis which is nonspecific.  BMP with preserved renal function.  Urinalysis shows no obvious UTI.  Discussed with patient follow-up with his urologist later this week for Foley removal and urodynamic testing.  Does not appear that he is on Flomax.  Will place on Flomax until follow-up.  Patient was given instructions regarding Foley catheter care.  After history, exam, and medical workup I feel the patient has been appropriately medically screened and is safe for discharge home. Pertinent diagnoses were discussed with the patient. Patient was given return precautions.   Final Clinical Impression(s) / ED Diagnoses Final diagnoses:  Urinary retention  Gross hematuria    Rx / DC Orders ED Discharge Orders         Ordered    tamsulosin (FLOMAX) 0.4 MG CAPS capsule  Daily     09/30/19 0221           Merryl Hacker, MD 09/30/19 [phone]

## 2019-11-09 LAB — PSA: PSA: 10.3

## 2019-11-18 ENCOUNTER — Encounter: Payer: Self-pay | Admitting: Family Medicine

## 2020-01-20 ENCOUNTER — Encounter: Payer: Self-pay | Admitting: Family Medicine

## 2020-01-20 ENCOUNTER — Other Ambulatory Visit: Payer: Self-pay

## 2020-01-20 ENCOUNTER — Ambulatory Visit: Payer: 59 | Admitting: Family Medicine

## 2020-01-20 VITALS — BP 148/88 | HR 60 | Temp 97.9°F | Ht 74.0 in | Wt 283.6 lb

## 2020-01-20 DIAGNOSIS — B029 Zoster without complications: Secondary | ICD-10-CM

## 2020-01-20 DIAGNOSIS — L309 Dermatitis, unspecified: Secondary | ICD-10-CM

## 2020-01-20 MED ORDER — VALACYCLOVIR HCL 1 G PO TABS: 1000.0000 mg | ORAL_TABLET | Freq: Three times a day (TID) | ORAL | 0 refills | Status: DC

## 2020-01-20 MED ORDER — TRIAMCINOLONE ACETONIDE 0.1 % EX CREA: 1.0000 "application " | TOPICAL_CREAM | Freq: Two times a day (BID) | CUTANEOUS | 0 refills | Status: DC

## 2020-01-20 NOTE — Patient Instructions (Signed)
Sent in valtrex for you to start. Take one pill three times a day for 7 days.   Shingles  Shingles is an infection. It gives you a painful skin rash and blisters that have fluid in them. Shingles is caused by the same germ (virus) that causes chickenpox. Shingles only happens in people who:  Have had chickenpox.  Have been given a shot of medicine (vaccine) to protect against chickenpox. Shingles is rare in this group. The first symptoms of shingles may be itching, tingling, or pain in an area on your skin. A rash will show on your skin a few days or weeks later. The rash is likely to be on one side of your body. The rash usually has a shape like a belt or a band. Over time, the rash turns into fluid-filled blisters. The blisters will break open, change into scabs, and dry up. Medicines may:  Help with pain and itching.  Help you get better sooner.  Help to prevent long-term problems. Follow these instructions at home: Medicines  Take over-the-counter and prescription medicines only as told by your doctor.  Put on an anti-itch cream or numbing cream where you have a rash, blisters, or scabs. Do this as told by your doctor. Helping with itching and discomfort   Put cold, wet cloths (cold compresses) on the area of the rash or blisters as told by your doctor.  Cool baths can help you feel better. Try adding baking soda or dry oatmeal to the water to lessen itching. Do not bathe in hot water. Blister and rash care  Keep your rash covered with a loose bandage (dressing).  Wear loose clothing that does not rub on your rash.  Keep your rash and blisters clean. To do this, wash the area with mild soap and cool water as told by your doctor.  Check your rash every day for signs of infection. Check for: ? More redness, swelling, or pain. ? Fluid or blood. ? Warmth. ? Pus or a bad smell.  Do not scratch your rash. Do not pick at your blisters. To help you to not scratch: ? Keep your  fingernails clean and cut short. ? Wear gloves or mittens when you sleep, if scratching is a problem. General instructions  Rest as told by your doctor.  Keep all follow-up visits as told by your doctor. This is important.  Wash your hands often with soap and water. If soap and water are not available, use hand sanitizer. Doing this lowers your chance of getting a skin infection caused by germs (bacteria).  Your infection can cause chickenpox in people who have never had chickenpox or never got a shot of chickenpox vaccine. If you have blisters that did not change into scabs yet, try not to touch other people or be around other people, especially: ? Babies. ? Pregnant women. ? Children who have areas of red, itchy, or rough skin (eczema). ? Very old people who have transplants. ? People who have a long-term (chronic) sickness, like cancer or AIDS. Contact a doctor if:  Your pain does not get better with medicine.  Your pain does not get better after the rash heals.  You have any signs of infection in the rash area. These signs include: ? More redness, swelling, or pain around the rash. ? Fluid or blood coming from the rash. ? The rash area feeling warm to the touch. ? Pus or a bad smell coming from the rash. Get help right away if:  The rash is on your face or nose.  You have pain in your face or pain by your eye.  You lose feeling on one side of your face.  You have trouble seeing.  You have ear pain, or you have ringing in your ear.  You have a loss of taste.  Your condition gets worse. Summary  Shingles gives you a painful skin rash and blisters that have fluid in them.  Shingles is an infection. It is caused by the same germ (virus) that causes chickenpox.  Keep your rash covered with a loose bandage (dressing). Wear loose clothing that does not rub on your rash.  If you have blisters that did not change into scabs yet, try not to touch other people or be around  people. This information is not intended to replace advice given to you by your health care provider. Make sure you discuss any questions you have with your health care provider. Document Revised: 09/14/2018 Document Reviewed: 01/25/2017 Elsevier Patient Education  2020 [employer].

## 2020-01-20 NOTE — Progress Notes (Signed)
Patient: Andrew Wilkins MRN: [id number] DOB: 02/27/1966 PCP: Marin Olp, MD     Subjective:  Chief Complaint  Patient presents with  . Rash    HPI: The patient is a 54 y.o. male who presents today for what he thought was insect bite last week. He says that it started looking like poson ivy. It started on his right forearm and it was itchy and vesicular. This started middle of the last week. It was weepy and dried up. He then had some itching on his waste and had some spots dry up and know has a rash that is on a erythematous base with some scabbing. He has some itching on his right pectoral area, but no rash has broken out yet. He has put hydrocortisone cream on his arm only. It has not helped at all. He denies being around any plants/weeds/plant contact.    Review of Systems  Constitutional: Negative for chills and fever.  Respiratory: Negative for cough, shortness of breath and wheezing.   Gastrointestinal: Negative for nausea and vomiting.  Skin: Positive for rash.    Allergies Patient has No Known Allergies.  Past Medical History Patient  has a past medical history of Arthritis, BPH (benign prostatic hyperplasia), GERD (gastroesophageal reflux disease), Hyperlipidemia, Hypertension, Kidney stone, and Plantar fasciitis.  Surgical History Patient  has a past surgical history that includes none; Tonsillectomy; left eye surgery; and Bilateral anterior total hip arthroplasty (Bilateral, 12/04/2015).  Family History Pateint's family history includes Crohn's disease in his daughter; Heart disease (age of onset: 68) in his father; Heart disease (age of onset: 37) in his sister; Lung cancer in his father; Stroke (age of onset: 3) in his brother; Stroke (age of onset: 22) in his paternal grandfather.  Social History Patient  reports that he has never smoked. He has never used smokeless tobacco. He reports current alcohol use. He reports that he does not use drugs.     Objective: Vitals:   01/20/20 1403  BP: (!) 148/88  Pulse: 60  Temp: 97.9 F (36.6 C)  TempSrc: Temporal  SpO2: 97%  Weight: 283 lb 9.6 oz (128.6 kg)  Height: 6\' 2"  (1.88 m)    Body mass index is 36.41 kg/m.  Physical Exam Vitals reviewed.  Constitutional:      Appearance: Normal appearance. He is obese.  HENT:     Head: Normocephalic and atraumatic.  Pulmonary:     Effort: Pulmonary effort is normal.  Skin:    Findings: Rash (rash on right forearm is vesicular in cluster. rash in dermatomal pattern on right lower abdomen. rash on erythematous base with a few black scabs. ) present.  Neurological:     Mental Status: He is alert.        Assessment/plan: 1. Herpes zoster without complication 7 day course of valtrex. No pain, but ibuprofen/tylenol prn for pain. Discussed avoiding immunocompromised patients until rash is scabbed over. Let us know if not getting better. .   2. Dermatitis Rash on arm doesn't look like shingles per say to me. Triamcinolone given to use bid for 7 days.    This visit occurred during the SARS-CoV-2 public health emergency.  Safety protocols were in place, including screening questions prior to the visit, additional usage of staff PPE, and extensive cleaning of exam room while observing appropriate contact time as indicated for disinfecting solutions.    Return if symptoms worsen or fail to improve.   Orma Flaming, MD [place]  01/20/2020  

## 2020-02-13 ENCOUNTER — Encounter: Payer: 59 | Admitting: Family Medicine

## 2020-02-19 ENCOUNTER — Encounter: Payer: Self-pay | Admitting: Family Medicine

## 2020-03-10 ENCOUNTER — Other Ambulatory Visit: Payer: Self-pay | Admitting: Family Medicine

## 2020-03-15 ENCOUNTER — Other Ambulatory Visit: Payer: Self-pay | Admitting: Family Medicine

## 2020-05-07 ENCOUNTER — Other Ambulatory Visit: Payer: Self-pay | Admitting: Family Medicine

## 2020-05-14 ENCOUNTER — Encounter: Payer: Self-pay | Admitting: Family Medicine

## 2020-05-26 ENCOUNTER — Encounter: Payer: Self-pay | Admitting: Family Medicine

## 2020-06-11 ENCOUNTER — Other Ambulatory Visit: Payer: Self-pay | Admitting: Family Medicine

## 2020-06-18 ENCOUNTER — Encounter: Payer: Self-pay | Admitting: Family Medicine

## 2020-06-19 ENCOUNTER — Other Ambulatory Visit: Payer: Self-pay

## 2020-06-19 MED ORDER — LOSARTAN POTASSIUM-HCTZ 50-12.5 MG PO TABS: 1.0000 | ORAL_TABLET | Freq: Every day | ORAL | 0 refills | Status: DC

## 2020-07-01 NOTE — Patient Instructions (Addendum)
Please stop by lab before you go If you have mychart- we will send your results within 3 business days of [place] receiving them.  If you do not have mychart- we will call you about results within 5 business days of [place] receiving them.  *please also note that you will see labs on mychart as soon as they post. I will later go in and write notes on them- will say "notes from Dr. Yong Channel"  Consider trazodone 50mg - before bed. Another option would be 25 mg when you wake up but wouldn't drive 6-8 hours after taking  Health Maintenance Due  Topic Date Due  . Hepatitis C Screening Done today in office  Never done   Recommended follow up: No follow-ups on file. But definitely 1 year for physical. If space to 1 year- keep eye on home blood pressure

## 2020-07-01 NOTE — Progress Notes (Signed)
Phone: [phone]   Subjective:  Patient presents today for their annual physical. Chief complaint-noted.   See problem oriented charting- ROS- full  review of systems was completed and negative  except for: tinnitus-occupational exposures, using readers, occasional rectal bleeding- up to date on colonoscopy- this is from hemorrhoids, indigestion slightly worse with stress and eating patterns- better with additional antacids or carbonated drinks - but already takes aciphex, urinary fequency and urgency, seasonal allergies  The following were reviewed and entered/updated in epic: Past Medical History:  Diagnosis Date  . Arthritis   . BPH (benign prostatic hyperplasia)   . GERD (gastroesophageal reflux disease)   . Hyperlipidemia   . Hypertension   . Kidney stone   . Plantar fasciitis    cortisone injection   Patient Active Problem List   Diagnosis Date Noted  . History of melanoma in situ 08/02/2016    Priority: Medium  . Hyperglycemia 10/05/2015    Priority: Medium  . BPH (benign prostatic hyperplasia) 04/02/2014    Priority: Medium  . Family history of peripheral vascular disease 03/17/2011    Priority: Medium  . Lipoma of back 07/08/2009    Priority: Medium  . Hyperlipidemia 02/26/2007    Priority: Medium  . Essential hypertension 02/26/2007    Priority: Medium  . Osteoarthritis 02/26/2007    Priority: Medium  . Morbid obesity ([place]) 01/30/2017    Priority: Low  . Status post bilateral hip replacements 12/04/2015    Priority: Low  . Hemangioma of liver 04/02/2014    Priority: Low  . Nephrolithiasis 04/02/2014    Priority: Low  . GERD 06/18/2007    Priority: Low  . INSOMNIA, PERSISTENT 02/26/2007    Priority: Low   Past Surgical History:  Procedure Laterality Date  . BILATERAL ANTERIOR TOTAL HIP ARTHROPLASTY Bilateral 12/04/2015   Procedure: BILATERAL ANTERIOR APPROACH TOTAL HIP ARTHROPLASTY;  Surgeon: Mcarthur Rossetti, MD;  Location: [facility];  Service:  Orthopedics;  Laterality: Bilateral;  . left eye surgery     left eye surgery-squint surgery-age 63  . none    . TONSILLECTOMY     age 1    Family History  Problem Relation Age of Onset  . Lung cancer Father        former smoker  . Heart disease Father 24       PAD (carotid endarterectomy at 64) and CAD with MI in late 13s early 78s.   . Heart disease Sister 56       mi, smoker  . Crohn's disease Daughter   . Stroke Paternal Grandfather 34  . Stroke Brother 47       physician who stopped practicing as result    Medications- reviewed and updated Current Outpatient Medications  Medication Sig Dispense Refill  . acetaminophen (TYLENOL) 500 MG tablet Take 1,000 mg by mouth daily as needed.     Marland Kitchen alfuzosin (UROXATRAL) 10 MG 24 hr tablet Take 10 mg by mouth daily with breakfast.    . aspirin EC 81 MG tablet Take 81 mg by mouth daily.    Marland Kitchen azelastine (ASTELIN) 0.1 % nasal spray USE 1 SPRAY INTO BOTH NOSTRILS TWICE DAILY. 30 mL 0  . fish oil-omega-3 fatty acids 1000 MG capsule Take 2 g by mouth daily.    . folic acid (FOLVITE) 1 MG tablet TAKE 1 TABLET BY MOUTH  DAILY 90 tablet 3  . HYDROcodone-acetaminophen (NORCO/VICODIN) 5-325 MG tablet Take 1 tablet by mouth every 6 (six) hours as needed for moderate  pain or severe pain (kidney stones). 5 tablet 0  . losartan-hydrochlorothiazide (HYZAAR) 50-12.5 MG tablet Take 1 tablet by mouth daily. 90 tablet 0  . Multiple Vitamin (MULTIVITAMIN WITH MINERALS) TABS tablet Take 1 tablet by mouth daily.    . RABEprazole (ACIPHEX) 20 MG tablet TAKE 1 TABLET BY MOUTH  DAILY 90 tablet 3  . rosuvastatin (CRESTOR) 10 MG tablet TAKE 1 TABLET BY MOUTH  DAILY 90 tablet 3  . cyclobenzaprine (FLEXERIL) 10 MG tablet Take 1 tablet (10 mg total) by mouth 3 (three) times daily as needed for muscle spasms. (Patient not taking: No sig reported) 30 tablet 0  . diclofenac (VOLTAREN) 75 MG EC tablet Take 1 tablet (75 mg total) by mouth 2 (two) times daily. (Patient not  taking: Reported on 07/02/2020) 30 tablet 0  . meloxicam (MOBIC) 15 MG tablet  (Patient not taking: Reported on 07/02/2020)    . tamsulosin (FLOMAX) 0.4 MG CAPS capsule Take 1 capsule (0.4 mg total) by mouth daily. (Patient not taking: Reported on 07/02/2020) 30 capsule 0  . triamcinolone cream (KENALOG) 0.1 % Apply 1 application topically 2 (two) times daily. (Patient not taking: Reported on 07/02/2020) 30 g 0  . valACYclovir (VALTREX) 1000 MG tablet Take 1 tablet (1,000 mg total) by mouth 3 (three) times daily. (Patient not taking: Reported on 07/02/2020) 21 tablet 0   No current facility-administered medications for this visit.    Allergies-reviewed and updated No Known Allergies  Social History   Social History Narrative   Married 1990. 3 kids- 15-48 years old in 2021. Son in [place] now married working for [employer]. Daughter in [place]. Son Medical sales representative in 2020.    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 Oropharynx normal Neck: no thyromegaly CV: RRR no murmurs rubs or gallops Lungs: CTAB no crackles, wheeze, rhonchi Abdomen: soft/nontender/nondistended/normal bowel sounds. No rebound or guarding.  Ext: no edema Skin: warm, dry Neuro: grossly normal, moves all extremities, PERRLA   Assessment and Plan  55 y.o. male presenting for annual physical.  Health Maintenance counseling: 1. Anticipatory guidance: Patient counseled regarding regular dental exams -q6 months, eye exams -yearly with dr. Valetta Close,  avoiding smoking and second hand smoke ,  limiting alcohol to 2 beverages per day- 1 per week perhaps (craft beer makes him urinate quickly), no drugs.   2. Risk factor reduction:  Advised patient of need for regular exercise and diet rich and fruits and vegetables to reduce risk of heart attack and stroke. Exercise- limited with pandemic- discussed trialing at home- trying to get steps back up- has been down some with his hours. Diet- Weight up 10 lbs from last CPE- Encouraged need for healthy eating, regular exercise, weight loss. Thinks needs to work on more regular meals- trying to do oatmeal. Thinks he could pack a healthier lunch instead of picking up. Eating healthy home meals. Feels does ok on portions Wt Readings from Last 3  Encounters:  07/02/20 280 lb 12.8 oz (127.4 kg)  01/20/20 283 lb 9.6 oz (128.6 kg)  09/30/19 265 lb (120.2 kg)  3. Immunizations/screenings/ancillary studies- up to date on vaccinations other than shingrix - wants to hold off for now- look at this next year- just got over shingles last summer. Discussed HCV screen - will do with bloodwork Immunization History  Administered Date(s) Administered  . Influenza Whole 04/10/2009, 02/22/2019  . Influenza,inj,Quad PF,6+ Mos 03/29/2013  . Influenza-Unspecified 04/04/2014, 03/23/2015, 02/19/2016, 02/14/2017, 02/13/2018, 02/18/2020  . Moderna Sars-Covid-2 Vaccination 05/29/2019, 06/26/2019, 04/09/2020  . PPD Test 04/04/2014  . Td 04/06/2006  . Tdap 08/02/2016   4. Prostate cancer screening-  follows with Dr. Diona Fanti- biopsies in past- monitoring only for now . Also on alfuzosin for BPH. Had obstruction and required self cath [place] term when [place] of [place]- keeps cath with him.  Lab Results  Component Value Date   PSA 10.3 11/09/2019   PSA 10.20 05/11/2019   PSA 11.49 (H) 02/07/2019   5. Colon cancer screening - dec 2016 with 10 year repeat Dr. Earlean Shawl- likely [place] next one 6. Skin cancer screening- Dr. Rolinda Roan every 6 months. advised regular sunscreen use.  Denies worrisome, changing, or new skin lesions.  7. Never smoker 8. STD screening - only active with wife  Status of chronic or acute concerns   #Hyperlipidemia S:Compliant with Crestor 10 mg.  Strong family history of cardiovascular disease.  We have opted to continue aspirin as result. Lab Results  Component Value Date   CHOL 97 02/07/2019   HDL 31.90 (L) 02/07/2019   LDLCALC 51 02/07/2019   LDLDIRECT 59.0 08/14/2019   TRIG 66.0 02/07/2019   CHOLHDL 3 02/07/2019  A/P:  lipids have looked excellent update today - update today   #Essential hypertension S: Compliant with alfuzosin (helps slightly), losartan 50 mg, hydrochlorothiazide 12.5 mg BP Readings from Last 3 Encounters:  07/02/20 120/78  01/20/20 (!) 148/88  09/30/19 133/66  A/P: Stable. Continue current medications.     #Morbid obesity S: BMI over 35 with hypertension and hyperlipidemia. A/P:  poor control. Encouraged need for healthy eating, regular exercise, weight loss.    #Nephrolithiasis S: Patient has a few hydrocodone on hand in case has acute flareup to keep him out of emergency room. A/P: will refill this for him today- old one expired- over 2 years ago since last one   % BPH-follows with Dr. Dahlstedt/elevated PSA S: Patient compliant with alfuzosin MRI dec 2020 with PSA over 10 reassuring A/P:see discussion above   #Hyperglycemia S: Intermittent mild A1c elevations in the low fives. Lab Results  Component Value Date   HGBA1C 5.5 08/14/2019  A/P: will update a1c particularly with weight gain   #GERD S: Compliant with AcipHex.  He is on oral B12. Has not done well with h2 blocker trials in past. Flare up lately- relieved everytime with antacids A/P: stable- discussed GI referral potentially  #family history CAD in dad at 45 and brother with stroke at 61 and grandfather 57 of storke- we opted to refer to cardiology. Chest pain certainly sounds GI related but would like their opinion.   #insomnia-  didn't do well with lunesta and a few other choices. Has not tried trazodone -from avs "Consider trazodone 50mg - before bed. Another option would be 25 mg when you wake up but wouldn't drive 6-8 hours after taking"   Other notes: 1.  Bilateral hip replacements 2018- continues to do well  2.  Intermittent bleeding with  hemorrhoids-up-to-date on colonoscopy   Recommended follow up: Return in about 6 months (around 12/30/2020) for follow up- or sooner if needed.  Lab/Order associations: NOT fasting   ICD-10-CM   1. Preventative health care  Z00.00 CBC with Differential/Platelet    Comprehensive metabolic panel    Lipid panel    Hepatitis C antibody    Hemoglobin A1c    PSA  2. Essential hypertension  I10 CBC with Differential/Platelet    Comprehensive metabolic panel    Lipid panel  3. Gastroesophageal reflux disease without esophagitis  K21.9   4. Hyperglycemia  R73.9 Hemoglobin A1c  5. Hyperlipidemia, unspecified hyperlipidemia type  E78.5 CBC with Differential/Platelet    Comprehensive metabolic panel    Lipid panel  6. Encounter for hepatitis C screening test for low risk patient  Z11.59 Hepatitis C antibody  7. Screening for prostate cancer  Z12.5 PSA  8. Family history of coronary arteriosclerosis  Z82.49 Ambulatory referral to Cardiology  9. Morbid obesity ([place]) Chronic E66.01     Meds ordered this encounter  Medications  . HYDROcodone-acetaminophen (NORCO/VICODIN) 5-325 MG tablet    Sig: Take 1 tablet by mouth every 6 (six) hours as needed for moderate pain or severe pain (kidney stones).    Dispense:  5 tablet    Refill:  0    Return precautions advised.  Garret Reddish, MD

## 2020-07-02 ENCOUNTER — Other Ambulatory Visit: Payer: Self-pay

## 2020-07-02 ENCOUNTER — Encounter: Payer: Self-pay | Admitting: Family Medicine

## 2020-07-02 ENCOUNTER — Ambulatory Visit (INDEPENDENT_AMBULATORY_CARE_PROVIDER_SITE_OTHER): Payer: 59 | Admitting: Family Medicine

## 2020-07-02 VITALS — BP 120/78 | HR 70 | Temp 97.4°F | Ht 74.0 in | Wt 280.8 lb

## 2020-07-02 DIAGNOSIS — Z0000 Encounter for general adult medical examination without abnormal findings: Secondary | ICD-10-CM | POA: Diagnosis not present

## 2020-07-02 DIAGNOSIS — R739 Hyperglycemia, unspecified: Secondary | ICD-10-CM

## 2020-07-02 DIAGNOSIS — K219 Gastro-esophageal reflux disease without esophagitis: Secondary | ICD-10-CM | POA: Diagnosis not present

## 2020-07-02 DIAGNOSIS — Z125 Encounter for screening for malignant neoplasm of prostate: Secondary | ICD-10-CM

## 2020-07-02 DIAGNOSIS — Z8249 Family history of ischemic heart disease and other diseases of the circulatory system: Secondary | ICD-10-CM

## 2020-07-02 DIAGNOSIS — E785 Hyperlipidemia, unspecified: Secondary | ICD-10-CM | POA: Diagnosis not present

## 2020-07-02 DIAGNOSIS — Z1159 Encounter for screening for other viral diseases: Secondary | ICD-10-CM

## 2020-07-02 DIAGNOSIS — I10 Essential (primary) hypertension: Secondary | ICD-10-CM

## 2020-07-02 LAB — CBC WITH DIFFERENTIAL/PLATELET
Basophils Absolute: 0 10*3/uL (ref 0.0–0.1)
Basophils Relative: 0.7 % (ref 0.0–3.0)
Eosinophils Absolute: 0.1 10*3/uL (ref 0.0–0.7)
Eosinophils Relative: 1.6 % (ref 0.0–5.0)
HCT: 44.8 % (ref 39.0–52.0)
Hemoglobin: 15.3 g/dL (ref 13.0–17.0)
Lymphocytes Relative: 25.8 % (ref 12.0–46.0)
Lymphs Abs: 1.2 10*3/uL (ref 0.7–4.0)
MCHC: 34.2 g/dL (ref 30.0–36.0)
MCV: 85.7 fl (ref 78.0–100.0)
Monocytes Absolute: 0.3 10*3/uL (ref 0.1–1.0)
Monocytes Relative: 5.7 % (ref 3.0–12.0)
Neutro Abs: 3.2 10*3/uL (ref 1.4–7.7)
Neutrophils Relative %: 66.2 % (ref 43.0–77.0)
Platelets: 216 10*3/uL (ref 150.0–400.0)
RBC: 5.23 Mil/uL (ref 4.22–5.81)
RDW: 13.1 % (ref 11.5–15.5)
WBC: 4.8 10*3/uL (ref 4.0–10.5)

## 2020-07-02 LAB — COMPREHENSIVE METABOLIC PANEL
ALT: 31 U/L (ref 0–53)
AST: 19 U/L (ref 0–37)
Albumin: 4.8 g/dL (ref 3.5–5.2)
Alkaline Phosphatase: 75 U/L (ref 39–117)
BUN: 16 mg/dL (ref 6–23)
CO2: 32 mEq/L (ref 19–32)
Calcium: 10 mg/dL (ref 8.4–10.5)
Chloride: 102 mEq/L (ref 96–112)
Creatinine, Ser: 0.94 mg/dL (ref 0.40–1.50)
GFR: 91.31 mL/min (ref >60.00)
Glucose, Bld: 89 mg/dL (ref 70–99)
Potassium: 4.3 mEq/L (ref 3.5–5.1)
Sodium: 140 mEq/L (ref 135–145)
Total Bilirubin: 0.8 mg/dL (ref 0.2–1.2)
Total Protein: 7.2 g/dL (ref 6.0–8.3)

## 2020-07-02 LAB — LIPID PANEL
Cholesterol: 108 mg/dL (ref 0–200)
HDL: 32.4 mg/dL — ABNORMAL LOW (ref >39.00)
LDL Cholesterol: 58 mg/dL (ref 0–99)
NonHDL: 75.42
Total CHOL/HDL Ratio: 3
Triglycerides: 88 mg/dL (ref 0.0–149.0)
VLDL: 17.6 mg/dL (ref 0.0–40.0)

## 2020-07-02 LAB — HEMOGLOBIN A1C: Hgb A1c MFr Bld: 5.8 % (ref 4.6–6.5)

## 2020-07-02 LAB — PSA: PSA: 10.61 ng/mL — ABNORMAL HIGH (ref 0.10–4.00)

## 2020-07-02 MED ORDER — HYDROCODONE-ACETAMINOPHEN 5-325 MG PO TABS: 1.0000 | ORAL_TABLET | Freq: Four times a day (QID) | ORAL | 0 refills | Status: AC | PRN

## 2020-07-03 LAB — HEPATITIS C ANTIBODY
Hepatitis C Ab: NONREACTIVE
SIGNAL TO CUT-OFF: 0.01 (ref <1.00)

## 2020-07-09 ENCOUNTER — Encounter: Payer: Self-pay | Admitting: Family Medicine

## 2020-07-11 MED ORDER — TRAZODONE HCL 50 MG PO TABS: 50.0000 mg | ORAL_TABLET | Freq: Every evening | ORAL | 3 refills | Status: DC | PRN

## 2020-07-14 ENCOUNTER — Encounter: Payer: Self-pay | Admitting: Internal Medicine

## 2020-07-14 ENCOUNTER — Ambulatory Visit: Payer: 59 | Admitting: Internal Medicine

## 2020-07-14 ENCOUNTER — Other Ambulatory Visit: Payer: Self-pay

## 2020-07-14 VITALS — BP 120/76 | HR 86 | Ht 74.0 in | Wt 281.4 lb

## 2020-07-14 DIAGNOSIS — E782 Mixed hyperlipidemia: Secondary | ICD-10-CM

## 2020-07-14 DIAGNOSIS — I10 Essential (primary) hypertension: Secondary | ICD-10-CM | POA: Diagnosis not present

## 2020-07-14 DIAGNOSIS — Z8249 Family history of ischemic heart disease and other diseases of the circulatory system: Secondary | ICD-10-CM | POA: Diagnosis not present

## 2020-07-14 NOTE — Patient Instructions (Addendum)
Medication Instructions:  Your physician recommends that you continue on your current medications as directed. Please refer to the Current Medication list given to you today.  *If you need a refill on your cardiac medications before your next appointment, please call your pharmacy*   Lab Work: NONE If you have labs (blood work) drawn today and your tests are completely normal, you will receive your results only by: Marland Kitchen MyChart Message (if you have MyChart) OR . A paper copy in the mail If you have any lab test that is abnormal or we need to change your treatment, we will call you to review the results.   Testing/Procedures: Cardiac CT Angiography (CTA) of aorta  Your physician has requested that you have an echocardiogram. Echocardiography is a painless test that uses sound waves to create images of your heart. It provides your doctor with information about the size and shape of your heart and how well your heart's chambers and valves are working. This procedure takes approximately one hour. There are no restrictions for this procedure.      Follow-Up: At [facility], you and your health needs are our priority.  As part of our continuing mission to provide you with exceptional heart care, we have created designated Provider Care Teams.  These Care Teams include your primary Cardiologist (physician) and Advanced Practice Providers (APPs -  Physician Assistants and Nurse Practitioners) who all work together to provide you with the care you need, when you need it.    Your next appointment:   3 month(s)  The format for your next appointment:   In Person  Provider:   You may see Gasper Sells, MD or one of the following Advanced Practice Providers on your designated Care Team:    Melina Copa, PA-C  Ermalinda Barrios, PA-C

## 2020-07-14 NOTE — Progress Notes (Signed)
Cardiology Office Note:    Date:  07/14/2020   ID:  Andrew Wilkins, DOB 05/08/1966, MRN [id number]  PCP:  Marin Olp, MD  [place] Cardiologist:  No primary care provider on file.  [facility] Electrophysiologist:  None   CC: SOB Consulted for the evaluation of prevention at the behest of Marin Olp, MD  History of Present Illness:    Andrew Wilkins is a 55 y.o. male with a hx of Morbid Obesity, HTN, HLD, Family history of PAD who presents for evaluation 07/14/20.  Patient notes that he is feeling new shortness of breath.  Has had no chest pain, chest pressure, chest tightness, chest stinging, but is having mid sternal burning.  No DOE, no radiation. Tied to an evening meal, relived with PPI and intervention. No SOB.  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  Patient exertion notable for shoveling snow and feels no symptoms.  No shortness of breath, DOE.  No PND or orthopnea.  No bendopnea, notes weight gain, notes no leg swelling , or abdominal swelling.  No syncope or near syncope . Notes  no palpitations or funny heart beats.     Patient reports NO  prior cardiac testing including  echo,  stress test,  heart catheterizations,  cardioversion,  ablations.  Past Medical History:  Diagnosis Date  . Arthritis   . BPH (benign prostatic hyperplasia)   . GERD (gastroesophageal reflux disease)   . Hyperlipidemia   . Hypertension   . Kidney stone   . Plantar fasciitis    cortisone injection    Past Surgical History:  Procedure Laterality Date  . BILATERAL ANTERIOR TOTAL HIP ARTHROPLASTY Bilateral 12/04/2015   Procedure: BILATERAL ANTERIOR APPROACH TOTAL HIP ARTHROPLASTY;  Surgeon: Mcarthur Rossetti, MD;  Location: [facility];  Service: Orthopedics;  Laterality: Bilateral;  . left eye surgery     left eye surgery-squint surgery-age 67  . none    . TONSILLECTOMY     age 7    Current Medications: Current Meds  Medication Sig  .  acetaminophen (TYLENOL) 500 MG tablet Take 1,000 mg by mouth daily as needed.   Marland Kitchen alfuzosin (UROXATRAL) 10 MG 24 hr tablet Take 10 mg by mouth daily with breakfast.  . aspirin EC 81 MG tablet Take 81 mg by mouth daily.  Marland Kitchen azelastine (ASTELIN) 0.1 % nasal spray USE 1 SPRAY INTO BOTH NOSTRILS TWICE DAILY.  . fish oil-omega-3 fatty acids 1000 MG capsule Take 2 g by mouth daily.  . folic acid (FOLVITE) 1 MG tablet TAKE 1 TABLET BY MOUTH  DAILY  . HYDROcodone-acetaminophen (NORCO/VICODIN) 5-325 MG tablet Take 1 tablet by mouth every 6 (six) hours as needed for moderate pain or severe pain (kidney stones).  . losartan-hydrochlorothiazide (HYZAAR) 50-12.5 MG tablet Take 1 tablet by mouth daily.  . Multiple Vitamin (MULTIVITAMIN WITH MINERALS) TABS tablet Take 1 tablet by mouth daily.  . RABEprazole (ACIPHEX) 20 MG tablet TAKE 1 TABLET BY MOUTH  DAILY  . rosuvastatin (CRESTOR) 10 MG tablet TAKE 1 TABLET BY MOUTH  DAILY  . traZODone (DESYREL) 50 MG tablet Take 1 tablet (50 mg total) by mouth at bedtime as needed for sleep.     Allergies:   Patient has no known allergies.   Social History   Socioeconomic History  . Marital status: Married    Spouse name: Not on file  . Number of children: Not on file  . Years of education: Not on file  .  Highest education level: Not on file  Occupational History  . Not on file  Tobacco Use  . Smoking status: Never Smoker  . Smokeless tobacco: Never Used  Substance and Sexual Activity  . Alcohol use: Yes    Comment: 3 per month  . Drug use: No  . Sexual activity: Yes  Other Topics Concern  . Not on file  Social History Narrative   Married 1990. 3 kids- 19-66 years old in 2021. Son in [place] now married working for [employer]. Daughter in [place]. Son Medical sales representative in 2020.    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       Hobbies: time with dog, outdoors person, Designer, jewellery   Social Determinants of Radio broadcast assistant Strain: Not on [employer] Insecurity: Not on file  Transportation Needs: Not on file  Physical Activity: Not on file  Stress: Not on file  Social Connections: Not on file    Social:  Paramedic for 73 years; runs ambulance service for the county  Family History: The patient's family history includes Crohn's disease in his daughter; Heart disease (age of onset: 53) in his father; Heart disease (age of onset: 78) in his sister; Lung cancer in his father; Stroke (age of onset: 36) in his brother; Stroke (age of onset: 49) in his paternal grandfather. History of coronary artery disease notable for father. History of peripheral arterial disease notable for grandfather. History of heart failure notable for no members. No history of cardiomyopathies including hypertrophic cardiomyopathy, left ventricular non-compaction, or arrhythmogenic right ventricular cardiomyopathy. History of arrhythmia notable for atrial fibrillation. Denies family history of sudden cardiac death including drowning, car accidents, or unexplained deaths in the family. Mother had aortic aneurysm. Brother was ICU doctors with a stroke.  ROS:   Please see the history of present illness.     All other systems reviewed and are negative.  EKGs/Labs/Other Studies Reviewed:    The following studies were reviewed today:  EKG:   07/14/2020: SR rate 86 iRBBB  Recent Labs: 07/02/2020: ALT 31; BUN 16; Creatinine, Ser 0.94; Hemoglobin 15.3; Platelets 216.0; Potassium 4.3; Sodium 140  Recent Lipid Panel    Component Value Date/Time   CHOL 108 07/02/2020 1146   TRIG 88.0 07/02/2020 1146   TRIG 70 05/08/2006 0949   HDL 32.40 (L) 07/02/2020 1146   CHOLHDL 3 07/02/2020 1146   VLDL 17.6 07/02/2020 1146   LDLCALC 58 07/02/2020 1146   LDLDIRECT 59.0 08/14/2019 0911    Risk Assessment/Calculations:      N/A  Physical Exam:    VS:  BP 120/76   Pulse 86   Ht 6\' 2"  (1.88 m)   Wt 281 lb 6.4 oz (127.6 kg)   SpO2 95%   BMI 36.13 kg/m     Wt Readings from Last 3 Encounters:  07/14/20 281 lb 6.4 oz (127.6 kg)  07/02/20 280 lb 12.8 oz (127.4 kg)  01/20/20 283 lb 9.6 oz (128.6 kg)     GEN: Obese, well developed in no acute distress HEENT: Normal NECK: No JVD; No carotid bruits LYMPHATICS: No lymphadenopathy CARDIAC: RRR, no murmurs, rubs, gallops RESPIRATORY:  Clear to auscultation without rales, wheezing or rhonchi  ABDOMEN: Soft, non-tender, non-distended MUSCULOSKELETAL:  No edema; No deformity  SKIN: Warm and dry NEUROLOGIC:  Alert and oriented  x 3 PSYCHIATRIC:  Normal affect   ASSESSMENT:    1. Family history of aortic aneurysm   2. Morbid obesity ([place])   3. Essential hypertension   4. Mixed hyperlipidemia    PLAN:    In order of problems listed above:  Family history of aortic aneurysm - will need CT Angio Aorta going to down to the abdominal aorta - Will get echocardiogram to look for AV and other surgical indications - Primary Prevention:  HTN, HLD as below  Essential Hypertension - ambulatory blood pressure at goal, will continue ambulatory BP monitoring; gave education on how to perform ambulatory blood pressure monitoring including the frequency and technique; goal ambulatory blood pressure < 135/85 on average - continue home medications - discussed diet (DASH/low sodium), and exercise/weight loss interventions  Hyperlipidemia (mixed) Morbid Obesity -LDL goal less than 100 - Fasting triglycerides notable for -continue current statin - gave education on dietary changes  Cardiac Preventive Visit AHA Life's Simple Seven- People with at least five ideal Life's Simple 7 metrics had a 78% reduced risk for heart-related death compared to people with no ideal metrics. - Discussed recommendation of Mediterranean and Dash Diets; discussed the evidence behind  vegan diets - Discussed recommendations for 150 minutes weekly exercise - Discussed the important of blood sugar and blood pressure control - Discussed aggressive cholesterol management - stress the important of a smoke free environment - though BMI is not a perfect measurement in all patient populations; a BMI < 30 can improve cardiac outcomes; present BMI is 36  Three months follow up unless new symptoms or abnormal test results warranting change in plan  Would be reasonable for  Video Visit Follow up Would be reasonable for  APP Follow up  Medication Adjustments/Labs and Tests Ordered: Current medicines are reviewed at length with the patient today.  Concerns regarding medicines are outlined above.  Orders Placed This Encounter  Procedures  . CT ANGIO CHEST AORTA W/CM & OR WO/CM  . EKG 12-Lead  . ECHOCARDIOGRAM COMPLETE   No orders of the defined types were placed in this encounter.   Patient Instructions  Medication Instructions:  Your physician recommends that you continue on your current medications as directed. Please refer to the Current Medication list given to you today.  *If you need a refill on your cardiac medications before your next appointment, please call your pharmacy*   Lab Work: NONE If you have labs (blood work) drawn today and your tests are completely normal, you will receive your results only by: Marland Kitchen MyChart Message (if you have MyChart) OR . A paper copy in the mail If you have any lab test that is abnormal or we need to change your treatment, we will call you to review the results.   Testing/Procedures: Non-Cardiac CT Angiography (CTA) of aorta, this is a special type of CT scan that uses a computer to produce multi-dimensional views of major blood vessels throughout the body. In CT angiography, a contrast material is injected through an IV to help visualize the blood vessels    Follow-Up: At [facility], you and your health needs are our  priority.  As part of our continuing mission to provide you with exceptional heart care, we have created designated Provider Care Teams.  These Care Teams include your primary Cardiologist (physician) and Advanced Practice Providers (APPs -  Physician Assistants and Nurse Practitioners) who all work together to provide you with the care you need, when you need it.  Your next appointment:   3 month(s)  The format for your next appointment:   In Person  Provider:   You may see Gasper Sells, MD or one of the following Advanced Practice Providers on your designated Care Team:    Melina Copa, PA-C  Ermalinda Barrios, PA-C         Signed, Werner Lean, MD  07/14/2020 4:46 PM    [place]

## 2020-07-20 ENCOUNTER — Telehealth: Payer: Self-pay

## 2020-07-20 DIAGNOSIS — Z8249 Family history of ischemic heart disease and other diseases of the circulatory system: Secondary | ICD-10-CM

## 2020-07-20 NOTE — Telephone Encounter (Signed)
BMP scheduled for 07/30/20 for upcoming CT angio of aorta on 08/10/20.

## 2020-07-30 ENCOUNTER — Other Ambulatory Visit: Payer: Self-pay

## 2020-07-30 ENCOUNTER — Other Ambulatory Visit: Payer: 59

## 2020-07-30 DIAGNOSIS — Z8249 Family history of ischemic heart disease and other diseases of the circulatory system: Secondary | ICD-10-CM

## 2020-07-30 LAB — BASIC METABOLIC PANEL
BUN/Creatinine Ratio: 20 (ref 9–20)
BUN: 20 mg/dL (ref 6–24)
CO2: 25 mmol/L (ref 20–29)
Calcium: 9.6 mg/dL (ref 8.7–10.2)
Chloride: 101 mmol/L (ref 96–106)
Creatinine, Ser: 1 mg/dL (ref 0.76–1.27)
GFR calc Af Amer: 98 mL/min/{1.73_m2} (ref >59)
GFR calc non Af Amer: 84 mL/min/{1.73_m2} (ref >59)
Glucose: 91 mg/dL (ref 65–99)
Potassium: 4.4 mmol/L (ref 3.5–5.2)
Sodium: 139 mmol/L (ref 134–144)

## 2020-08-10 ENCOUNTER — Ambulatory Visit (INDEPENDENT_AMBULATORY_CARE_PROVIDER_SITE_OTHER)
Admission: RE | Admit: 2020-08-10 | Discharge: 2020-08-10 | Disposition: A | Discharge status: Home / Self Care | Payer: 59 | Source: Ambulatory Visit | Attending: Internal Medicine | Admitting: Internal Medicine

## 2020-08-10 ENCOUNTER — Ambulatory Visit (HOSPITAL_COMMUNITY): Payer: 59 | Attending: Cardiology

## 2020-08-10 ENCOUNTER — Other Ambulatory Visit: Payer: Self-pay

## 2020-08-10 DIAGNOSIS — Z8249 Family history of ischemic heart disease and other diseases of the circulatory system: Secondary | ICD-10-CM | POA: Diagnosis present

## 2020-08-10 DIAGNOSIS — I10 Essential (primary) hypertension: Secondary | ICD-10-CM | POA: Diagnosis not present

## 2020-08-10 DIAGNOSIS — E785 Hyperlipidemia, unspecified: Secondary | ICD-10-CM | POA: Diagnosis not present

## 2020-08-10 DIAGNOSIS — Z8349 Family history of other endocrine, nutritional and metabolic diseases: Secondary | ICD-10-CM | POA: Insufficient documentation

## 2020-08-10 LAB — ECHOCARDIOGRAM COMPLETE
Area-P 1/2: 3.82 cm2
S' Lateral: 2.6 cm

## 2020-08-10 MED ORDER — IOHEXOL 350 MG/ML SOLN
100.0000 mL | Freq: Once | INTRAVENOUS | Status: AC | PRN
  Administered 2020-08-10: 100 mL via INTRAVENOUS

## 2020-08-13 ENCOUNTER — Telehealth: Payer: Self-pay

## 2020-08-13 DIAGNOSIS — I712 Thoracic aortic aneurysm, without rupture, unspecified: Secondary | ICD-10-CM

## 2020-08-13 NOTE — Telephone Encounter (Signed)
-----   Message from Werner Lean, MD sent at 08/11/2020  2:07 PM EST ----- Results: Mild Thoracic Aortic aneurysm Plan: 6 months repeat study (rate of growth assessment) Will check lipids and LFTs when we get labs prior to this test  Werner Lean, MD

## 2020-08-13 NOTE — Telephone Encounter (Signed)
Called patient notified him of Dr. Oralia Rud recommendations: CT Aorta in 6 months, LFT and lipid panel in 6 months.  He is agreeable to this plan orders placed.

## 2020-08-15 ENCOUNTER — Other Ambulatory Visit: Payer: Self-pay | Admitting: Family Medicine

## 2020-08-17 MED ORDER — AZELASTINE HCL 0.1 % NA SOLN: NASAL | 0 refills | Status: DC

## 2020-11-02 NOTE — Progress Notes (Signed)
Cardiology Office Note:    Date:  11/03/2020   ID:  Andrew Wilkins, DOB [birth year], MRN [id number]  PCP:  Andrew Olp, MD  [facility] Cardiologist:  Andrew Lean, MD  [place] Electrophysiologist:  None   CC: HLD and AA follow up.   History of Present Illness:    Andrew Wilkins is a 55 y.o. male with a hx of Morbid Obesity, HTN, HLD, Family history of PAD who presents for evaluation 07/14/20. In interim of this visit, patient had CCTA showing mild TAA (41 mm), a punctate 3 mm non-calcified nodules, and possible hepatic steatosis. In interim of this visit, patient had Echo and CT- seen 11/03/20.  Patient notes that he is doing pretty good.  Since day last visit notes Covid-19 3/22; no present sx. There are no interval hospital/ED visit.    Did 6.5 mile hike 5/30 without issues. No chest pain or pressure.  No SOB/DOE unless going up incline and no PND/Orthopnea.  No weight gain or leg swelling.  No palpitations or syncope . No fevers, chills, or night sweats.  Ambulatory blood pressure 120/60.   Past Medical History:  Diagnosis Date  . Arthritis   . BPH (benign prostatic hyperplasia)   . GERD (gastroesophageal reflux disease)   . Hyperlipidemia   . Hypertension   . Kidney stone   . Plantar fasciitis    cortisone injection    Past Surgical History:  Procedure Laterality Date  . BILATERAL ANTERIOR TOTAL HIP ARTHROPLASTY Bilateral 12/04/2015   Procedure: BILATERAL ANTERIOR APPROACH TOTAL HIP ARTHROPLASTY;  Surgeon: Andrew Rossetti, MD;  Location: [facility];  Service: Orthopedics;  Laterality: Bilateral;  . left eye surgery     left eye surgery-squint surgery-age 89  . none    . TONSILLECTOMY     age 54    Current Medications: Current Meds  Medication Sig  . acetaminophen (TYLENOL) 500 MG tablet Take 1,000 mg by mouth daily as needed.   Marland Kitchen alfuzosin (UROXATRAL) 10 MG 24 hr tablet Take 10 mg by mouth daily with breakfast.  . aspirin EC 81 MG  tablet Take 81 mg by mouth daily.  Marland Kitchen azelastine (ASTELIN) 0.1 % nasal spray Use in each nostril as directed  . fish oil-omega-3 fatty acids 1000 MG capsule Take 2 g by mouth daily.  . folic acid (FOLVITE) 1 MG tablet TAKE 1 TABLET BY MOUTH  DAILY  . HYDROcodone-acetaminophen (NORCO/VICODIN) 5-325 MG tablet Take 1 tablet by mouth every 6 (six) hours as needed for moderate pain or severe pain (kidney stones).  . losartan-hydrochlorothiazide (HYZAAR) 50-12.5 MG tablet Take 1 tablet by mouth daily.  . Multiple Vitamin (MULTIVITAMIN WITH MINERALS) TABS tablet Take 1 tablet by mouth daily.  . RABEprazole (ACIPHEX) 20 MG tablet TAKE 1 TABLET BY MOUTH  DAILY  . rosuvastatin (CRESTOR) 10 MG tablet TAKE 1 TABLET BY MOUTH  DAILY  . traZODone (DESYREL) 50 MG tablet Take 1 tablet (50 mg total) by mouth at bedtime as needed for sleep.     Allergies:   Patient has no known allergies.   Social History   Socioeconomic History  . Marital status: Married    Spouse name: Not on file  . Number of children: Not on file  . Years of education: Not on file  . Highest education level: Not on file  Occupational History  . Not on file  Tobacco Use  . Smoking status: Never Smoker  . Smokeless tobacco: Never Used  Substance  and Sexual Activity  . Alcohol use: Yes    Comment: 3 per month  . Drug use: No  . Sexual activity: Yes  Other Topics Concern  . Not on file  Social History Narrative   Married 1990. 3 kids- 49-21 years old in 2021. Son in [place] now married working for [employer]. Daughter in [place]. Son Medical sales representative in 2020.    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      Hobbies: time with dog, outdoors person, Designer, jewellery   Social Determinants of Radio broadcast assistant Strain: Not on [employer] Insecurity: Not on file  Transportation Needs: Not  on file  Physical Activity: Not on file  Stress: Not on file  Social Connections: Not on file    Social:  Paramedic for 67 years; runs ambulance service for the county  Family History: The patient's family history includes Crohn's disease in his daughter; Heart disease (age of onset: 19) in his father; Heart disease (age of onset: 52) in his sister; Lung cancer in his father; Stroke (age of onset: 25) in his brother; Stroke (age of onset: 42) in his paternal grandfather. History of coronary artery disease notable for father. History of peripheral arterial disease notable for grandfather. History of heart failure notable for no members. No history of cardiomyopathies including hypertrophic cardiomyopathy, left ventricular non-compaction, or arrhythmogenic right ventricular cardiomyopathy. History of arrhythmia notable for atrial fibrillation. Denies family history of sudden cardiac death including drowning, car accidents, or unexplained deaths in the family. Mother had aortic aneurysm. Brother was ICU doctors with a stroke.  ROS:   Please see the history of present illness.     All other systems reviewed and are negative.  EKGs/Labs/Other Studies Reviewed:    The following studies were reviewed today:  EKG:   07/14/2020: SR rate 86 iRBBB  Transthoracic Echocardiogram: Date: 08/10/20 Results: 1. Left ventricular ejection fraction, by estimation, is 55 to 60%. The  left ventricle has normal function. The left ventricle has no regional  wall motion abnormalities. Left ventricular diastolic parameters were  normal.  2. Right ventricular systolic function is normal. The right ventricular  size is mildly enlarged. There is mildly elevated pulmonary artery  systolic pressure. The estimated right ventricular systolic pressure is  52.8 mmHg.  3. Left atrial size was mildly dilated.  4. Right atrial size was mildly dilated.  5. The mitral valve is normal in structure. Trivial mitral  valve  regurgitation. No evidence of mitral stenosis.  6. The aortic valve is tricuspid. Aortic valve regurgitation is not  visualized. No aortic stenosis is present.  7. The inferior vena cava is dilated in size with >50% respiratory  variability, suggesting right atrial pressure of 8 mmHg.  8. Aortic dilatation noted. There is moderate dilatation of the ascending  aorta, measuring 46 mm. Consider MRA or CTA of chest for further  evaluation.   CT Aorta: Date: 08/10/20 Results: IMPRESSION: 1. Mild uncomplicated fusiform aneurysmal dilatation of the ascending thoracic aorta measuring 41 mm in diameter. Recommend annual imaging followup by CTA. This recommendation follows 2010 ACCF/AHA/AATS/ACR/ASA/SCA/SCAI/SIR/STS/SVM Guidelines for the Diagnosis and Management of Patients with Thoracic Aortic Disease. Circulation. 2010; 121: U132-G401. Aortic aneurysm NOS (ICD10-I71.9) 2. Punctate (3 mm) noncalcified nodules within the bilateral lower lobes. Attention on the above recommended annual CTA  is advised. 3. Suspected hepatic steatosis. Correlation with LFTs is advised.  Recent Labs: 07/02/2020: ALT 31; Hemoglobin 15.3; Platelets 216.0 07/30/2020: BUN 20; Creatinine, Ser 1.00; Potassium 4.4; Sodium 139  Recent Lipid Panel    Component Value Date/Time   CHOL 108 07/02/2020 1146   TRIG 88.0 07/02/2020 1146   TRIG 70 05/08/2006 0949   HDL 32.40 (L) 07/02/2020 1146   CHOLHDL 3 07/02/2020 1146   VLDL 17.6 07/02/2020 1146   LDLCALC 58 07/02/2020 1146   LDLDIRECT 59.0 08/14/2019 0911    Risk Assessment/Calculations:     N/A  Physical Exam:    VS:  BP 122/68   Pulse (!) 57   Ht 6\' 2"  (1.88 m)   Wt 127.9 kg   SpO2 97%   BMI 36.21 kg/m     Wt Readings from Last 3 Encounters:  11/03/20 127.9 kg  07/14/20 127.6 kg  07/02/20 127.4 kg    GEN: Obese, well developed in no acute distress HEENT: Bilateral Frank's Sign NECK: No JVD; No carotid bruits LYMPHATICS: No  lymphadenopathy CARDIAC: Regular bradycardia, no murmurs, rubs, gallops RESPIRATORY:  Clear to auscultation without rales, wheezing or rhonchi  ABDOMEN: Soft, non-tender, non-distended MUSCULOSKELETAL:  No edema; No deformity  SKIN: Warm and dry NEUROLOGIC:  Alert and oriented x 3 PSYCHIATRIC:  Normal affect   ASSESSMENT:    1. Thoracic aortic aneurysm without rupture ([place])   2. Morbid obesity ([place])   3. Essential hypertension   4. Hepatic steatosis   5. Pulmonary nodules    PLAN:    In order of problems listed above:  Thoracic Aortic Aneuyrsym without rupture- last 41 mm WNL for BSA 2.58 HTN HLD with hepatic steatosis Morbid Obesity Pulmonary nodules - will get yearly CT Aorta, next March 2023- if change in pulm nodules or new sx will refer to pulm nodule clinic - LDL goal of 70; continue rosuvastatin 10 mg PO Daily - ASA is reasonable - fish-oil supplement is reasonable for TGs - continue losartan-HCTZ 50-12 - amb BP is at goal - 1st degree relative one time screening  - Discussed not using Fluoroquinolones (allergy not added) - Discussed exercise regimens (goal is activity first, rather than purely a weight loss focus)  Late March or April 2023 follow up unless new symptoms or abnormal test results warranting change in plan  Would be reasonable for  APP Follow up    Medication Adjustments/Labs and Tests Ordered: Current medicines are reviewed at length with the patient today.  Concerns regarding medicines are outlined above.  No orders of the defined types were placed in this encounter.  No orders of the defined types were placed in this encounter.   Patient Instructions  Medication Instructions:  Your physician recommends that you continue on your current medications as directed. Please refer to the Current Medication list given to you today. *If you need a refill on your cardiac medications before your next appointment, please call your pharmacy*   Lab  Work: NONE If you have labs (blood work) drawn today and your tests are completely normal, you will receive your results only by: Marland Kitchen MyChart Message (if you have MyChart) OR . A paper copy in the mail If you have any lab test that is abnormal or we need to change your treatment, we will call you to review the results.   Testing/Procedures: Your physician has requested that you have a CT of your Aorta in March of 2023.     Follow-Up: At [facility],  you and your health needs are our priority.  As part of our continuing mission to provide you with exceptional heart care, we have created designated Provider Care Teams.  These Care Teams include your primary Cardiologist (physician) and Advanced Practice Providers (APPs -  Physician Assistants and Nurse Practitioners) who all work together to provide you with the care you need, when you need it.  We recommend signing up for the patient portal called "MyChart".  Sign up information is provided on this After Visit Summary.  MyChart is used to connect with patients for Virtual Visits (Telemedicine).  Patients are able to view lab/test results, encounter notes, upcoming appointments, etc.  Non-urgent messages can be sent to your provider as well.   To learn more about what you can do with MyChart, go to [email or web address].    Your next appointment:   Late March early April 2023  The format for your next appointment:   In Person  Provider:   You may see Andrew Lean, MD or one of the following Advanced Practice Providers on your designated Care Team:    Melina Copa, PA-C  Ermalinda Barrios, PA-C    Other Instructions You do not need CT aorta scheduled for Sept. 2022     Signed, Andrew Lean, MD  11/03/2020 8:36 AM    [place]

## 2020-11-03 ENCOUNTER — Ambulatory Visit: Payer: 59 | Admitting: Internal Medicine

## 2020-11-03 ENCOUNTER — Other Ambulatory Visit: Payer: Self-pay

## 2020-11-03 ENCOUNTER — Encounter: Payer: Self-pay | Admitting: Internal Medicine

## 2020-11-03 VITALS — BP 122/68 | HR 57 | Ht 74.0 in | Wt 282.0 lb

## 2020-11-03 DIAGNOSIS — K760 Fatty (change of) liver, not elsewhere classified: Secondary | ICD-10-CM

## 2020-11-03 DIAGNOSIS — I712 Thoracic aortic aneurysm, without rupture, unspecified: Secondary | ICD-10-CM | POA: Insufficient documentation

## 2020-11-03 DIAGNOSIS — I10 Essential (primary) hypertension: Secondary | ICD-10-CM

## 2020-11-03 DIAGNOSIS — R918 Other nonspecific abnormal finding of lung field: Secondary | ICD-10-CM

## 2020-11-03 NOTE — Patient Instructions (Signed)
Medication Instructions:  Your physician recommends that you continue on your current medications as directed. Please refer to the Current Medication list given to you today. *If you need a refill on your cardiac medications before your next appointment, please call your pharmacy*   Lab Work: NONE If you have labs (blood work) drawn today and your tests are completely normal, you will receive your results only by: Marland Kitchen MyChart Message (if you have MyChart) OR . A paper copy in the mail If you have any lab test that is abnormal or we need to change your treatment, we will call you to review the results.   Testing/Procedures: Your physician has requested that you have a CT of your Aorta in March of 2023.     Follow-Up: At [facility], you and your health needs are our priority.  As part of our continuing mission to provide you with exceptional heart care, we have created designated Provider Care Teams.  These Care Teams include your primary Cardiologist (physician) and Advanced Practice Providers (APPs -  Physician Assistants and Nurse Practitioners) who all work together to provide you with the care you need, when you need it.  We recommend signing up for the patient portal called "MyChart".  Sign up information is provided on this After Visit Summary.  MyChart is used to connect with patients for Virtual Visits (Telemedicine).  Patients are able to view lab/test results, encounter notes, upcoming appointments, etc.  Non-urgent messages can be sent to your provider as well.   To learn more about what you can do with MyChart, go to [email or web address].    Your next appointment:   Late March early April 2023  The format for your next appointment:   In Person  Provider:   You may see Werner Lean, MD or one of the following Advanced Practice Providers on your designated Care Team:    Melina Copa, PA-C  Ermalinda Barrios, PA-C    Other Instructions You do not need CT aorta  scheduled for Sept. 2022

## 2020-12-27 ENCOUNTER — Other Ambulatory Visit: Payer: Self-pay | Admitting: Urology

## 2020-12-30 NOTE — Telephone Encounter (Signed)
Called pt to offer an August follow up appointment.  Left a message for pt to call back.

## 2021-01-04 ENCOUNTER — Encounter: Payer: Self-pay | Admitting: Family Medicine

## 2021-02-19 ENCOUNTER — Other Ambulatory Visit: Payer: 59

## 2021-03-05 ENCOUNTER — Ambulatory Visit (INDEPENDENT_AMBULATORY_CARE_PROVIDER_SITE_OTHER): Payer: 59

## 2021-03-05 ENCOUNTER — Other Ambulatory Visit: Payer: Self-pay

## 2021-03-05 ENCOUNTER — Encounter: Payer: Self-pay | Admitting: Internal Medicine

## 2021-03-05 ENCOUNTER — Ambulatory Visit: Payer: 59 | Admitting: Internal Medicine

## 2021-03-05 VITALS — BP 134/72 | HR 70 | Ht 74.0 in | Wt 279.2 lb

## 2021-03-05 DIAGNOSIS — E782 Mixed hyperlipidemia: Secondary | ICD-10-CM | POA: Diagnosis not present

## 2021-03-05 DIAGNOSIS — I712 Thoracic aortic aneurysm, without rupture, unspecified: Secondary | ICD-10-CM

## 2021-03-05 DIAGNOSIS — R002 Palpitations: Secondary | ICD-10-CM

## 2021-03-05 DIAGNOSIS — I10 Essential (primary) hypertension: Secondary | ICD-10-CM | POA: Diagnosis not present

## 2021-03-05 DIAGNOSIS — R918 Other nonspecific abnormal finding of lung field: Secondary | ICD-10-CM

## 2021-03-05 NOTE — Progress Notes (Signed)
Cardiology Office Note:    Date:  03/05/2021   ID:  Andrew Wilkins, DOB 11/04/1965, MRN [id number]  PCP:  Marin Olp, MD  [facility] Cardiologist:  Werner Lean, MD  [place] Electrophysiologist:  None   CC: Follow up palpitations   History of Present Illness:    Andrew Wilkins is a 55 y.o. male with a hx of Morbid Obesity, HTN, HLD, Family history of PAD who presents for evaluation 07/14/20. In interim of this visit, patient had CCTA showing mild TAA (41 mm), a punctate 3 mm non-calcified nodules, and possible hepatic steatosis. In interim of this visit, patient had Echo and CT- seen 11/03/20. Has some palpitations around the passing of his father in law and his brother (who was an ICU doctor).  Works with stress pretty frequency but notes the palpitations were more frequent.  Seen 03/05/21.  Patient notes that he is doing well.  Since last visit notes new palpitations that . There are no interval hospital/ED visit.    No chest pain or pressure .  No SOB/DOE and no PND/Orthopnea.  No weight gain or leg swelling.  No palpitations or syncope since calling back in with the concern (12/28/20).  Notes that he also has some PSA elevation and is getting a biopsy of his prostate.    Past Medical History:  Diagnosis Date   Arthritis    BPH (benign prostatic hyperplasia)    GERD (gastroesophageal reflux disease)    Hyperlipidemia    Hypertension    Kidney stone    Plantar fasciitis    cortisone injection    Past Surgical History:  Procedure Laterality Date   BILATERAL ANTERIOR TOTAL HIP ARTHROPLASTY Bilateral 12/04/2015   Procedure: BILATERAL ANTERIOR APPROACH TOTAL HIP ARTHROPLASTY;  Surgeon: Mcarthur Rossetti, MD;  Location: [facility];  Service: Orthopedics;  Laterality: Bilateral;   left eye surgery     left eye surgery-squint surgery-age 76   none     TONSILLECTOMY     age 16    Current Medications: Current Meds  Medication Sig   acetaminophen  (TYLENOL) 500 MG tablet Take 1,000 mg by mouth daily as needed.    alfuzosin (UROXATRAL) 10 MG 24 hr tablet Take 10 mg by mouth daily with breakfast.   aspirin EC 81 MG tablet Take 81 mg by mouth daily.   azelastine (ASTELIN) 0.1 % nasal spray Use in each nostril as directed   fish oil-omega-3 fatty acids 1000 MG capsule Take 2 g by mouth daily.   folic acid (FOLVITE) 1 MG tablet TAKE 1 TABLET BY MOUTH  DAILY   HYDROcodone-acetaminophen (NORCO/VICODIN) 5-325 MG tablet Take 1 tablet by mouth every 6 (six) hours as needed for moderate pain or severe pain (kidney stones).   losartan-hydrochlorothiazide (HYZAAR) 50-12.5 MG tablet Take 1 tablet by mouth daily.   Multiple Vitamin (MULTIVITAMIN WITH MINERALS) TABS tablet Take 1 tablet by mouth daily.   RABEprazole (ACIPHEX) 20 MG tablet TAKE 1 TABLET BY MOUTH  DAILY   rosuvastatin (CRESTOR) 10 MG tablet TAKE 1 TABLET BY MOUTH  DAILY   traZODone (DESYREL) 50 MG tablet Take 1 tablet (50 mg total) by mouth at bedtime as needed for sleep.     Allergies:   Patient has no known allergies.   Social History   Socioeconomic History   Marital status: Married    Spouse name: Not on file   Number of children: Not on file   Years of education: Not on  file   Highest education level: Not on file  Occupational History   Not on file  Tobacco Use   Smoking status: Never   Smokeless tobacco: Never  Substance and Sexual Activity   Alcohol use: Yes    Comment: 3 per month   Drug use: No   Sexual activity: Yes  Other Topics Concern   Not on file  Social History Narrative   Married 1990. 3 kids- 62-38 years old in 2021. Son in [place] now married working for [employer]. Daughter in [employer]. Son Medical sales representative in 2020.    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  Family History: The patient's family history includes Crohn's disease in his daughter; Heart disease (age of onset: 91) in his father; Heart disease (age of onset: 63) in his sister; Lung cancer in his father; Stroke (age of onset: 50) in his brother; Stroke (age of onset: 20) in his paternal grandfather. History of coronary artery disease notable for father. History of peripheral arterial disease notable for grandfather. History of heart failure notable for no members. No history of cardiomyopathies including hypertrophic cardiomyopathy, left ventricular non-compaction, or arrhythmogenic right ventricular cardiomyopathy. History of arrhythmia notable for atrial fibrillation. Denies family history of sudden cardiac death including drowning, car accidents, or unexplained deaths in the family. Mother had aortic aneurysm. Brother was ICU doctors with a stroke.  ROS:   Please see the history of present illness.     All other systems reviewed and are negative.  EKGs/Labs/Other Studies Reviewed:    The following studies were reviewed today:  EKG:   07/14/2020: SR rate 86 iRBBB  Transthoracic Echocardiogram: Date: 08/10/20 Results: 1. Left ventricular ejection fraction, by estimation, is 55 to 60%. The  left ventricle has normal function. The left ventricle has no regional  wall motion abnormalities. Left ventricular diastolic parameters were  normal.   2. Right ventricular systolic function is normal. The right ventricular  size is mildly enlarged. There is mildly elevated pulmonary artery  systolic  pressure. The estimated right ventricular systolic pressure is  76.1 mmHg.   3. Left atrial size was mildly dilated.   4. Right atrial size was mildly dilated.   5. The mitral valve is normal in structure. Trivial mitral valve  regurgitation. No evidence of mitral stenosis.   6. The aortic valve is tricuspid. Aortic valve regurgitation is not  visualized. No aortic stenosis is present.   7. The inferior vena cava is dilated in size with >50% respiratory  variability, suggesting right atrial pressure of 8 mmHg.   8. Aortic dilatation noted. There is moderate dilatation of the ascending  aorta, measuring 46 mm. Consider MRA or CTA of chest for further  evaluation.   CT Aorta: Date: 08/10/20 Results: IMPRESSION: 1. Mild uncomplicated fusiform aneurysmal dilatation of the ascending thoracic  aorta measuring 41 mm in diameter. Recommend annual imaging followup by CTA. This recommendation follows 2010 ACCF/AHA/AATS/ACR/ASA/SCA/SCAI/SIR/STS/SVM Guidelines for the Diagnosis and Management of Patients with Thoracic Aortic Disease. Circulation. 2010; 121: K800-L491. Aortic aneurysm NOS (ICD10-I71.9) 2. Punctate (3 mm) noncalcified nodules within the bilateral lower lobes. Attention on the above recommended annual CTA is advised. 3. Suspected hepatic steatosis. Correlation with LFTs is advised.  Recent Labs: 07/02/2020: ALT 31; Hemoglobin 15.3; Platelets 216.0 07/30/2020: BUN 20; Creatinine, Ser 1.00; Potassium 4.4; Sodium 139  Recent Lipid Panel    Component Value Date/Time   CHOL 108 07/02/2020 1146   TRIG 88.0 07/02/2020 1146   TRIG 70 05/08/2006 0949   HDL 32.40 (L) 07/02/2020 1146   CHOLHDL 3 07/02/2020 1146   VLDL 17.6 07/02/2020 1146   LDLCALC 58 07/02/2020 1146   LDLDIRECT 59.0 08/14/2019 0911    Risk Assessment/Calculations:     N/A  Physical Exam:    VS:  BP 134/72   Pulse 70   Ht 6\' 2"  (1.88 m)   Wt 279 lb 3.2 oz (126.6 kg)   SpO2 98%   BMI 35.85 kg/m     Wt  Readings from Last 3 Encounters:  03/05/21 279 lb 3.2 oz (126.6 kg)  11/03/20 282 lb (127.9 kg)  07/14/20 281 lb 6.4 oz (127.6 kg)    GEN: Obese, well developed in no acute distress HEENT: Bilateral Frank's Sign NECK: No JVD LYMPHATICS: No lymphadenopathy CARDIAC: Regular rhythm, no murmurs, rubs, gallops RESPIRATORY:  Clear to auscultation without rales, wheezing or rhonchi  ABDOMEN: Soft, non-tender, non-distended MUSCULOSKELETAL:  No edema; No deformity  SKIN: Warm and dry NEUROLOGIC:  Alert and oriented x 3 PSYCHIATRIC:  Normal affect   ASSESSMENT:    1. Palpitations   2. Thoracic aortic aneurysm without rupture (Andrew Wilkins)   3. Essential hypertension   4. Mixed hyperlipidemia   5. Morbid obesity (Andrew Wilkins)   6. Pulmonary nodules     PLAN:    In order of problems listed above:  New palpitations - given his family history of stroke; will place 2 week non live monitor for assessment of AF or SVT  Thoracic Aortic Aneuyrsym without rupture- last 41 mm WNL for BSA 2.58 HTN HLD with hepatic steatosis Morbid Obesity Pulmonary nodules - will get yearly CT Aorta, next March 2023- if change in pulm nodules or new sx will refer to pulm nodule clinic - LDL goal of 70; continue rosuvastatin 10 mg PO Daily - ASA 81 mg PO daily - fish-oil supplement is reasonable for TGs - continue losartan-HCTZ 50-12 - amb BP is at goal  Late March or April 2023 follow up unless new symptoms or abnormal test results warranting change in plan  Would be reasonable for  APP Follow up    Medication Adjustments/Labs and Tests Ordered: Current medicines are reviewed at length with the patient today.  Concerns regarding medicines are outlined above.  Orders Placed This Encounter  Procedures   LONG TERM MONITOR (3-14 DAYS)    No orders of the defined types were placed in this encounter.   Patient Instructions  Medication Instructions:  Your physician recommends that you continue on your current  medications as directed. Please refer to the Current Medication list given to you today.  *If you need a refill on your cardiac medications before your next appointment, please call your pharmacy*   Lab Work: NONE If you have labs (blood work) drawn today and your tests are completely normal, you will receive your  results only by: MyChart Message (if you have MyChart) OR A paper copy in the mail If you have any lab test that is abnormal or we need to change your treatment, we will call you to review the results.   Testing/Procedures: Your physician has requested that you wear a heart monitor.    Follow-Up: At [facility], you and your health needs are our priority.  As part of our continuing mission to provide you with exceptional heart care, we have created designated Provider Care Teams.  These Care Teams include your primary Cardiologist (physician) and Advanced Practice Providers (APPs -  Physician Assistants and Nurse Practitioners) who all work together to provide you with the care you need, when you need it.   Your next appointment:   6 -7 month(s)  The format for your next appointment:   In Person  Provider:   You may see Werner Lean, MD or one of the following Advanced Practice Providers on your designated Care Team:   Melina Copa, PA-C Ermalinda Barrios, PA-C   Other Instructions  Bryn Gulling- Long Term Monitor Instructions  Your physician has requested you wear a ZIO patch monitor for 14 days.  This is a single patch monitor. [employer] supplies one patch monitor per enrollment. Additional stickers are not available. Please do not apply patch if you will be having a Nuclear Stress Test,  Echocardiogram, Cardiac CT, MRI, or Chest Xray during the period you would be wearing the  monitor. The patch cannot be worn during these tests. You cannot remove and re-apply the  ZIO XT patch monitor.  Your ZIO patch monitor will be mailed 3 day USPS to your address on file.  It may take 3-5 days  to receive your monitor after you have been enrolled.  Once you have received your monitor, please review the enclosed instructions. Your monitor  has already been registered assigning a specific monitor serial # to you.  Billing and Patient Assistance Program Information  We have supplied [employer] with any of your insurance information on file for billing purposes. [employer] offers a sliding scale Patient Assistance Program for patients that do not have  insurance, or whose insurance does not completely cover the cost of the ZIO monitor.  You must apply for the Patient Assistance Program to qualify for this discounted rate.  To apply, please call [employer] at [phone], select option 4, select option 2, ask to apply for  Patient Assistance Program. Theodore Demark will ask your household income, and how many people  are in your household. They will quote your out-of-pocket cost based on that information.  [employer] will also be able to set up a 8-month, interest-free payment plan if needed.  Applying the monitor   Shave hair from upper left chest.  Hold abrader disc by orange tab. Rub abrader in 40 strokes over the upper left chest as  indicated in your monitor instructions.  Clean area with 4 enclosed alcohol pads. Let dry.  Apply patch as indicated in monitor instructions. Patch will be placed under collarbone on left  side of chest with arrow pointing upward.  Rub patch adhesive wings for 2 minutes. Remove white label marked "1". Remove the white  label marked "2". Rub patch adhesive wings for 2 additional minutes.  While looking in a mirror, press and release button in center of patch. A small green light will  flash 3-4 times. This will be your only indicator that the monitor has been turned on.  Do not  shower for the first 24 hours. You may shower after the first 24 hours.  Press the button if you feel a symptom. You will hear a small click. Record Date, Time and   Symptom in the Patient Logbook.  When you are ready to remove the patch, follow instructions on the last 2 pages of Patient  Logbook. Stick patch monitor onto the last page of Patient Logbook.  Place Patient Logbook in the blue and white box. Use locking tab on box and tape box closed  securely. The blue and white box has prepaid postage on it. Please place it in the mailbox as  soon as possible. Your physician should have your test results approximately 7 days after the  monitor has been mailed back to [facility].  Call [place] at [phone] if you have questions regarding  your ZIO XT patch monitor. Call them immediately if you see an orange light blinking on your  monitor.  If your monitor falls off in less than 4 days, contact our Monitor department at [phone].  If your monitor becomes loose or falls off after 4 days call [employer] at [phone] for  suggestions on securing your monitor     Signed, Werner Lean, MD  03/05/2021 8:19 AM    [place]

## 2021-03-05 NOTE — Patient Instructions (Addendum)
Medication Instructions:  Your physician recommends that you continue on your current medications as directed. Please refer to the Current Medication list given to you today.  *If you need a refill on your cardiac medications before your next appointment, please call your pharmacy*   Lab Work: NONE If you have labs (blood work) drawn today and your tests are completely normal, you will receive your results only by: [place] (if you have MyChart) OR A paper copy in the mail If you have any lab test that is abnormal or we need to change your treatment, we will call you to review the results.   Testing/Procedures: Your physician has requested that you wear a heart monitor.    Follow-Up: At [facility], you and your health needs are our priority.  As part of our continuing mission to provide you with exceptional heart care, we have created designated Provider Care Teams.  These Care Teams include your primary Cardiologist (physician) and Advanced Practice Providers (APPs -  Physician Assistants and Nurse Practitioners) who all work together to provide you with the care you need, when you need it.   Your next appointment:   6 -7 month(s)  The format for your next appointment:   In Person  Provider:   You may see Werner Lean, MD or one of the following Advanced Practice Providers on your designated Care Team:   Melina Copa, PA-C Ermalinda Barrios, PA-C   Other Instructions  Bryn Gulling- Long Term Monitor Instructions  Your physician has requested you wear a ZIO patch monitor for 14 days.  This is a single patch monitor. [employer] supplies one patch monitor per enrollment. Additional stickers are not available. Please do not apply patch if you will be having a Nuclear Stress Test,  Echocardiogram, Cardiac CT, MRI, or Chest Xray during the period you would be wearing the  monitor. The patch cannot be worn during these tests. You cannot remove and re-apply the  ZIO XT patch  monitor.  Your ZIO patch monitor will be mailed 3 day USPS to your address on file. It may take 3-5 days  to receive your monitor after you have been enrolled.  Once you have received your monitor, please review the enclosed instructions. Your monitor  has already been registered assigning a specific monitor serial # to you.  Billing and Patient Assistance Program Information  We have supplied [employer] with any of your insurance information on file for billing purposes. [employer] offers a sliding scale Patient Assistance Program for patients that do not have  insurance, or whose insurance does not completely cover the cost of the ZIO monitor.  You must apply for the Patient Assistance Program to qualify for this discounted rate.  To apply, please call [employer] at [phone], select option 4, select option 2, ask to apply for  Patient Assistance Program. Theodore Demark will ask your household income, and how many people  are in your household. They will quote your out-of-pocket cost based on that information.  [employer] will also be able to set up a 25-month, interest-free payment plan if needed.  Applying the monitor   Shave hair from upper left chest.  Hold abrader disc by orange tab. Rub abrader in 40 strokes over the upper left chest as  indicated in your monitor instructions.  Clean area with 4 enclosed alcohol pads. Let dry.  Apply patch as indicated in monitor instructions. Patch will be placed under collarbone on left  side of chest with arrow pointing upward.  Rub patch adhesive wings for 2 minutes. Remove white label marked "1". Remove the white  label marked "2". Rub patch adhesive wings for 2 additional minutes.  While looking in a mirror, press and release button in center of patch. A small green light will  flash 3-4 times. This will be your only indicator that the monitor has been turned on.  Do not shower for the first 24 hours. You may shower after the first 24 hours.  Press the  button if you feel a symptom. You will hear a small click. Record Date, Time and  Symptom in the Patient Logbook.  When you are ready to remove the patch, follow instructions on the last 2 pages of Patient  Logbook. Stick patch monitor onto the last page of Patient Logbook.  Place Patient Logbook in the blue and white box. Use locking tab on box and tape box closed  securely. The blue and white box has prepaid postage on it. Please place it in the mailbox as  soon as possible. Your physician should have your test results approximately 7 days after the  monitor has been mailed back to [facility].  Call [place] at [phone] if you have questions regarding  your ZIO XT patch monitor. Call them immediately if you see an orange light blinking on your  monitor.  If your monitor falls off in less than 4 days, contact our Monitor department at [phone].  If your monitor becomes loose or falls off after 4 days call [employer] at [phone] for  suggestions on securing your monitor

## 2021-03-05 NOTE — Progress Notes (Unsigned)
Patient enrolled for [employer] to mail a 14 day ZIO XT monitor to his address on file.

## 2021-03-08 ENCOUNTER — Ambulatory Visit: Payer: 59

## 2021-03-22 DIAGNOSIS — R002 Palpitations: Secondary | ICD-10-CM

## 2021-04-01 ENCOUNTER — Other Ambulatory Visit (HOSPITAL_BASED_OUTPATIENT_CLINIC_OR_DEPARTMENT_OTHER): Payer: Self-pay

## 2021-04-01 ENCOUNTER — Ambulatory Visit: Payer: 59 | Attending: Internal Medicine

## 2021-04-01 DIAGNOSIS — Z23 Encounter for immunization: Secondary | ICD-10-CM

## 2021-04-01 MED ORDER — MODERNA COVID-19 BIVAL BOOSTER 50 MCG/0.5ML IM SUSP
INTRAMUSCULAR | 0 refills | Status: DC
  Filled 2021-04-01: qty 0.5, 1d supply, fill #0

## 2021-04-01 NOTE — Progress Notes (Signed)
   Covid-19 Vaccination Clinic  Name:  LEELYN JASINSKI    MRN: [id number] DOB: [birth year]  04/01/2021  Mr. Bihl was observed post Covid-19 immunization for 15 minutes without incident. He was provided with Vaccine Information Sheet and instruction to access the V-Safe system.   Mr. Hobbs was instructed to call 911 with any severe reactions post vaccine: Difficulty breathing  Swelling of face and throat  A fast heartbeat  A bad rash all over body  Dizziness and weakness   Immunizations Administered     Name Date Dose VIS Date Route   Moderna Covid-19 vaccine Bivalent Booster 04/01/2021  9:29 AM 0.5 mL 01/16/2021 Intramuscular   Manufacturer: Moderna   Lot: [id number]   [place]: [id number]

## 2021-04-15 ENCOUNTER — Telehealth: Payer: Self-pay | Admitting: Internal Medicine

## 2021-04-15 ENCOUNTER — Telehealth: Payer: Self-pay

## 2021-04-15 NOTE — Telephone Encounter (Signed)
Forms have been faxed to employer.

## 2021-04-15 NOTE — Telephone Encounter (Signed)
Follow Up:     Patients says he is returning Shamea's call from 04-13-21. He thinks it might be about his test results.

## 2021-04-15 NOTE — Telephone Encounter (Signed)
Received FMLA paperwork for patient, placed it upfront in folders.

## 2021-04-15 NOTE — Telephone Encounter (Signed)
Please see result note 

## 2021-06-15 ENCOUNTER — Other Ambulatory Visit: Payer: Self-pay | Admitting: Family Medicine

## 2021-07-05 NOTE — Progress Notes (Addendum)
Phone: [phone]   Subjective:  Patient presents today for their annual physical. Chief complaint-noted.   See problem oriented charting- ROS- full  review of systems was completed and negative  except for: still with sleep issues- wakes up with nocturia and hard to go back to sleep, left posterior heel pain with activity- plans to follow up with orthopedics , fatigue, fever, congestion, stable tinnitus, BPH with difficulty urinating  The following were reviewed and entered/updated in epic: Past Medical History:  Diagnosis Date   Arthritis    BPH (benign prostatic hyperplasia)    GERD (gastroesophageal reflux disease)    Hyperlipidemia    Hypertension    Kidney stone    Plantar fasciitis    cortisone injection   Patient Active Problem List   Diagnosis Date Noted   Hepatic steatosis 11/03/2020    Priority: Medium    History of melanoma in situ 08/02/2016    Priority: Medium    Hyperglycemia 10/05/2015    Priority: Medium    BPH (benign prostatic hyperplasia) 04/02/2014    Priority: Medium    Family history of peripheral vascular disease 03/17/2011    Priority: Medium    Lipoma of back 07/08/2009    Priority: Medium    Hyperlipidemia 02/26/2007    Priority: Medium    Essential hypertension 02/26/2007    Priority: Medium    Osteoarthritis 02/26/2007    Priority: Medium    Status post bilateral hip replacements 12/04/2015    Priority: Low   Hemangioma of liver 04/02/2014    Priority: Low   Nephrolithiasis 04/02/2014    Priority: Low   GERD 06/18/2007    Priority: Low   INSOMNIA, PERSISTENT 02/26/2007    Priority: Low   Palpitations 03/05/2021   Pulmonary nodules 11/03/2020   Thoracic aortic aneurysm without rupture, unspecified part    Family history of aortic aneurysm 07/14/2020   Past Surgical History:  Procedure Laterality Date   BILATERAL ANTERIOR TOTAL HIP ARTHROPLASTY Bilateral 12/04/2015   Procedure: BILATERAL ANTERIOR APPROACH TOTAL HIP  ARTHROPLASTY;  Surgeon: Mcarthur Rossetti, MD;  Location: [facility];  Service: Orthopedics;  Laterality: Bilateral;   left eye surgery     left eye surgery-squint surgery-age 11   none     TONSILLECTOMY     age 74    Family History  Problem Relation Age of Onset   Lung cancer Father        former smoker   Heart disease Father 85       PAD (carotid endarterectomy at 61) and CAD with MI in late 89s early 58s.    Heart disease Sister 3       mi, smoker   Stroke Brother 75       physician who stopped practicing as result. massive stroke, history of a fib and metabolic syndrome, hyperlipidemia   Stroke Paternal Grandfather 61   Crohn's disease Daughter     Medications- reviewed and updated Current Outpatient Medications  Medication Sig Dispense Refill   acetaminophen (TYLENOL) 500 MG tablet Take 1,000 mg by mouth daily as needed.      alfuzosin (UROXATRAL) 10 MG 24 hr tablet Take 10 mg by mouth daily with breakfast.     aspirin EC 81 MG tablet Take 81 mg by mouth daily.     azelastine (ASTELIN) 0.1 % nasal spray Use in each nostril as directed 30 mL 0   fish oil-omega-3 fatty acids 1000 MG capsule Take 2 g by mouth daily.  folic acid (FOLVITE) 1 MG tablet TAKE 1 TABLET BY MOUTH  DAILY 90 tablet 3   HYDROcodone-acetaminophen (NORCO/VICODIN) 5-325 MG tablet Take 1 tablet by mouth every 6 (six) hours as needed for moderate pain or severe pain (kidney stones). 5 tablet 0   losartan-hydrochlorothiazide (HYZAAR) 50-12.5 MG tablet TAKE 1 TABLET BY MOUTH  DAILY 90 tablet 0   Multiple Vitamin (MULTIVITAMIN WITH MINERALS) TABS tablet Take 1 tablet by mouth daily.     oseltamivir (TAMIFLU) 75 MG capsule Take 1 capsule (75 mg total) by mouth 2 (two) times daily. 10 capsule 0   RABEprazole (ACIPHEX) 20 MG tablet TAKE 1 TABLET BY MOUTH  DAILY 90 tablet 3   rosuvastatin (CRESTOR) 10 MG tablet TAKE 1 TABLET BY MOUTH  DAILY 90 tablet 3   traZODone (DESYREL) 50 MG tablet Take 1 tablet (50 mg  total) by mouth at bedtime as needed for sleep. 30 tablet 3   No current facility-administered medications for this visit.    Allergies-reviewed and updated No Known Allergies  Social History   Social History Narrative   Married 1990. 3 kids- 28-18 years old in 2021. Son in [place] (will be moving to [place] for a few years in 2023 for 2nd masters- will be in finance) now married working for [employer]. Daughter in [place]. Son Medical sales representative in 2020.    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      Hobbies: time with dog, outdoors person, renovating/home repair   Objective  Objective:  BP 100/60    Pulse 85    Temp 97.7 F (36.5 C)    Ht 6\' 2"  (1.88 m)    Wt 270 lb (122.5 kg)    SpO2 96%    BMI 34.67 kg/m  Gen: NAD, resting comfortably HEENT: Mucous membranes are moist. Oropharynx normal other than some mild clear drainage Neck: no thyromegaly or cervical lymphadenopathy CV: RRR no murmurs rubs or gallops Lungs: CTAB no crackles, wheeze, rhonchi Abdomen: soft/nontender/nondistended/normal bowel sounds. No rebound or guarding.  Ext: no edema Skin: warm, dry Neuro: grossly normal, moves all extremities, PERRLA    Assessment and Plan  56 y.o. male presenting for annual physical.  Health Maintenance counseling: 1. Anticipatory guidance: Patient counseled regarding regular dental exams -q6 months, eye exams -yearly,  avoiding smoking and second hand smoke , limiting alcohol to 2 beverages per day .  No illicit drugs  2. Risk factor reduction:  Advised patient of need for regular exercise and diet rich and fruits and vegetables to reduce risk of heart attack and stroke.  Exercise- very active- doing mom, daughter, his yard, walking his dog.  Diet/weight management-down 12 lbs from may- eating healthier in general- wife was placed on metformin.  Wt  Readings from Last 3 Encounters:  07/06/21 270 lb (122.5 kg)  03/05/21 279 lb 3.2 oz (126.6 kg)  11/03/20 282 lb (127.9 kg)  3. Immunizations/screenings/ancillary studies DISCUSSED:  -Shingrix vaccine #1- slightly ill today- defer Immunization History  Administered Date(s) Administered   Influenza Whole 04/10/2009, 02/22/2019   Influenza,inj,Quad PF,6+ Mos 03/29/2013   Influenza-Unspecified 04/04/2014, 03/23/2015, 02/19/2016, 02/14/2017, 02/13/2018, 02/18/2020   Moderna Covid-19 Vaccine Bivalent Booster 13yrs & up 04/01/2021   Moderna Sars-Covid-2 Vaccination 05/29/2019, 06/26/2019, 04/09/2020   PPD Test 04/04/2014   Td 04/06/2006   Tdap 08/02/2016  4.  Prostate cancer screening-  follows with Dr. Diona Fanti- biopsies in past- monitoring only for now . Also on alfuzosin for BPH. Had obstruction and required self cath short term when [place] of [place]- keeps cath with him. no recent obstruction issues. Psa had gone up to 16 but came back down- check again today with labs  Lab Results  Component Value Date   PSA 10.61 (H) 07/02/2020   PSA 10.3 11/09/2019   PSA 10.20 05/11/2019   5. Colon cancer screening - 1-/1/16 with 10 year repeat planned with Dr. Earlean Shawl- likely [address] next one.  Still with intermittent bleeding with hemorrhoids at times 6. Skin cancer screening- Dr. Rolinda Roan every 6 months or at least yearly- ends up seeing Dr. Sarajane Jews. advised regular sunscreen use. Denies worrisome, changing, or new skin lesions.  7. Smoking associated screening (lung cancer screening, AAA screen 65-75, UA)- Never smoker 8. STD screening - only active with wife   Status of chronic or acute concerns   #Hyperlipidemia S:Compliant with Crestor 10 mg daily. Strong family history of cardiovascular disease. We have opted to continue aspirin as result and target LDL under 70 Lab Results  Component Value Date   CHOL 108 07/02/2020   HDL 32.40 (L) 07/02/2020   LDLCALC 58 07/02/2020   LDLDIRECT 59.0  08/14/2019   TRIG 88.0 07/02/2020   CHOLHDL 3 07/02/2020   A/P: Hopefully stable -update lipid panel today  #Essential hypertension S: Compliant with alfuzosin, losartan - hydrochlorothiazide 50-12.5 mg daily BP Readings from Last 3 Encounters:  07/06/21 100/60  03/05/21 134/72  11/03/20 122/68  A/P: Perhaps overcontrolled in office-at home reports 120s/70s and has been slightly higher at other visits-continue current medication -short term cut losartan hctz in half  #Thoracic aortic aneurysm S: From CT angiogram on 08/10/2020 with cardiology Dr. Gasper Sells "Mild uncomplicated fusiform aneurysmal dilatation of the ascending thoracic aorta measuring 41 mm in diameter. Recommend annual imaging followup by CTA" - Avoid quinolones - Cardiology follow-up planned April 2023 A/P: stable on last check- will recheck in a few months  -some nodules that would be monitored as well   #Palpitations-Had visit with cardiology and was placed on 2-week cardiac monitor.  Found to have asymptomatic SVT-diltiazem was planned IF symptomatic but has not been symptomatic- he thinks issues could have been stress related.   #Nephrolithiasis S: Patient had a few hydrocodone on hand in case had acute flareup to keep him out of emergency room. A/P: no issues lately- continue monitor . Doing well lately so ok with holding off on refill  # BPH with history of elevated PSA-followed with Dr. Dahlstedt/elevated PSA S: Patient compliant with alfuzosin MRI dec 2020 with PSA over 10 reassuring.  PSA increased to 16And plan was for recheck PSA in about 4 weeks and consideration of MRI followed by fusion biopsy -thankfully #s came back down A/P:thankfully psa improved- update again today and forward to Dr. Dorina Hoyer.    #Hyperglycemia S: Intermittent mild A1c elevations in the low fives. Exercise and diet- doing well Lab Results  Component Value Date   HGBA1C 5.8 07/02/2020   HGBA1C 5.5 08/14/2019   HGBA1C 5.7  02/07/2019   A/P: improved lifestyle- losing weight- update a1c  #GERD S: Compliant with AcipHex. He is on oral B12. Has not done well with h2 blocker trials in past B12 levels related to PPI use: Lab Results  Component Value Date   VITAMINB12 430 02/01/2018  A/P: Controlled. Continue current medications.    Recommended follow up: Return in  about 6 months (around 01/03/2022) for follow up- or sooner if needed. Future Appointments  Date Time Provider [place]  08/06/2021  7:30 AM [facility]  08/13/2021  8:00 AM [facility]  08/25/2021  8:00 AM Chandrasekhar, Mahesh A, MD [facility]   Lab/Order associations: fasting   ICD-10-CM   1. Preventative health care  Z00.00 CBC with Differential/Platelet    Comprehensive metabolic panel    Lipid panel    Hemoglobin A1c    B12    PSA    2. Essential hypertension  I10     3. Hyperlipidemia, unspecified hyperlipidemia type  E78.5 CBC with Differential/Platelet    Comprehensive metabolic panel    Lipid panel    4. Hyperglycemia  R73.9 Hemoglobin A1c    5. Benign prostatic hyperplasia with lower urinary tract symptoms, symptom details unspecified  N40.1 PSA    6. Gastroesophageal reflux disease without esophagitis  K21.9     7. Nephrolithiasis  N20.0     8. Thoracic aortic aneurysm without rupture, unspecified part Chronic I71.20     9. High risk medication use  Z79.899 B12     I,Jada Bradford,acting as a scribe for Garret Reddish, MD.,have documented all relevant documentation on the behalf of Garret Reddish, MD,as directed by  Garret Reddish, MD while in the presence of Garret Reddish, MD.  I, Garret Reddish, MD, have reviewed all documentation for this visit. The documentation on 07/06/21 for the exam, diagnosis, procedures, and orders are all accurate and complete.  Return precautions advised.  Garret Reddish, MD

## 2021-07-06 ENCOUNTER — Encounter: Payer: Self-pay | Admitting: Family Medicine

## 2021-07-06 ENCOUNTER — Ambulatory Visit (INDEPENDENT_AMBULATORY_CARE_PROVIDER_SITE_OTHER): Payer: 59 | Admitting: Family Medicine

## 2021-07-06 ENCOUNTER — Other Ambulatory Visit: Payer: Self-pay

## 2021-07-06 VITALS — BP 100/60 | HR 85 | Temp 97.7°F | Ht 74.0 in | Wt 270.0 lb

## 2021-07-06 DIAGNOSIS — Z79899 Other long term (current) drug therapy: Secondary | ICD-10-CM

## 2021-07-06 DIAGNOSIS — E785 Hyperlipidemia, unspecified: Secondary | ICD-10-CM

## 2021-07-06 DIAGNOSIS — K219 Gastro-esophageal reflux disease without esophagitis: Secondary | ICD-10-CM

## 2021-07-06 DIAGNOSIS — J101 Influenza due to other identified influenza virus with other respiratory manifestations: Secondary | ICD-10-CM | POA: Diagnosis not present

## 2021-07-06 DIAGNOSIS — R0981 Nasal congestion: Secondary | ICD-10-CM

## 2021-07-06 DIAGNOSIS — N401 Enlarged prostate with lower urinary tract symptoms: Secondary | ICD-10-CM | POA: Diagnosis not present

## 2021-07-06 DIAGNOSIS — I10 Essential (primary) hypertension: Secondary | ICD-10-CM

## 2021-07-06 DIAGNOSIS — R739 Hyperglycemia, unspecified: Secondary | ICD-10-CM

## 2021-07-06 DIAGNOSIS — I712 Thoracic aortic aneurysm, without rupture, unspecified: Secondary | ICD-10-CM

## 2021-07-06 DIAGNOSIS — Z0000 Encounter for general adult medical examination without abnormal findings: Secondary | ICD-10-CM

## 2021-07-06 DIAGNOSIS — N200 Calculus of kidney: Secondary | ICD-10-CM

## 2021-07-06 LAB — COMPREHENSIVE METABOLIC PANEL
ALT: 28 U/L (ref 0–53)
AST: 20 U/L (ref 0–37)
Albumin: 4.4 g/dL (ref 3.5–5.2)
Alkaline Phosphatase: 77 U/L (ref 39–117)
BUN: 19 mg/dL (ref 6–23)
CO2: 29 mEq/L (ref 19–32)
Calcium: 9.2 mg/dL (ref 8.4–10.5)
Chloride: 105 mEq/L (ref 96–112)
Creatinine, Ser: 1.02 mg/dL (ref 0.40–1.50)
GFR: 82.2 mL/min (ref >60.00)
Glucose, Bld: 95 mg/dL (ref 70–99)
Potassium: 4 mEq/L (ref 3.5–5.1)
Sodium: 139 mEq/L (ref 135–145)
Total Bilirubin: 0.6 mg/dL (ref 0.2–1.2)
Total Protein: 6.5 g/dL (ref 6.0–8.3)

## 2021-07-06 LAB — POCT INFLUENZA A/B: Influenza A, POC: POSITIVE — AB

## 2021-07-06 LAB — CBC WITH DIFFERENTIAL/PLATELET
Basophils Absolute: 0 10*3/uL (ref 0.0–0.1)
Basophils Relative: 0.7 % (ref 0.0–3.0)
Eosinophils Absolute: 0.1 10*3/uL (ref 0.0–0.7)
Eosinophils Relative: 1.3 % (ref 0.0–5.0)
HCT: 43.4 % (ref 39.0–52.0)
Hemoglobin: 14.5 g/dL (ref 13.0–17.0)
Lymphocytes Relative: 13.9 % (ref 12.0–46.0)
Lymphs Abs: 0.6 10*3/uL — ABNORMAL LOW (ref 0.7–4.0)
MCHC: 33.5 g/dL (ref 30.0–36.0)
MCV: 86.1 fl (ref 78.0–100.0)
Monocytes Absolute: 0.5 10*3/uL (ref 0.1–1.0)
Monocytes Relative: 11.2 % (ref 3.0–12.0)
Neutro Abs: 3 10*3/uL (ref 1.4–7.7)
Neutrophils Relative %: 72.9 % (ref 43.0–77.0)
Platelets: 166 10*3/uL (ref 150.0–400.0)
RBC: 5.03 Mil/uL (ref 4.22–5.81)
RDW: 13.5 % (ref 11.5–15.5)
WBC: 4.1 10*3/uL (ref 4.0–10.5)

## 2021-07-06 LAB — LIPID PANEL
Cholesterol: 87 mg/dL (ref 0–200)
HDL: 28.1 mg/dL — ABNORMAL LOW (ref >39.00)
LDL Cholesterol: 45 mg/dL (ref 0–99)
NonHDL: 59.05
Total CHOL/HDL Ratio: 3
Triglycerides: 70 mg/dL (ref 0.0–149.0)
VLDL: 14 mg/dL (ref 0.0–40.0)

## 2021-07-06 LAB — PSA: PSA: 11.01 ng/mL — ABNORMAL HIGH (ref 0.10–4.00)

## 2021-07-06 LAB — HEMOGLOBIN A1C: Hgb A1c MFr Bld: 5.8 % (ref 4.6–6.5)

## 2021-07-06 LAB — VITAMIN B12: Vitamin B-12: 312 pg/mL (ref 211–911)

## 2021-07-06 MED ORDER — OSELTAMIVIR PHOSPHATE 75 MG PO CAPS: 75.0000 mg | ORAL_CAPSULE | Freq: Two times a day (BID) | ORAL | 0 refills | Status: DC

## 2021-07-06 NOTE — Patient Instructions (Addendum)
Consider shingrix when not ill  Temp please do flu swab under nasal congestion today. Hes done multiple negative covid tests already   Please stop by lab before you go If you have mychart- we will send your results within 3 business days of [place] receiving them.  If you do not have mychart- we will call you about results within 5 business days of [place] receiving them.  *please also note that you will see labs on mychart as soon as they post. I will later go in and write notes on them- will say "notes from Dr. Yong Channel"   Recommended follow up: Return in about 6 months (around 01/03/2022) for follow up- or sooner if needed.

## 2021-07-06 NOTE — Addendum Note (Signed)
Addended by: Marin Olp on: 07/06/2021 10:47 AM   Modules accepted: Orders

## 2021-07-06 NOTE — Addendum Note (Signed)
Addended by: Clyde Lundborg A on: 07/06/2021 10:32 AM   Modules accepted: Orders

## 2021-07-06 NOTE — Progress Notes (Signed)
Phone [phone] In person visit   Subjective:   Andrew Wilkins is a 56 y.o. year old very pleasant male patient who presents for/with See problem oriented charting  This visit occurred during the SARS-CoV-2 public health emergency.  Safety protocols were in place, including screening questions prior to the visit, additional usage of staff PPE, and extensive cleaning of exam room while observing appropriate contact time as indicated for disinfecting solutions.   Past Medical History-  Patient Active Problem List   Diagnosis Date Noted   Hepatic steatosis 11/03/2020    Priority: Medium    History of melanoma in situ 08/02/2016    Priority: Medium    Hyperglycemia 10/05/2015    Priority: Medium    BPH (benign prostatic hyperplasia) 04/02/2014    Priority: Medium    Family history of peripheral vascular disease 03/17/2011    Priority: Medium    Lipoma of back 07/08/2009    Priority: Medium    Hyperlipidemia 02/26/2007    Priority: Medium    Essential hypertension 02/26/2007    Priority: Medium    Osteoarthritis 02/26/2007    Priority: Medium    Status post bilateral hip replacements 12/04/2015    Priority: Low   Hemangioma of liver 04/02/2014    Priority: Low   Nephrolithiasis 04/02/2014    Priority: Low   GERD 06/18/2007    Priority: Low   INSOMNIA, PERSISTENT 02/26/2007    Priority: Low   Palpitations 03/05/2021   Pulmonary nodules 11/03/2020   Thoracic aortic aneurysm without rupture, unspecified part    Family history of aortic aneurysm 07/14/2020    Medications- reviewed and updated Current Outpatient Medications  Medication Sig Dispense Refill   acetaminophen (TYLENOL) 500 MG tablet Take 1,000 mg by mouth daily as needed.      alfuzosin (UROXATRAL) 10 MG 24 hr tablet Take 10 mg by mouth daily with breakfast.     aspirin EC 81 MG tablet Take 81 mg by mouth daily.     azelastine (ASTELIN) 0.1 % nasal spray Use in each nostril as directed 30 mL 0   fish  oil-omega-3 fatty acids 1000 MG capsule Take 2 g by mouth daily.     folic acid (FOLVITE) 1 MG tablet TAKE 1 TABLET BY MOUTH  DAILY 90 tablet 3   HYDROcodone-acetaminophen (NORCO/VICODIN) 5-325 MG tablet Take 1 tablet by mouth every 6 (six) hours as needed for moderate pain or severe pain (kidney stones). 5 tablet 0   losartan-hydrochlorothiazide (HYZAAR) 50-12.5 MG tablet TAKE 1 TABLET BY MOUTH  DAILY 90 tablet 0   Multiple Vitamin (MULTIVITAMIN WITH MINERALS) TABS tablet Take 1 tablet by mouth daily.     oseltamivir (TAMIFLU) 75 MG capsule Take 1 capsule (75 mg total) by mouth 2 (two) times daily. 10 capsule 0   RABEprazole (ACIPHEX) 20 MG tablet TAKE 1 TABLET BY MOUTH  DAILY 90 tablet 3   rosuvastatin (CRESTOR) 10 MG tablet TAKE 1 TABLET BY MOUTH  DAILY 90 tablet 3   traZODone (DESYREL) 50 MG tablet Take 1 tablet (50 mg total) by mouth at bedtime as needed for sleep. 30 tablet 3   No current facility-administered medications for this visit.     Objective:  BP 100/60    Pulse 85    Temp 97.7 F (36.5 C)    Ht 6\' 2"  (1.88 m)    Wt 270 lb (122.5 kg)    SpO2 96%    BMI 34.67 kg/m  Gen: NAD, resting comfortably CV:  RRR no murmurs rubs or gallops Clear rhinorrhea, some drainage down pharynx Lungs: CTAB no crackles, wheeze, rhonchi Ext: no edema    Assessment and Plan   # flu like symptoms  S: presents with including temperature up to 100, nasal congestion, fatigue  -other symptoms: clear rhinorrhea -started: yesterday morning -inside 48 hour treatment window if needed for tamiflu: yes -high risk condition (children <5, adults >65, chronic pulmonary or cardiac condition, immunosuppression, pregnancy, nursing home resident, morbid obesity) : BMI right near 35, fatty liver -symptoms show no change -previous treatments: dayquil, nyquil - patient did  receive flu shot this year.  - potential sick exposrues through work  ROS-denies SOB, NVD, sinus or dental pain A/P:  Flu-like illness:   Influenza A History and exam today are suggestive of viral process. Patients influenza test was positive.  Pretest probability of influenza was moderate. High risk condition: yes- BMI near 35 with HTN, HLD, fatty liver, strong family history peripheral arterial disease  Patient will be treated with Tamiflu. Prior excellent response Symptomatic treatment with: ok to continue OTC treatments  Finally, we reviewed reasons to return to care including if symptoms worsen or persist or new concerns arise.  See other note but mentioned cutting BP meds in half until feeling better with BP running low normal  Recommended follow up: Return in about 6 months (around 01/03/2022) for follow up- or sooner if needed. Future Appointments  Date Time Provider [place]  08/06/2021  7:30 AM [facility]  08/13/2021  8:00 AM [facility]  08/25/2021  8:00 AM Chandrasekhar, Terisa Starr, MD [facility]    Lab/Order associations:   ICD-10-CM   1. Influenza A  J10.1     3. Nasal congestion  R09.81 POCT Influenza A/B    Meds ordered this encounter  Medications   oseltamivir (TAMIFLU) 75 MG capsule    Sig: Take 1 capsule (75 mg total) by mouth 2 (two) times daily.    Dispense:  10 capsule    Refill:  0    Return precautions advised.  Garret Reddish, MD

## 2021-07-09 ENCOUNTER — Telehealth: Payer: Self-pay

## 2021-07-09 DIAGNOSIS — I712 Thoracic aortic aneurysm, without rupture, unspecified: Secondary | ICD-10-CM

## 2021-07-09 NOTE — Telephone Encounter (Signed)
Updated order placed for CT aorta.  Original order expires 08/13/21.

## 2021-07-09 NOTE — Telephone Encounter (Signed)
-----   Message from Stephani Police, RN sent at 07/08/2021  2:37 PM EST ----- Regarding: FW: New Order  ----- Message ----- From: Livingston Diones, RT Sent: 07/08/2021   2:30 PM EST To: [facility] Triage Subject: New Order                                      This patient will be out of town for when his CT is scheduled. He needs to move it later in the month but needs a new order. The current order expires on 08/13/2021. If yall let me know when its in I will get the patient rescheduled.   Thanks! Nunzio Cobbs

## 2021-08-06 ENCOUNTER — Other Ambulatory Visit: Payer: Self-pay

## 2021-08-06 ENCOUNTER — Other Ambulatory Visit: Payer: 59 | Admitting: *Deleted

## 2021-08-06 DIAGNOSIS — I712 Thoracic aortic aneurysm, without rupture, unspecified: Secondary | ICD-10-CM

## 2021-08-06 LAB — LIPID PANEL
Chol/HDL Ratio: 3.1 ratio (ref 0.0–5.0)
Cholesterol, Total: 90 mg/dL — ABNORMAL LOW (ref 100–199)
HDL: 29 mg/dL — ABNORMAL LOW (ref >39)
LDL Chol Calc (NIH): 44 mg/dL (ref 0–99)
Triglycerides: 82 mg/dL (ref 0–149)
VLDL Cholesterol Cal: 17 mg/dL (ref 5–40)

## 2021-08-06 LAB — BASIC METABOLIC PANEL
BUN/Creatinine Ratio: 18 (ref 9–20)
BUN: 17 mg/dL (ref 6–24)
CO2: 27 mmol/L (ref 20–29)
Calcium: 9.3 mg/dL (ref 8.7–10.2)
Chloride: 104 mmol/L (ref 96–106)
Creatinine, Ser: 0.94 mg/dL (ref 0.76–1.27)
Glucose: 97 mg/dL (ref 70–99)
Potassium: 4.3 mmol/L (ref 3.5–5.2)
Sodium: 142 mmol/L (ref 134–144)
eGFR: 95 mL/min/{1.73_m2} (ref >59)

## 2021-08-06 LAB — HEPATIC FUNCTION PANEL
ALT: 27 IU/L (ref 0–44)
AST: 17 IU/L (ref 0–40)
Albumin: 4.4 g/dL (ref 3.8–4.9)
Alkaline Phosphatase: 89 IU/L (ref 44–121)
Bilirubin Total: 0.5 mg/dL (ref 0.0–1.2)
Bilirubin, Direct: 0.17 mg/dL (ref 0.00–0.40)
Total Protein: 6.3 g/dL (ref 6.0–8.5)

## 2021-08-13 ENCOUNTER — Inpatient Hospital Stay: Admission: RE | Admit: 2021-08-13 | Payer: 59 | Source: Ambulatory Visit

## 2021-08-14 IMAGING — CT CT ANGIO CHEST
2 of 8 series · 16 of 46 positions shown · IV contrast (OMNIPAQUE 350)
Comparison: None.

CLINICAL DATA: Family history of thoracic aortic aneurysm.

EXAM:
CT ANGIOGRAPHY CHEST WITH CONTRAST
TECHNIQUE: Multidetector CT imaging of the chest was performed using the
standard protocol during bolus administration of intravenous
contrast. Multiplanar CT image reconstructions and MIPs were
obtained to evaluate the vascular anatomy.
CONTRAST:  100mL OMNIPAQUE IOHEXOL 350 MG/ML SOLN

[Series 4: aorta 3.0 bf37 2 · axial · 0.83mm/px · z∈[-346,-48]mm · 13 of 117 slices shown]
[im 9/117  lung]
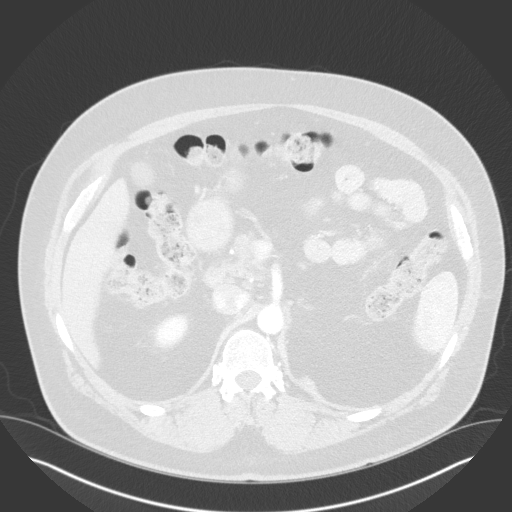
[im 17/117  soft-tissue]
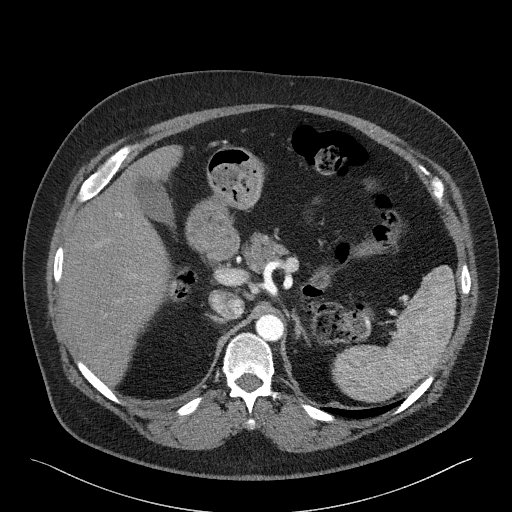
[im 25/117  lung]
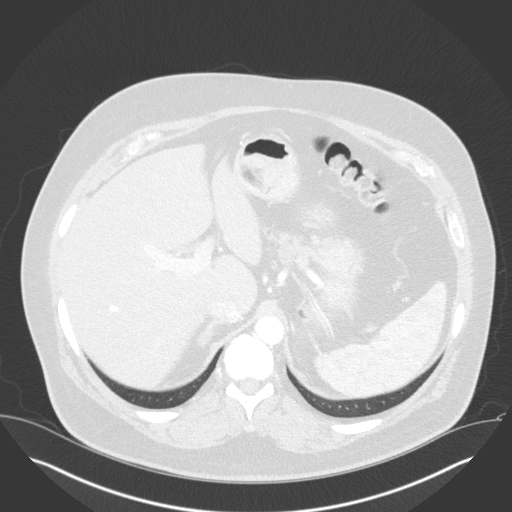
[im 34/117  soft-tissue]
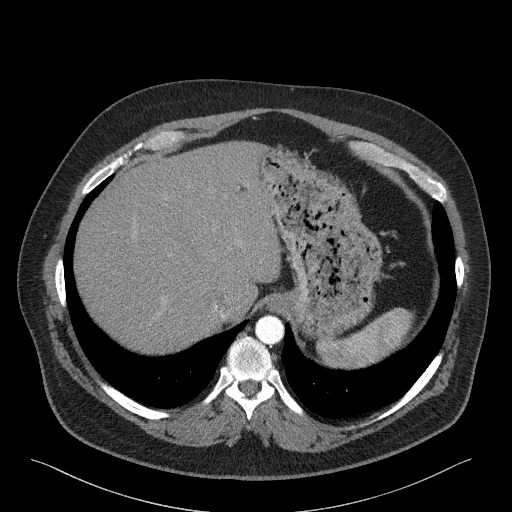
[im 42/117  lung]
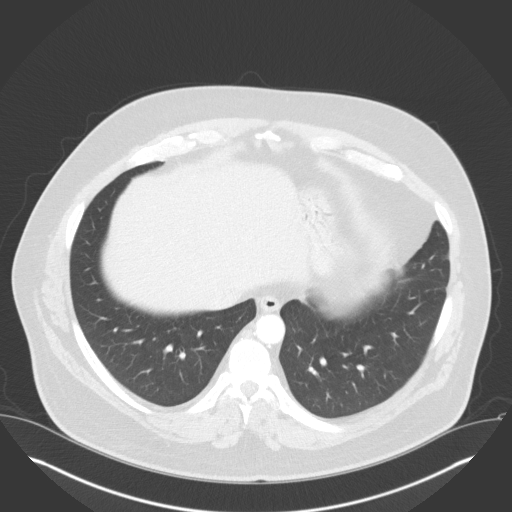
[im 50/117  soft-tissue]
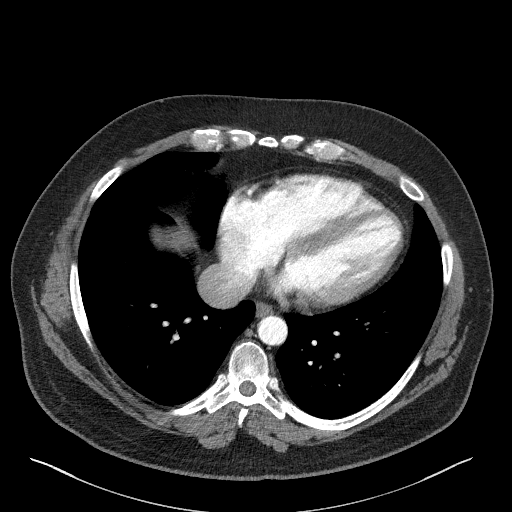
[im 59/117  lung]
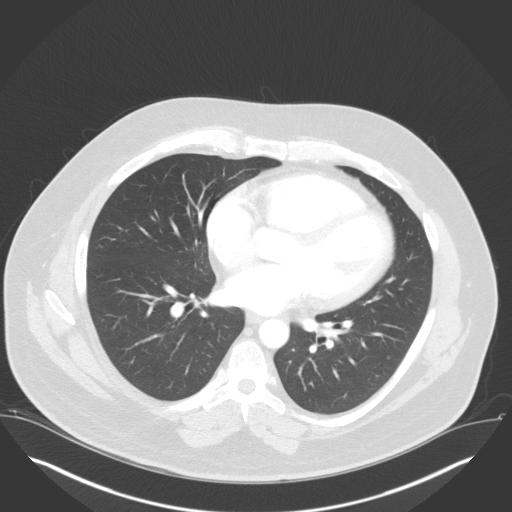
[im 67/117  soft-tissue]
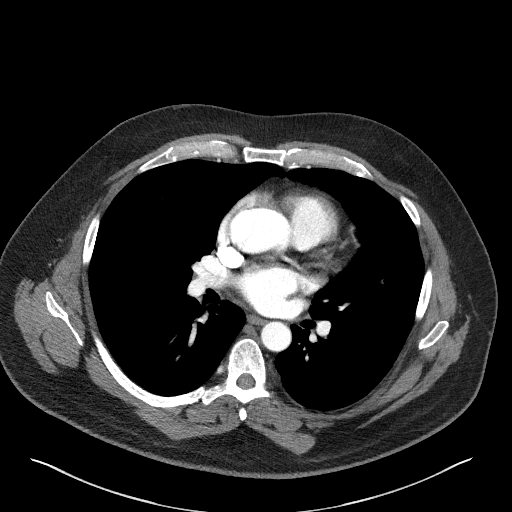
[im 75/117  lung]
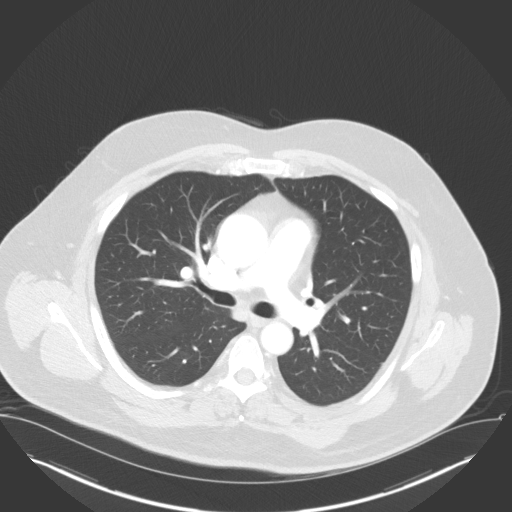
[im 83/117  soft-tissue]
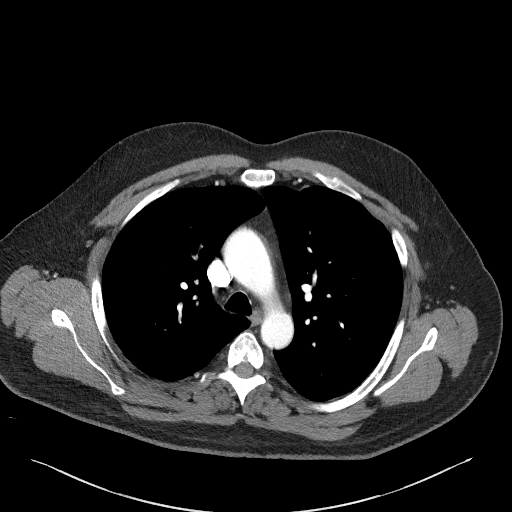
[im 92/117  lung]
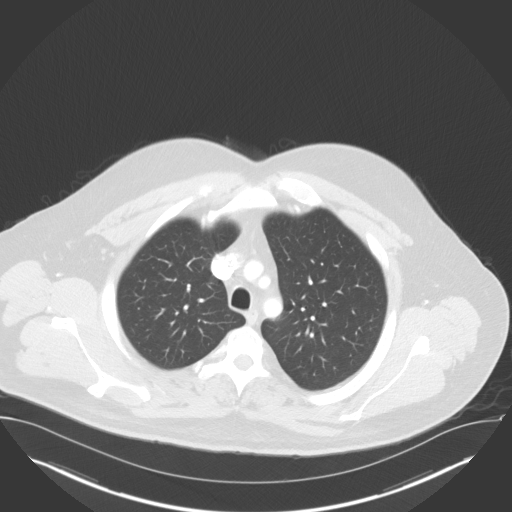
[im 100/117  soft-tissue]
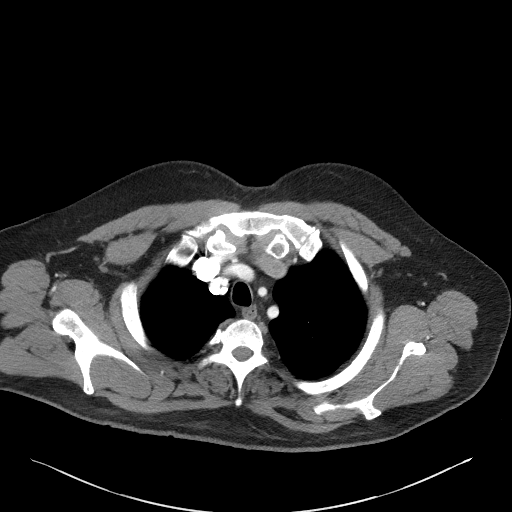
[im 108/117  lung]
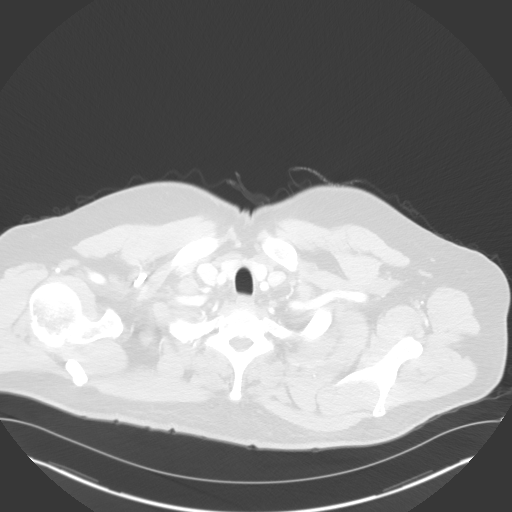

[Series 7: coronals · coronal · 0.70mm/px · 3 of 136 slices shown]
[im 34/136  soft-tissue]
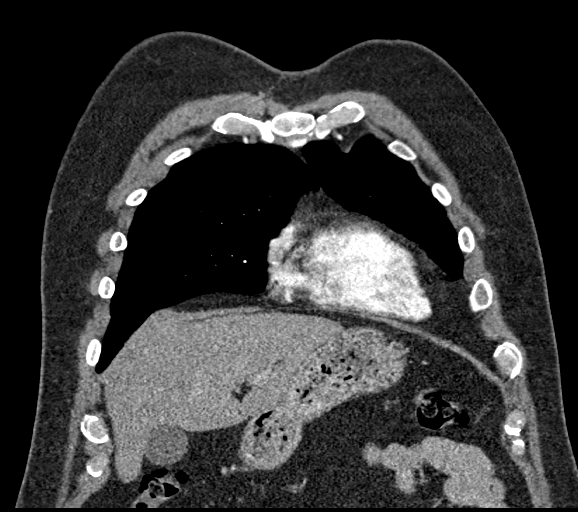
[im 68/136  soft-tissue]
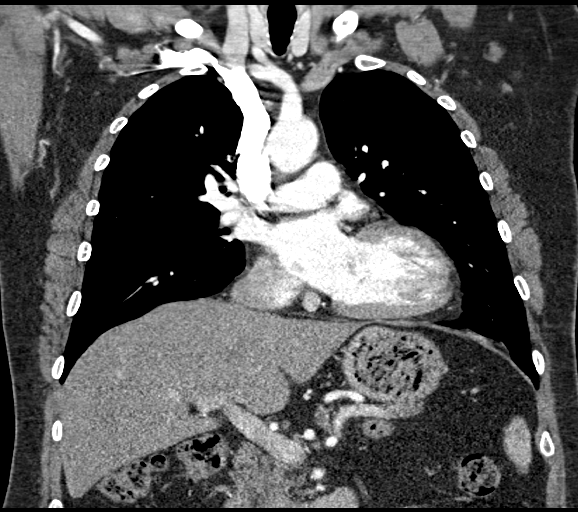
[im 102/136  soft-tissue]
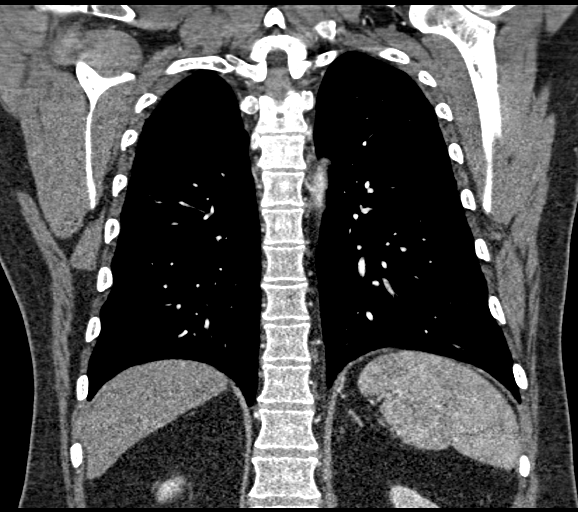

[16 of 46 positions shown; findings below may reference images not displayed]

FINDINGS: Vascular Findings:

Mild fusiform aneurysmal dilatation of the ascending thoracic aorta
with measurements as follows. The thoracic aorta tapers to a normal
caliber at the level of the aortic arch. No evidence of thoracic
aortic dissection or periaortic stranding on this nongated
examination.

Conventional configuration of the aortic arch. The branch vessels of
the aortic arch appear widely patent throughout their imaged
courses. The descending thoracic aorta is of normal caliber and
widely patent without a hemodynamically significant narrowing.

Borderline cardiomegaly.  No pericardial effusion.

Although this examination was not tailored for the evaluation the
pulmonary arteries, there are no discrete filling defects within the
central pulmonary arterial tree to suggest central pulmonary
embolism. Normal caliber of the main pulmonary artery.

-------------------------------------------------------------

Thoracic aortic measurements:

SINOTUBULAR JUNCTION: 36 mm as measured in greatest oblique short
axis coronal dimension.

PROXIMAL ASCENDING THORACIC AORTA: 42 mm as measured in greatest
oblique short axis axial dimension at the level of the main
pulmonary artery (image 45, series 4) and approximately 41 mm in
greatest oblique short axis coronal diameter (image 51, series 7).

AORTIC ARCH: 26 mm as measured in greatest oblique short axis
sagittal dimension.

PROXIMAL DESCENDING THORACIC AORTA: 25 mm as measured in greatest
oblique short axis axial dimension at the level of the main
pulmonary artery.

DISTAL DESCENDING THORACIC AORTA: 24 mm as measured in greatest
oblique short axis axial dimension at the level of the diaphragmatic
hiatus.

Review of the MIP images confirms the above findings.

-------------------------------------------------------------

Non-Vascular Findings:

Mediastinum/Lymph Nodes: No bulky mediastinal, hilar or axillary
lymphadenopathy

Lungs/Pleura: No discrete focal airspace opacities. No pleural
effusion or pneumothorax. The central pulmonary airways appear
widely patent.

There is a partially calcified punctate (approximately 3 mm)
granuloma within the superior segment of the right lower lobe (image
40, series 5).

Note is made of punctate (3 mm) noncalcified nodule nodules within
bilateral lower lobes (right-image 63; left-image 89, series 5).

Upper abdomen: Limited early arterial phase evaluation of the upper
abdomen demonstrates a granuloma within the right lobe of the liver
(image 93, series 4). There is diffuse decreased attenuation hepatic
parenchyma on this postcontrast examination suggestive of hepatic
steatosis.

Musculoskeletal: No acute or aggressive osseous abnormalities.
Mild-to-moderate DDD of C6-C7, incompletely evaluated. Regional soft
tissues appear normal. Normal appearance of the thyroid gland.
IMPRESSION: 1. Mild uncomplicated fusiform aneurysmal dilatation of the
ascending thoracic aorta measuring 41 mm in diameter. Recommend
annual imaging followup by CTA. This recommendation follows 3979
ACCF/AHA/AATS/ACR/ASA/SCA/NEMBANG/SENATUS/KLPIGBB/MAYABEL Guidelines for the
Diagnosis and Management of Patients with Thoracic Aortic Disease.
Circulation. 3979; 121: E266-e369. Aortic aneurysm NOS ([id number])
2. Punctate (3 mm) noncalcified nodules within the bilateral lower
lobes. Attention on the above recommended annual CTA is advised.
3. Suspected hepatic steatosis. Correlation with LFTs is advised.

## 2021-08-20 ENCOUNTER — Ambulatory Visit (INDEPENDENT_AMBULATORY_CARE_PROVIDER_SITE_OTHER)
Admission: RE | Admit: 2021-08-20 | Discharge: 2021-08-20 | Disposition: A | Discharge status: Home / Self Care | Payer: 59 | Source: Ambulatory Visit | Attending: Internal Medicine | Admitting: Internal Medicine

## 2021-08-20 ENCOUNTER — Other Ambulatory Visit: Payer: Self-pay

## 2021-08-20 DIAGNOSIS — I712 Thoracic aortic aneurysm, without rupture, unspecified: Secondary | ICD-10-CM

## 2021-08-20 MED ORDER — IOHEXOL 350 MG/ML SOLN
100.0000 mL | Freq: Once | INTRAVENOUS | Status: AC | PRN
Start: 2021-08-20 — End: 2021-08-20
  Administered 2021-08-20: 100 mL via INTRAVENOUS

## 2021-08-24 NOTE — Progress Notes (Signed)
?Cardiology Office Note:   ? ?Date:  08/25/2021  ? ?ID:  Andrew Wilkins, DOB 04/24/1966, MRN [id number] ? ?PCP:  Marin Olp, MD  ?[facility] Cardiologist:  Werner Lean, MD  ?[facility] Electrophysiologist:  None  ? ?CC: Follow up Aorta ? ?History of Present Illness:   ? ?Andrew Wilkins is a 56 y.o. male with a hx of Morbid Obesity, HTN, HLD, Family history of PAD who presented for evaluation 07/14/20. ?2022:  Small Pulm nodules, Aorta 41 mm.  Lost Father in law and brother; had palpitations around that time with asymptomatic P-SVT on monitor ?2023: Stable Aorta nodules resolved. ? ?Patient notes that he is doing even better then last visit.   ?Still working.  His mother is 89 which is a challenge.  Wife and kids  ?There are no interval hospital/ED visit.   ? ?No chest pain or pressure .  No SOB/DOE and no PND/Orthopnea.  No weight gain or leg swelling.  No palpitations or syncope. ? ?Past Medical History:  ?Diagnosis Date  ? Arthritis   ? BPH (benign prostatic hyperplasia)   ? GERD (gastroesophageal reflux disease)   ? Hyperlipidemia   ? Hypertension   ? Kidney stone   ? Plantar fasciitis   ? cortisone injection  ? ? ?Past Surgical History:  ?Procedure Laterality Date  ? BILATERAL ANTERIOR TOTAL HIP ARTHROPLASTY Bilateral 12/04/2015  ? Procedure: BILATERAL ANTERIOR APPROACH TOTAL HIP ARTHROPLASTY;  Surgeon: Mcarthur Rossetti, MD;  Location: [facility];  Service: Orthopedics;  Laterality: Bilateral;  ? left eye surgery    ? left eye surgery-squint surgery-age 18  ? none    ? TONSILLECTOMY    ? age 55  ? ? ?Current Medications: ?Current Meds  ?Medication Sig  ? acetaminophen (TYLENOL) 500 MG tablet Take 1,000 mg by mouth daily as needed.   ? alfuzosin (UROXATRAL) 10 MG 24 hr tablet Take 10 mg by mouth daily with breakfast.  ? aspirin EC 81 MG tablet Take 81 mg by mouth daily.  ? azelastine (ASTELIN) 0.1 % nasal spray Use in each nostril as directed  ? fish oil-omega-3 fatty acids 1000 MG  capsule Take 2 g by mouth daily.  ? folic acid (FOLVITE) 1 MG tablet TAKE 1 TABLET BY MOUTH  DAILY  ? HYDROcodone-acetaminophen (NORCO/VICODIN) 5-325 MG tablet Take 1 tablet by mouth every 6 (six) hours as needed for moderate pain or severe pain (kidney stones).  ? losartan-hydrochlorothiazide (HYZAAR) 50-12.5 MG tablet TAKE 1 TABLET BY MOUTH  DAILY  ? Multiple Vitamin (MULTIVITAMIN WITH MINERALS) TABS tablet Take 1 tablet by mouth daily.  ? RABEprazole (ACIPHEX) 20 MG tablet TAKE 1 TABLET BY MOUTH  DAILY  ? rosuvastatin (CRESTOR) 10 MG tablet TAKE 1 TABLET BY MOUTH  DAILY  ? traZODone (DESYREL) 50 MG tablet Take 1 tablet (50 mg total) by mouth at bedtime as needed for sleep.  ? [DISCONTINUED] oseltamivir (TAMIFLU) 75 MG capsule Take 1 capsule (75 mg total) by mouth 2 (two) times daily.  ?  ? ?Allergies:   Patient has no known allergies.  ? ?Social History  ? ?Socioeconomic History  ? Marital status: Married  ?  Spouse name: Not on file  ? Number of children: Not on file  ? Years of education: Not on file  ? Highest education level: Not on file  ?Occupational History  ? Not on file  ?Tobacco Use  ? Smoking status: Never  ? Smokeless tobacco: Never  ?Substance and Sexual  Activity  ? Alcohol use: Yes  ?  Comment: 3 per month  ? Drug use: No  ? Sexual activity: Yes  ?Other Topics Concern  ? Not on file  ?Social History Narrative  ? Married 1990. 3 kids- 67-39 years old in 2021. Son in [place] (will be moving to [place] for a few years in 2023 for 2nd masters- will be in finance) now married working for [employer]. Daughter in [place]. Son Medical sales representative in 2020.   ? New dog in 2015  ?   ? 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 Background as a paramedic  ? Works at least 60 hours a week  ?   ? Hobbies: time with dog, outdoors person, renovating/home repair  ? ?Social Determinants of Health  ? ?Financial Resource Strain: Not on file  ?Food Insecurity: Not on file   ?Transportation Needs: Not on file  ?Physical Activity: Not on file  ?Stress: Not on file  ?Social Connections: Not on file  ?  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 ? ?Family History: ?The patient's family history includes Crohn's disease in his daughter; Heart disease (age of onset: 6) in his father; Heart disease (age of onset: 6) in his sister; Lung cancer in his father; Stroke (age of onset: 3) in his brother; Stroke (age of onset: 43) in his paternal grandfather. ?History of coronary artery disease notable for father. ?History of peripheral arterial disease notable for grandfather. ?History of heart failure notable for no members. ?No history of cardiomyopathies including hypertrophic cardiomyopathy, left ventricular non-compaction, or arrhythmogenic right ventricular cardiomyopathy. ?History of arrhythmia notable for atrial fibrillation. ?Denies family history of sudden cardiac death including drowning, car accidents, or unexplained deaths in the family. ?Mother had aortic aneurysm. ?Brother was ICU doctors with a stroke. ? ?ROS:   ?Please see the history of present illness.    ? All other systems reviewed and are negative. ? ?EKGs/Labs/Other Studies Reviewed:   ? ?The following studies were reviewed today: ? ?EKG:   ?07/14/2020: SR rate 86 iRBBB ? ?Transthoracic Echocardiogram: ?Date: 08/10/20 ?Results: ?1. Left ventricular ejection fraction, by estimation, is 55 to 60%. The  ?left ventricle has normal function. The left ventricle has no regional  ?wall motion abnormalities. Left ventricular diastolic parameters were  ?normal.  ? 2. Right ventricular systolic function is normal. The right ventricular  ?size is mildly enlarged. There is mildly elevated pulmonary artery  ?systolic pressure. The estimated right ventricular systolic pressure is  ?93.2 mmHg.  ? 3. Left atrial size was  mildly dilated.  ? 4. Right atrial size was mildly dilated.  ? 5. The mitral valve is normal in structure. Trivial mitral valve  ?regurgitation. No evidence of mitral stenosis.  ? 6. The aortic valve is tricuspid. Aortic valve regurgitation is not  ?visualized. No aortic stenosis is present.  ? 7. The inferior vena cava is dilated in size with >50% respiratory  ?variability, suggesting right atrial pressure of 8 mmHg.  ? 8. Aortic dilatation noted. There is moderate dilatation of the ascending  ?aorta, measuring 46 mm. Consider MRA or CTA of chest for further  ?evaluation.  ? ?CT Aorta: ?Date: 08/10/20 ?Results: ?IMPRESSION: ?1. Mild uncomplicated fusiform aneurysmal dilatation of the ?ascending thoracic aorta measuring 41 mm in diameter. Recommend ?annual imaging followup by CTA. This  recommendation follows 2010 ?ACCF/AHA/AATS/ACR/ASA/SCA/SCAI/SIR/STS/SVM Guidelines for the ?Diagnosis and Management of Patients with Thoracic Aortic Disease. ?Circulation. 2010; 121: K562-B638. Aortic aneurysm NOS (ICD10-I71.9) ?2. Punctate (3 mm) noncalcified nodules within the bilateral lower ?lobes. Attention on the above recommended annual CTA is advised. ?3. Suspected hepatic steatosis. Correlation with LFTs is advised. ? ?Recent Labs: ?07/06/2021: Hemoglobin 14.5; Platelets 166.0 ?08/06/2021: ALT 27; BUN 17; Creatinine, Ser 0.94; Potassium 4.3; Sodium 142  ?Recent Lipid Panel ?   ?Component Value Date/Time  ? CHOL 90 (L) 08/06/2021 0730  ? TRIG 82 08/06/2021 0730  ? TRIG 70 05/08/2006 0949  ? HDL 29 (L) 08/06/2021 0730  ? CHOLHDL 3.1 08/06/2021 0730  ? CHOLHDL 3 07/06/2021 1039  ? VLDL 14.0 07/06/2021 1039  ? LDLCALC 44 08/06/2021 0730  ? LDLDIRECT 59.0 08/14/2019 0911  ? ? ?Physical Exam:   ? ?VS:  BP 110/60   Pulse 66   Ht '6\' 2"'$  (1.88 m)   Wt 280 lb (127 kg)   SpO2 97%   BMI 35.95 kg/m?    ? ?Wt Readings from Last 3 Encounters:  ?08/25/21 280 lb (127 kg)  ?07/06/21 270 lb (122.5 kg)  ?03/05/21 279 lb 3.2 oz (126.6 kg)  ?   ?Gen: no distress, Morbid Obesity   ?Neck: No JVD,  ?Ears: Bilateral Pilar Plate Sign ?Cardiac: No Rubs or Gallops, no Murmur, RRR +2 radial pulses ?Respiratory: Clear to auscultation bilaterally, normal effort, normal  respirator

## 2021-08-25 ENCOUNTER — Ambulatory Visit: Payer: 59 | Admitting: Internal Medicine

## 2021-08-25 ENCOUNTER — Other Ambulatory Visit: Payer: Self-pay

## 2021-08-25 ENCOUNTER — Encounter: Payer: Self-pay | Admitting: Internal Medicine

## 2021-08-25 VITALS — BP 110/60 | HR 66 | Ht 74.0 in | Wt 280.0 lb

## 2021-08-25 DIAGNOSIS — I712 Thoracic aortic aneurysm, without rupture, unspecified: Secondary | ICD-10-CM | POA: Diagnosis not present

## 2021-08-25 DIAGNOSIS — Z8249 Family history of ischemic heart disease and other diseases of the circulatory system: Secondary | ICD-10-CM

## 2021-08-25 DIAGNOSIS — I10 Essential (primary) hypertension: Secondary | ICD-10-CM

## 2021-08-25 DIAGNOSIS — E782 Mixed hyperlipidemia: Secondary | ICD-10-CM | POA: Diagnosis not present

## 2021-08-25 NOTE — Patient Instructions (Addendum)
Medication Instructions:  ?Your physician recommends that you continue on your current medications as directed. Please refer to the Current Medication list given to you today. ? ?*If you need a refill on your cardiac medications before your next appointment, please call your pharmacy* ? ? ?Lab Work: ?NONE ?If you have labs (blood work) drawn today and your tests are completely normal, you will receive your results only by: ?MyChart Message (if you have MyChart) OR ?A paper copy in the mail ?If you have any lab test that is abnormal or we need to change your treatment, we will call you to review the results. ? ? ?Testing/Procedures: ?Your physician has requested that you have an echocardiogram in Feb. 2024. Echocardiography is a painless test that uses sound waves to create images of your heart. It provides your doctor with information about the size and shape of your heart and how well your heart?s chambers and valves are working. This procedure takes approximately one hour. There are no restrictions for this procedure. ? ? ? ?Follow-Up: ?At [facility], you and your health needs are our priority.  As part of our continuing mission to provide you with exceptional heart care, we have created designated Provider Care Teams.  These Care Teams include your primary Cardiologist (physician) and Advanced Practice Providers (APPs -  Physician Assistants and Nurse Practitioners) who all work together to provide you with the care you need, when you need it. ? ? ?Your next appointment:   ?1 year(s) ? ?The format for your next appointment:   ?In Person ? ?Provider:   ?Werner Lean, MD   ? ?

## 2021-09-08 ENCOUNTER — Other Ambulatory Visit: Payer: Self-pay | Admitting: Family Medicine

## 2021-09-09 MED ORDER — AZELASTINE HCL 0.1 % NA SOLN: NASAL | 0 refills | Status: DC

## 2021-09-13 ENCOUNTER — Other Ambulatory Visit: Payer: Self-pay | Admitting: Family Medicine

## 2021-09-15 ENCOUNTER — Other Ambulatory Visit: Payer: Self-pay

## 2021-09-15 MED ORDER — AZELASTINE HCL 0.1 % NA SOLN: NASAL | 5 refills | Status: AC

## 2022-01-06 ENCOUNTER — Ambulatory Visit: Payer: 59 | Admitting: Family Medicine

## 2022-01-10 ENCOUNTER — Encounter: Payer: Self-pay | Admitting: Family Medicine

## 2022-01-10 ENCOUNTER — Ambulatory Visit: Payer: 59 | Admitting: Family Medicine

## 2022-01-10 VITALS — BP 120/78 | HR 74 | Temp 98.4°F | Ht 74.0 in | Wt 275.4 lb

## 2022-01-10 DIAGNOSIS — R739 Hyperglycemia, unspecified: Secondary | ICD-10-CM | POA: Diagnosis not present

## 2022-01-10 DIAGNOSIS — N401 Enlarged prostate with lower urinary tract symptoms: Secondary | ICD-10-CM

## 2022-01-10 DIAGNOSIS — I10 Essential (primary) hypertension: Secondary | ICD-10-CM

## 2022-01-10 DIAGNOSIS — K625 Hemorrhage of anus and rectum: Secondary | ICD-10-CM | POA: Diagnosis not present

## 2022-01-10 NOTE — Patient Instructions (Addendum)
Flu shot- we should have these available within a month or two but please let us know if you get at outside pharmacy  We will call you within two weeks about your referral to Dr. Earlean Shawl. If you do not hear within 2 weeks, give his office a call [place] [address] [place], [place] [address] [phone]  Please stop by lab before you go If you have mychart- we will send your results within 3 business days of [place] receiving them.  If you do not have mychart- we will call you about results within 5 business days of [place] receiving them.  *please also note that you will see labs on mychart as soon as they post. I will later go in and write notes on them- will say "notes from Dr. Yong Channel"   Recommended follow up: Return in about 6 months (around 07/13/2022) for physical or sooner if needed.Schedule b4 you leave.

## 2022-01-10 NOTE — Progress Notes (Signed)
Phone [phone] In person visit   Subjective:   Andrew Wilkins is a 56 y.o. year old very pleasant male patient who presents for/with See problem oriented charting Chief Complaint  Patient presents with   Follow-up   Hyperlipidemia   Hypertension   Past Medical History-  Patient Active Problem List   Diagnosis Date Noted   Thoracic aortic aneurysm without rupture, unspecified part [facility])     Priority: High   Hepatic steatosis 11/03/2020    Priority: Medium    History of melanoma in situ 08/02/2016    Priority: Medium    Hyperglycemia 10/05/2015    Priority: Medium    BPH (benign prostatic hyperplasia) 04/02/2014    Priority: Medium    Family history of peripheral vascular disease 03/17/2011    Priority: Medium    Lipoma of back 07/08/2009    Priority: Medium    Hyperlipidemia 02/26/2007    Priority: Medium    Essential hypertension 02/26/2007    Priority: Medium    Osteoarthritis 02/26/2007    Priority: Medium    Status post bilateral hip replacements 12/04/2015    Priority: Low   Hemangioma of liver 04/02/2014    Priority: Low   Nephrolithiasis 04/02/2014    Priority: Low   GERD 06/18/2007    Priority: Low   INSOMNIA, PERSISTENT 02/26/2007    Priority: Low   Family history of aortic aneurysm 07/14/2020    Priority: 1.   Palpitations 03/05/2021   Pulmonary nodules 11/03/2020    Medications- reviewed and updated Current Outpatient Medications  Medication Sig Dispense Refill   acetaminophen (TYLENOL) 500 MG tablet Take 1,000 mg by mouth daily as needed.      alfuzosin (UROXATRAL) 10 MG 24 hr tablet Take 10 mg by mouth daily with breakfast.     aspirin EC 81 MG tablet Take 81 mg by mouth daily.     azelastine (ASTELIN) 0.1 % nasal spray Use 1 spray in each nostril daily as needed. 30 mL 5   fish oil-omega-3 fatty acids 1000 MG capsule Take 2 g by mouth daily.     folic acid (FOLVITE) 1 MG tablet TAKE 1 TABLET BY MOUTH  DAILY 90 tablet 3    HYDROcodone-acetaminophen (NORCO/VICODIN) 5-325 MG tablet Take 1 tablet by mouth every 6 (six) hours as needed for moderate pain or severe pain (kidney stones). 5 tablet 0   losartan-hydrochlorothiazide (HYZAAR) 50-12.5 MG tablet TAKE 1 TABLET BY MOUTH DAILY 90 tablet 3   Multiple Vitamin (MULTIVITAMIN WITH MINERALS) TABS tablet Take 1 tablet by mouth daily.     RABEprazole (ACIPHEX) 20 MG tablet TAKE 1 TABLET BY MOUTH  DAILY 90 tablet 3   rosuvastatin (CRESTOR) 10 MG tablet TAKE 1 TABLET BY MOUTH  DAILY 90 tablet 3   traZODone (DESYREL) 50 MG tablet Take 1 tablet (50 mg total) by mouth at bedtime as needed for sleep. 30 tablet 3   No current facility-administered medications for this visit.     Objective:  BP 120/78   Pulse 74   Temp 98.4 F (36.9 C)   Ht '6\' 2"'$  (1.88 m)   Wt 275 lb 6.4 oz (124.9 kg)   SpO2 96%   BMI 35.36 kg/m  Gen: NAD, resting comfortably CV: RRR no murmurs rubs or gallops Lungs: CTAB no crackles, wheeze, rhonchi Ext: trace edema Skin: warm, dry     Assessment and Plan     #Social update-has been a challenging last year but patient has done incredibly well  overall lost brother Wille Glaser july 2022 and father in law aaa stent infection and then just recently lost his mother-incredible woman and lived to 41 despite many odds against her  #Hyperlipidemia S:Compliant with Crestor 10 mg.  Strong family history of cardiovascular disease.  We have opted to continue aspirin as result. Lab Results  Component Value Date   CHOL 90 (L) 08/06/2021   HDL 29 (L) 08/06/2021   LDLCALC 44 08/06/2021   LDLDIRECT 59.0 08/14/2019   TRIG 82 08/06/2021   CHOLHDL 3.1 08/06/2021    A/P: Lipids have been very well controlled-continue current medication-he would like to be very aggressive   #Essential hypertension S: Compliant with alfuzosin, losartan 50 mg, hydrochlorothiazide 12.5 mg BP Readings from Last 3 Encounters:  01/10/22 120/78  08/25/21 110/60  07/06/21 100/60  A/P:  Controlled. Continue current medications.    #Thoracic aortic aneurysm S: From CT angiogram on 08/10/2021 with cardiology Dr. Loyal Buba stable at 69 mm-plan to continue annual follow-up - Avoid quinolones A/P: Doing well/stable-continue current medications   #Nephrolithiasis S: Patient has a few hydrocodone on hand in case has acute flareup to keep him out of emergency room. A/P: no recent issues- still has hydrocodone on hand- willing to refill if needed   % BPH-follows with Dr. Dahlstedt/elevated PSA S: Patient compliant with alfuzosin. Worries about alpha blocker and orthostatic hypotension -if gets very full hard to start and stop stream. Self caths if needs to about once a quarter. If drinks alcohol (rare) may wake up and hard time to start stream.  MRI dec 2020 with PSA over 10 reassuring- biopsies in the past -insomnia issues related to this Lab Results  Component Value Date   PSA 11.01 (H) 07/06/2021   PSA 10.61 (H) 07/02/2020   PSA 10.3 11/09/2019  A/P: Patient would like to update PSA with labs and next visit will be with Dr. Diona Fanti again-for now continue current medication  #Hyperglycemia S: Intermittent mild A1c elevations in the low fives. Weight up 5 lbs from last check but he feels stable-very active. Doesn't snack much but feels could eat healthier snacks more frequently honestly. Already does small plates Lab Results  Component Value Date   HGBA1C 5.8 07/06/2021  A/P: hopefully stable or improved - update a1c today. Continue current meds for now   #GERD S: Compliant with AcipHex.  He is on oral B12. Has not done well with h2 blocker trials in past -more breakthrough in last month or two but a lot of stress- mom dying and remodeling a home for his daughter A/P: hopeful with stress decrease this will improve if not want him to discuss with Dr. Earlean Shawl if needs EGD as well   # Bright red blood streaking on stool S:1-2 months intermittent.  Worse with hard  stools. Could happen then 2-3 weeks in between. Has been more frequent than in the past- had intermittent bleeding in last few years- he has had a lot of stress and doing heavy lifting to help with remodel - 05/07/2015 with 10 year follow up  A/P: This certainly could be related to hemorrhoids but given several years of symptoms and seems to be worsening-we will check CBC and also refer to GI to consider colonoscopy earlier than 10 years-around 7 years out  Recommended follow up: Return in about 6 months (around 07/13/2022) for physical or sooner if needed.Schedule b4 you leave. Future Appointments  Date Time Provider [place] [facility]  07/13/2022  7:35 AM [id number]  07/20/2022  8:20 AM Yong Channel, Brayton [place], MD [id number]    Lab/Order associations:   ICD-10-CM   1. Rectal bleeding  K62.5 Ambulatory referral to Gastroenterology    2. Benign prostatic hyperplasia with lower urinary tract symptoms, symptom details unspecified  N40.1 PSA    3. Hyperglycemia  R73.9 HgB A1c    4. Essential hypertension  I10 Comprehensive metabolic panel      No orders of the defined types were placed in this encounter.   Return precautions advised.  Garret Reddish, MD

## 2022-01-11 LAB — COMPREHENSIVE METABOLIC PANEL
ALT: 29 U/L (ref 0–53)
AST: 20 U/L (ref 0–37)
Albumin: 4.5 g/dL (ref 3.5–5.2)
Alkaline Phosphatase: 80 U/L (ref 39–117)
BUN: 22 mg/dL (ref 6–23)
CO2: 28 mEq/L (ref 19–32)
Calcium: 9.2 mg/dL (ref 8.4–10.5)
Chloride: 102 mEq/L (ref 96–112)
Creatinine, Ser: 0.91 mg/dL (ref 0.40–1.50)
GFR: 93.92 mL/min (ref >60.00)
Glucose, Bld: 99 mg/dL (ref 70–99)
Potassium: 4.3 mEq/L (ref 3.5–5.1)
Sodium: 144 mEq/L (ref 135–145)
Total Bilirubin: 0.5 mg/dL (ref 0.2–1.2)
Total Protein: 6.5 g/dL (ref 6.0–8.3)

## 2022-01-11 LAB — HEMOGLOBIN A1C: Hgb A1c MFr Bld: 5.9 % (ref 4.6–6.5)

## 2022-01-11 LAB — PSA: PSA: 11.61 ng/mL — ABNORMAL HIGH (ref 0.10–4.00)

## 2022-01-21 ENCOUNTER — Other Ambulatory Visit: Payer: Self-pay | Admitting: Urology

## 2022-01-27 ENCOUNTER — Other Ambulatory Visit: Payer: Self-pay

## 2022-01-27 ENCOUNTER — Encounter: Payer: Self-pay | Admitting: Family Medicine

## 2022-01-27 MED ORDER — ROSUVASTATIN CALCIUM 10 MG PO TABS: 10.0000 mg | ORAL_TABLET | Freq: Every day | ORAL | 3 refills | Status: DC

## 2022-02-28 ENCOUNTER — Encounter: Payer: Self-pay | Admitting: *Deleted

## 2022-03-04 ENCOUNTER — Encounter: Payer: Self-pay | Admitting: Family Medicine

## 2022-06-17 ENCOUNTER — Other Ambulatory Visit: Payer: Self-pay | Admitting: Family Medicine

## 2022-07-13 ENCOUNTER — Ambulatory Visit (HOSPITAL_COMMUNITY): Payer: 59 | Attending: Cardiovascular Disease

## 2022-07-13 DIAGNOSIS — I361 Nonrheumatic tricuspid (valve) insufficiency: Secondary | ICD-10-CM

## 2022-07-13 DIAGNOSIS — I712 Thoracic aortic aneurysm, without rupture, unspecified: Secondary | ICD-10-CM

## 2022-07-13 LAB — ECHOCARDIOGRAM COMPLETE
Area-P 1/2: 4.64 cm2
S' Lateral: 2.9 cm

## 2022-07-16 ENCOUNTER — Encounter: Payer: Self-pay | Admitting: Internal Medicine

## 2022-07-20 ENCOUNTER — Encounter: Payer: Self-pay | Admitting: Family Medicine

## 2022-07-20 ENCOUNTER — Ambulatory Visit (INDEPENDENT_AMBULATORY_CARE_PROVIDER_SITE_OTHER): Payer: 59 | Admitting: Family Medicine

## 2022-07-20 VITALS — BP 118/64 | HR 65 | Temp 97.2°F | Ht 74.0 in | Wt 276.8 lb

## 2022-07-20 DIAGNOSIS — Z0000 Encounter for general adult medical examination without abnormal findings: Secondary | ICD-10-CM | POA: Diagnosis not present

## 2022-07-20 DIAGNOSIS — I712 Thoracic aortic aneurysm, without rupture, unspecified: Secondary | ICD-10-CM

## 2022-07-20 DIAGNOSIS — R739 Hyperglycemia, unspecified: Secondary | ICD-10-CM

## 2022-07-20 DIAGNOSIS — R972 Elevated prostate specific antigen [PSA]: Secondary | ICD-10-CM | POA: Diagnosis not present

## 2022-07-20 DIAGNOSIS — E782 Mixed hyperlipidemia: Secondary | ICD-10-CM | POA: Diagnosis not present

## 2022-07-20 LAB — COMPREHENSIVE METABOLIC PANEL
ALT: 26 U/L (ref 0–53)
AST: 17 U/L (ref 0–37)
Albumin: 4.4 g/dL (ref 3.5–5.2)
Alkaline Phosphatase: 73 U/L (ref 39–117)
BUN: 21 mg/dL (ref 6–23)
CO2: 29 mEq/L (ref 19–32)
Calcium: 9.7 mg/dL (ref 8.4–10.5)
Chloride: 104 mEq/L (ref 96–112)
Creatinine, Ser: 0.95 mg/dL (ref 0.40–1.50)
GFR: 88.87 mL/min (ref >60.00)
Glucose, Bld: 95 mg/dL (ref 70–99)
Potassium: 4.4 mEq/L (ref 3.5–5.1)
Sodium: 138 mEq/L (ref 135–145)
Total Bilirubin: 0.7 mg/dL (ref 0.2–1.2)
Total Protein: 6.7 g/dL (ref 6.0–8.3)

## 2022-07-20 LAB — CBC WITH DIFFERENTIAL/PLATELET
Basophils Absolute: 0 10*3/uL (ref 0.0–0.1)
Basophils Relative: 0.8 % (ref 0.0–3.0)
Eosinophils Absolute: 0.1 10*3/uL (ref 0.0–0.7)
Eosinophils Relative: 2.2 % (ref 0.0–5.0)
HCT: 43.6 % (ref 39.0–52.0)
Hemoglobin: 14.9 g/dL (ref 13.0–17.0)
Lymphocytes Relative: 25.2 % (ref 12.0–46.0)
Lymphs Abs: 1.3 10*3/uL (ref 0.7–4.0)
MCHC: 34.2 g/dL (ref 30.0–36.0)
MCV: 86.2 fl (ref 78.0–100.0)
Monocytes Absolute: 0.3 10*3/uL (ref 0.1–1.0)
Monocytes Relative: 6.1 % (ref 3.0–12.0)
Neutro Abs: 3.3 10*3/uL (ref 1.4–7.7)
Neutrophils Relative %: 65.7 % (ref 43.0–77.0)
Platelets: 201 10*3/uL (ref 150.0–400.0)
RBC: 5.06 Mil/uL (ref 4.22–5.81)
RDW: 13.4 % (ref 11.5–15.5)
WBC: 5 10*3/uL (ref 4.0–10.5)

## 2022-07-20 LAB — LIPID PANEL
Cholesterol: 112 mg/dL (ref 0–200)
HDL: 33.6 mg/dL — ABNORMAL LOW (ref >39.00)
LDL Cholesterol: 63 mg/dL (ref 0–99)
NonHDL: 78.29
Total CHOL/HDL Ratio: 3
Triglycerides: 78 mg/dL (ref 0.0–149.0)
VLDL: 15.6 mg/dL (ref 0.0–40.0)

## 2022-07-20 LAB — PSA: PSA: 15.83 ng/mL — ABNORMAL HIGH (ref 0.10–4.00)

## 2022-07-20 LAB — HEMOGLOBIN A1C: Hgb A1c MFr Bld: 5.8 % (ref 4.6–6.5)

## 2022-07-20 NOTE — Patient Instructions (Addendum)
Consider calling back for shingrix for nurse visit or get at pharmacy- can be 1-3 days fever/chills/bodyaches  Team please into [facility] and get last covid date   GI contact Please call to schedule visit and/or procedure Address: [address], [place], [place] [address] Phone: [phone]   Please stop by lab before you go If you have mychart- we will send your results within 3 business days of [place] receiving them.  If you do not have mychart- we will call you about results within 5 business days of [place] receiving them.  *please also note that you will see labs on mychart as soon as they post. I will later go in and write notes on them- will say "notes from Dr. Yong Channel"   Recommended follow up: Return in about 6 months (around 01/18/2023) for followup or sooner if needed.Schedule b4 you leave.

## 2022-07-20 NOTE — Progress Notes (Signed)
Phone: [phone]   Subjective:  Patient presents today for their annual physical. Chief complaint-noted.   See problem oriented charting- ROS- full  review of systems was completed and negative  except for: hearing loss with tinnitus- largely unchanged, reflux, urinary urgency  The following were reviewed and entered/updated in epic: Past Medical History:  Diagnosis Date   Arthritis    BPH (benign prostatic hyperplasia)    GERD (gastroesophageal reflux disease)    Hyperlipidemia    Hypertension    Kidney stone    Plantar fasciitis    cortisone injection   Patient Active Problem List   Diagnosis Date Noted   Thoracic aortic aneurysm without rupture, unspecified part ([place])     Priority: High   Hepatic steatosis 11/03/2020    Priority: Medium    History of melanoma in situ 08/02/2016    Priority: Medium    Hyperglycemia 10/05/2015    Priority: Medium    BPH (benign prostatic hyperplasia) 04/02/2014    Priority: Medium    Family history of peripheral vascular disease 03/17/2011    Priority: Medium    Lipoma of back 07/08/2009    Priority: Medium    Hyperlipidemia 02/26/2007    Priority: Medium    Essential hypertension 02/26/2007    Priority: Medium    Osteoarthritis 02/26/2007    Priority: Medium    Status post bilateral hip replacements 12/04/2015    Priority: Low   Hemangioma of liver 04/02/2014    Priority: Low   Nephrolithiasis 04/02/2014    Priority: Low   GERD 06/18/2007    Priority: Low   INSOMNIA, PERSISTENT 02/26/2007    Priority: Low   Family history of aortic aneurysm 07/14/2020    Priority: 1.   Palpitations 03/05/2021   Pulmonary nodules 11/03/2020   Past Surgical History:  Procedure Laterality Date   BILATERAL ANTERIOR TOTAL HIP ARTHROPLASTY Bilateral 12/04/2015   Procedure: BILATERAL ANTERIOR APPROACH TOTAL HIP ARTHROPLASTY;  Surgeon: Mcarthur Rossetti, MD;  Location: [facility];  Service: Orthopedics;  Laterality: Bilateral;   left eye  surgery     left eye surgery-squint surgery-age 87   none     TONSILLECTOMY     age 53    Family History  Problem Relation Age of Onset   Lung cancer Father        former smoker   Heart disease Father 11       PAD (carotid endarterectomy at 79) and CAD with MI in late 13s early 2s.    Heart disease Sister 34       mi, smoker   Stroke Brother 18       physician who stopped practicing as result. massive stroke, history of a fib and metabolic syndrome, hyperlipidemia   Stroke Paternal Grandfather 60   Crohn's disease Daughter     Medications- reviewed and updated Current Outpatient Medications  Medication Sig Dispense Refill   acetaminophen (TYLENOL) 500 MG tablet Take 1,000 mg by mouth daily as needed.      alfuzosin (UROXATRAL) 10 MG 24 hr tablet Take 10 mg by mouth daily with breakfast.     aspirin EC 81 MG tablet Take 81 mg by mouth daily.     azelastine (ASTELIN) 0.1 % nasal spray Use 1 spray in each nostril daily as needed. 30 mL 5   fish oil-omega-3 fatty acids 1000 MG capsule Take 2 g by mouth daily.     folic acid (FOLVITE) 1 MG tablet TAKE 1 TABLET BY MOUTH DAILY  90 tablet 3   HYDROcodone-acetaminophen (NORCO/VICODIN) 5-325 MG tablet Take 1 tablet by mouth every 6 (six) hours as needed for moderate pain or severe pain (kidney stones). 5 tablet 0   losartan-hydrochlorothiazide (HYZAAR) 50-12.5 MG tablet TAKE 1 TABLET BY MOUTH DAILY 90 tablet 3   Multiple Vitamin (MULTIVITAMIN WITH MINERALS) TABS tablet Take 1 tablet by mouth daily.     RABEprazole (ACIPHEX) 20 MG tablet TAKE 1 TABLET BY MOUTH  DAILY 90 tablet 3   rosuvastatin (CRESTOR) 10 MG tablet Take 1 tablet (10 mg total) by mouth daily. 90 tablet 3   traZODone (DESYREL) 50 MG tablet Take 1 tablet (50 mg total) by mouth at bedtime as needed for sleep. 30 tablet 3   No current facility-administered medications for this visit.    Allergies-reviewed and updated No Known Allergies  Social History   Social History  Narrative   Married 1990. 3 kids- 10-6 years old in 2024. Oldest Son Customer service manager (2nd masters in [place] in 2024) .  Daughter in [place]. Son - coaching in [place] lacrosse- team doing great      Works [employer], Architectural technologist, country Teacher, music   Background as a paramedic   Works at least 60 hours a week      Hobbies: time with dog, outdoors person, renovating/home repair   Objective  Objective:  BP 118/64   Pulse 65   Temp (!) 97.2 F (36.2 C)   Ht 6' 2"$  (1.88 m)   Wt 276 lb 12.8 oz (125.6 kg)   SpO2 95%   BMI 35.54 kg/m  Gen: NAD, resting comfortably HEENT: Mucous membranes are moist. Oropharynx normal Neck: no thyromegaly CV: RRR no murmurs rubs or gallops Lungs: CTAB no crackles, wheeze, rhonchi Abdomen: soft/nontender/nondistended/normal bowel sounds. No rebound or guarding.  Ext: no edema Skin: warm, dry Neuro: grossly normal, moves all extremities, PERRLA    Assessment and Plan  57 y.o. male presenting for annual physical.  Health Maintenance counseling: 1. Anticipatory guidance: Patient counseled regarding regular dental exams -q6 months, eye exams -yearly Dr. Valetta Close,  avoiding smoking and second hand smoke , limiting alcohol to 2 beverages per day - 2 or less per month, no illicit drugs .   2. Risk factor reduction:  Advised patient of need for regular exercise and diet rich and fruits and vegetables to reduce risk of heart attack and stroke.  Exercise- steps >7k on avg per week- gets a lot of those on the weekend- wants to increase. Encouraged some weight training- had done in the past but hard with orthopedic issues Diet/weight management-up 6 lbs form last physical- feels eating episodically is harder- dash and grab big barrier- encouraged more prepared foods for lunch- outside of this eats well -did have one fall in dark with hands fall and protected laptop but broke hand- 5th metacarpal and wrist sprain- healed well Wt  Readings from Last 3 Encounters:  07/20/22 276 lb 12.8 oz (125.6 kg)  01/10/22 275 lb 6.4 oz (124.9 kg)  08/25/21 280 lb (127 kg)  3. Immunizations/screenings/ancillary studies-discussed Shingrix, COVID 19 vaccination , opts out of RSV Immunization History  Administered Date(s) Administered   Influenza Whole 04/10/2009, 02/22/2019   Influenza,inj,Quad PF,6+ Mos 03/29/2013, 02/18/2022   Influenza-Unspecified 04/04/2014, 03/23/2015, 02/19/2016, 02/14/2017, 02/13/2018, 02/18/2020, 02/12/2021   Moderna Covid-19 Vaccine Bivalent Booster 45yr & up 04/01/2021   Moderna Sars-Covid-2 Vaccination 05/29/2019, 06/26/2019, 04/09/2020   PPD Test 04/04/2014   Td 04/06/2006   Tdap 08/02/2016  4. Prostate cancer screening- see discussion below.  Has required self caths in the past but no recent obstruction issues. Needs mid check on psa with [place] and we can forward to Dr. Diona Fanti  Lab Results  Component Value Date   PSA 11.61 (H) 01/10/2022   PSA 11.01 (H) 07/06/2021   PSA 10.61 (H) 07/02/2020   5. Colon cancer screening - 1-/1/16 with 10 year repeat planned with Dr. Earlean Shawl. Still with intermittent bleeding with hemorrhoids at times and we recommended follow-up with Dr. Earlean Shawl at last visit-referral was placed at that time-he reports he retired- he plans to call [facility]- may ask for colonoscopy and EGD  6. Skin cancer screening- Dr. Rolinda Roan every 6 months or at least yearly- ends up seeing Dr. Sarajane Jews at times if needed . advised regular sunscreen use. Denies worrisome, changing, or new skin lesions.  7. Smoking associated screening (lung cancer screening, AAA screen 65-75, UA)- Never smoker. Family history lung cancer but has had CTs- both parents smoked  8. STD screening - only active with wife   Status of chronic or acute concerns   #Hyperlipidemia S:Compliant with Crestor 10 mg.  Strong family history of cardiovascular disease.  We have opted to continue aspirin as result. Lab Results   Component Value Date   CHOL 90 (L) 08/06/2021   HDL 29 (L) 08/06/2021   LDLCALC 44 08/06/2021   LDLDIRECT 59.0 08/14/2019   TRIG 82 08/06/2021   CHOLHDL 3.1 08/06/2021  A/P: lipids have looked great- update today    #Essential hypertension S: Compliant with alfuzosin, losartan 50 mg, hydrochlorothiazide 12.5 mg BP Readings from Last 3 Encounters:  07/20/22 118/64  01/10/22 120/78  08/25/21 110/60  A/P: stable- continue current medicines    #Thoracic aortic aneurysm S: From CT angiogram on 08/10/2020 with cardiology Dr. Gasper Sells "Mild uncomplicated fusiform aneurysmal dilatation of the ascending thoracic aorta measuring 41 mm in diameter. Recommend annual imaging followup by CTA" - was worse on echo and has follow up CT planned in march. He is also looking at baseline cardiac workup if possible- messaged cardiology - Avoid quinolones A/P: slightly worse on echo but could be just difference in imaging modality and has upcoming CT to reevaluate with same standard as last year    #Nephrolithiasis S: Patient has a few hydrocodone on hand in case has acute flareup to keep him out of emergency room. A/P: has done well lately with this thankfully- no recent issues   % BPH-follows with Dr. Dahlstedt/elevated PSA S: Patient compliant with alfuzosin MRI dec 2020 with PSA over 10 reassuring.  Has had biopsies in the past as well A/P: stable- continue current medicines    #Hyperglycemia S: Intermittent mild A1c elevations in the low fives. Lab Results  Component Value Date   HGBA1C 5.9 01/10/2022   HGBA1C 5.8 07/06/2021   HGBA1C 5.8 07/02/2020  A/P: hopefully stable or improved update a1c today. Continue current meds for now    #GERD S: Compliant with AcipHex but was out for 60 days- took nexium. Just got the aciphex back. Had worsening symptoms while off the aciphex.   Refer to GI DR. Earlean Shawl last visit but he retired. Now plans to see GI A/P: doing better back on aciphex but  if symptoms fail to go back to baseline may need EGD when discusses with GI   #Fatty liver noted on prior imaging- Encouraged need for healthy eating, regular exercise, weight loss.     Other notes: 1.  Bilateral hip  replacements 2018- hips doing well  Recommended follow up: Return in about 6 months (around 01/18/2023) for followup or sooner if needed.Schedule b4 you leave.  Lab/Order associations: fasting   ICD-10-CM   1. Preventative health care  Z00.00 CBC with Differential/Platelet    Comprehensive metabolic panel    Lipid panel    HgB A1c    PSA    2. Mixed hyperlipidemia  E78.2 CBC with Differential/Platelet    Comprehensive metabolic panel    Lipid panel    3. Hyperglycemia  R73.9 HgB A1c    4. Thoracic aortic aneurysm without rupture, unspecified part ([place]) Chronic I71.20     5. Elevated PSA  R97.20 PSA      No orders of the defined types were placed in this encounter.  Return precautions advised.  Andrew Reddish, MD

## 2022-07-22 ENCOUNTER — Telehealth: Payer: Self-pay | Admitting: Internal Medicine

## 2022-07-22 DIAGNOSIS — I712 Thoracic aortic aneurysm, without rupture, unspecified: Secondary | ICD-10-CM

## 2022-07-22 DIAGNOSIS — R072 Precordial pain: Secondary | ICD-10-CM

## 2022-07-22 MED ORDER — METOPROLOL TARTRATE 50 MG PO TABS: 50.0000 mg | ORAL_TABLET | Freq: Once | ORAL | 0 refills | Status: DC

## 2022-07-22 NOTE — Telephone Encounter (Signed)
Left a message to call back.  Need to assess if pt is having CP.  If so will order Cardiac Ct and CT aorta per MD.

## 2022-07-22 NOTE — Telephone Encounter (Signed)
Called pt reviewed Echo results.  Pt reports is having chest discomfort does not know if this r/t to GERD or heart.  Also, reports has a strong family history of Cardiac issues. Advised pt MD would like to order a Cardiac CT and CT Aorta.  Reviewed Cardiac CT instructions with pt and placed on my chart for review.

## 2022-07-22 NOTE — Telephone Encounter (Signed)
Pt returning nurses call regarding Echo results. Please advise

## 2022-07-29 ENCOUNTER — Telehealth (HOSPITAL_COMMUNITY): Payer: Self-pay | Admitting: *Deleted

## 2022-07-29 NOTE — Telephone Encounter (Signed)
Reaching out to patient to offer assistance regarding upcoming cardiac imaging study; pt verbalizes understanding of appt date/time, parking situation and where to check in, pre-test NPO status and medications ordered, and verified current allergies; name and call back number provided for further questions should they arise  Gordy Clement RN Navigator Cardiac Imaging [facility] [phone] office [phone] cell  Patient to take '25mg'$  metoprolol tartrate two hours prior to his cardiac CT scan if HR greater than 65bpm.

## 2022-07-30 ENCOUNTER — Other Ambulatory Visit: Payer: Self-pay | Admitting: Family Medicine

## 2022-08-02 ENCOUNTER — Ambulatory Visit (HOSPITAL_BASED_OUTPATIENT_CLINIC_OR_DEPARTMENT_OTHER)
Admission: RE | Admit: 2022-08-02 | Discharge: 2022-08-02 | Disposition: A | Discharge status: Home / Self Care | Payer: 59 | Source: Ambulatory Visit | Attending: Internal Medicine | Admitting: Internal Medicine

## 2022-08-02 ENCOUNTER — Ambulatory Visit (HOSPITAL_COMMUNITY)
Admission: RE | Admit: 2022-08-02 | Discharge: 2022-08-02 | Disposition: A | Discharge status: Home / Self Care | Payer: 59 | Source: Ambulatory Visit | Attending: Internal Medicine | Admitting: Internal Medicine

## 2022-08-02 ENCOUNTER — Other Ambulatory Visit: Payer: Self-pay | Admitting: Internal Medicine

## 2022-08-02 DIAGNOSIS — I2510 Atherosclerotic heart disease of native coronary artery without angina pectoris: Secondary | ICD-10-CM

## 2022-08-02 DIAGNOSIS — R931 Abnormal findings on diagnostic imaging of heart and coronary circulation: Secondary | ICD-10-CM

## 2022-08-02 DIAGNOSIS — R072 Precordial pain: Secondary | ICD-10-CM | POA: Diagnosis present

## 2022-08-02 DIAGNOSIS — I712 Thoracic aortic aneurysm, without rupture, unspecified: Secondary | ICD-10-CM | POA: Diagnosis present

## 2022-08-02 MED ORDER — NITROGLYCERIN 0.4 MG SL SUBL
0.8000 mg | SUBLINGUAL_TABLET | Freq: Once | SUBLINGUAL | Status: AC
  Administered 2022-08-02: 0.8 mg via SUBLINGUAL

## 2022-08-02 MED ORDER — NITROGLYCERIN 0.4 MG SL SUBL
SUBLINGUAL_TABLET | SUBLINGUAL | Status: AC
  Filled 2022-08-02: qty 2

## 2022-08-02 MED ORDER — IOHEXOL 350 MG/ML SOLN
100.0000 mL | Freq: Once | INTRAVENOUS | Status: AC | PRN
  Administered 2022-08-02: 100 mL via INTRAVENOUS

## 2022-09-28 ENCOUNTER — Other Ambulatory Visit: Payer: Self-pay | Admitting: Family Medicine

## 2022-10-17 ENCOUNTER — Other Ambulatory Visit: Payer: Self-pay | Admitting: Family Medicine

## 2022-10-19 ENCOUNTER — Telehealth: Payer: Self-pay | Admitting: Gastroenterology

## 2022-10-19 NOTE — Telephone Encounter (Signed)
Good morning Dr. Tomasa Rand,   Supervising Provider 10/19/22 AM   We received a call from this patient wishing to schedule an appointment for intermittent rectal bleeding. Patient does have GI Hx with [facility] with Dr. Kinnie Scales. Patient had colonoscopy in 05/2015. Since then Dr. Kinnie Scales has now retired and patient would like to be established with [facility]. Records from procedure are in Epic for your review. Would you please review and advise on scheduling?   Thank you.

## 2022-10-19 NOTE — Progress Notes (Deleted)
Office Visit    Patient Name: Andrew Wilkins Date of Encounter: 10/19/2022  Primary Care Provider:  Shelva Majestic, MD Primary Cardiologist:  Christell Constant, MD Primary Electrophysiologist: None   Past Medical History    Past Medical History:  Diagnosis Date   Arthritis    BPH (benign prostatic hyperplasia)    GERD (gastroesophageal reflux disease)    Hyperlipidemia    Hypertension    Kidney stone    Plantar fasciitis    cortisone injection   Past Surgical History:  Procedure Laterality Date   BILATERAL ANTERIOR TOTAL HIP ARTHROPLASTY Bilateral 12/04/2015   Procedure: BILATERAL ANTERIOR APPROACH TOTAL HIP ARTHROPLASTY;  Surgeon: Kathryne Hitch, MD;  Location: [facility];  Service: Orthopedics;  Laterality: Bilateral;   left eye surgery     left eye surgery-squint surgery-age 61   none     TONSILLECTOMY     age 66    Allergies  No Known Allergies   History of Present Illness    Andrew Wilkins  is a 57 year old male with a PMH of nonobstructive CAD, HTN, HLD, family history of PAD, thoracic aortic aneurysm (41 mm), hepatic stenosis, GERD, BPH, arthritis who presents today for annual follow-up.  Mr. Andrew Wilkins was initially seen by Dr. Raynelle Wilkins on 07/2020 for evaluation of shortness of breath.  Patient endorsed shortness of breath with midsternal burning. He also underwent a 2D echo that showed EF of 55 to 60% with no RWMA and normal RV systolic function with mild elevated PA pressure and mildly dilated RA/LA with trivial MVR and moderate aortic dilation measuring 46 mm. He then underwent a CT angio due to family history of aortic aneurysm that showed mild fusiform aneurysmal dilation of 41 mm.  He was recommended to have yearly CTs for surveillance.  He was seen in 02/2021 with complaint of palpitations.  He was placed on 2-week lab monitor to rule out AF or SVT that revealed no evidence of atrial fibrillation but yielded some SVT.  He was provided the  option of starting medication and deferred at that time.  He was last seen on 08/2021 for annual follow-up and was doing well with no recurrent palpitations and blood pressure was well-controlled.  He underwent a 2D echo on 07/2022 that showed slight dilation of the ascending aorta at 46 mm.  He underwent a CT to reevaluate that showed aortic measurement of 4.1 cm with calcium score of 26 and mixed atherosclerotic plaque in the mid RCA (25-49%) and the PDA and less than 25% stenosis in the LAD, 25-49% stenosis in left circumflex.  FFR was completed and showed no significant stenosis with recommendations to continue secondary prevention.  Since last being seen in the office patient reports***.  Patient denies chest pain, palpitations, dyspnea, PND, orthopnea, nausea, vomiting, dizziness, syncope, edema, weight gain, or early satiety.   ***Notes: -Patient had recent CT and CT of aorta for surveillance of ascending thoracic aneurysm -Abstain from fluoroquinolones Home Medications    Current Outpatient Medications  Medication Sig Dispense Refill   acetaminophen (TYLENOL) 500 MG tablet Take 1,000 mg by mouth daily as needed.      alfuzosin (UROXATRAL) 10 MG 24 hr tablet Take 10 mg by mouth daily with breakfast.     aspirin EC 81 MG tablet Take 81 mg by mouth daily.     azelastine (ASTELIN) 0.1 % nasal spray Use 1 spray in each nostril daily as needed. 30 mL 5  fish oil-omega-3 fatty acids 1000 MG capsule Take 2 g by mouth daily.     folic acid (FOLVITE) 1 MG tablet TAKE 1 TABLET BY MOUTH DAILY 90 tablet 3   HYDROcodone-acetaminophen (NORCO/VICODIN) 5-325 MG tablet Take 1 tablet by mouth every 6 (six) hours as needed for moderate pain or severe pain (kidney stones). 5 tablet 0   losartan-hydrochlorothiazide (HYZAAR) 50-12.5 MG tablet TAKE 1 TABLET BY MOUTH DAILY 90 tablet 3   metoprolol tartrate (LOPRESSOR) 50 MG tablet Take 1 tablet (50 mg total) by mouth once for 1 dose. Take 2 hours prior to Cardiac  CT 1 tablet 0   Multiple Vitamin (MULTIVITAMIN WITH MINERALS) TABS tablet Take 1 tablet by mouth daily.     RABEprazole (ACIPHEX) 20 MG tablet TAKE 1 TABLET BY MOUTH DAILY 90 tablet 3   rosuvastatin (CRESTOR) 10 MG tablet TAKE 1 TABLET BY MOUTH DAILY 90 tablet 3   traZODone (DESYREL) 50 MG tablet Take 1 tablet (50 mg total) by mouth at bedtime as needed for sleep. 30 tablet 3   No current facility-administered medications for this visit.     Review of Systems  Please see the history of present illness.    (+)*** (+)***  All other systems reviewed and are otherwise negative except as noted above.  Physical Exam    Wt Readings from Last 3 Encounters:  07/20/22 276 lb 12.8 oz (125.6 kg)  01/10/22 275 lb 6.4 oz (124.9 kg)  08/25/21 280 lb (127 kg)   ZO:XWRUE were no vitals filed for this visit.,There is no height or weight on file to calculate BMI.  Constitutional:      Appearance: Healthy appearance. Not in distress.  Neck:     Vascular: JVD normal.  Pulmonary:     Effort: Pulmonary effort is normal.     Breath sounds: No wheezing. No rales. Diminished in the bases Cardiovascular:     Normal rate. Regular rhythm. Normal S1. Normal S2.      Murmurs: There is no murmur.  Edema:    Peripheral edema absent.  Abdominal:     Palpations: Abdomen is soft non tender. There is no hepatomegaly.  Skin:    General: Skin is warm and dry.  Neurological:     General: No focal deficit present.     Mental Status: Alert and oriented to person, place and time.     Cranial Nerves: Cranial nerves are intact.  EKG/LABS/ Recent Cardiac Studies    ECG personally reviewed by me today - ***  Cardiac Studies & Procedures       ECHOCARDIOGRAM  ECHOCARDIOGRAM COMPLETE 07/13/2022  Narrative ECHOCARDIOGRAM REPORT    Patient Name:   Andrew Wilkins Date of Exam: 07/13/2022 Medical Rec #:  [id number]        Height:       74.0 in Accession #:    [id number]       Weight:       275.4 lb Date of  Birth:  Apr 12, 1966        BSA:          2.490 m Patient Age:    56 years         BP:           110/60 mmHg Patient Gender: M                HR:           56 bpm. Exam Location:  [facility]  Procedure:  2D Echo, 3D Echo, Cardiac Doppler, Color Doppler and Strain Analysis  Indications:    I71.20 Thoracic Aortic Aneurysm  History:        Patient has prior history of Echocardiogram examinations, most recent 08/10/2020. Risk Factors:Hypertension and Dyslipidemia.  Sonographer:    Daphine Deutscher RDCS Referring Phys: [id number] [facility] CHANDRASEKHAR  IMPRESSIONS   1. Left ventricular ejection fraction, by estimation, is 55 to 60%. The left ventricle has normal function. The left ventricle has no regional wall motion abnormalities. Left ventricular diastolic parameters were normal. The average left ventricular global longitudinal strain is -22.5 %. The global longitudinal strain is normal. 2. Right ventricular systolic function is normal. The right ventricular size is normal. There is normal pulmonary artery systolic pressure. 3. Left atrial size was mildly dilated. 4. Right atrial size was mildly dilated. 5. The mitral valve is normal in structure. Trivial mitral valve regurgitation. No evidence of mitral stenosis. 6. The aortic valve is tricuspid. Aortic valve regurgitation is trivial. No aortic stenosis is present. 7. Aortic dilatation noted. There is mild dilatation of the aortic root, measuring 38 mm. There is moderate dilatation of the ascending aorta, measuring 46 mm. 8. The inferior vena cava is normal in size with greater than 50% respiratory variability, suggesting right atrial pressure of 3 mmHg.  FINDINGS Left Ventricle: Left ventricular ejection fraction, by estimation, is 55 to 60%. The left ventricle has normal function. The left ventricle has no regional wall motion abnormalities. The average left ventricular global longitudinal strain is -22.5 %. The global longitudinal  strain is normal. The left ventricular internal cavity size was normal in size. There is no left ventricular hypertrophy. Left ventricular diastolic parameters were normal.  Right Ventricle: The right ventricular size is normal. No increase in right ventricular wall thickness. Right ventricular systolic function is normal. There is normal pulmonary artery systolic pressure. The tricuspid regurgitant velocity is 2.34 m/s, and with an assumed right atrial pressure of 3 mmHg, the estimated right ventricular systolic pressure is 24.9 mmHg.  Left Atrium: Left atrial size was mildly dilated.  Right Atrium: Right atrial size was mildly dilated.  Pericardium: There is no evidence of pericardial effusion.  Mitral Valve: The mitral valve is normal in structure. Trivial mitral valve regurgitation. No evidence of mitral valve stenosis.  Tricuspid Valve: The tricuspid valve is normal in structure. Tricuspid valve regurgitation is mild . No evidence of tricuspid stenosis.  Aortic Valve: The aortic valve is tricuspid. Aortic valve regurgitation is trivial. No aortic stenosis is present.  Pulmonic Valve: The pulmonic valve was normal in structure. Pulmonic valve regurgitation is trivial. No evidence of pulmonic stenosis.  Aorta: Aortic dilatation noted. There is mild dilatation of the aortic root, measuring 38 mm. There is moderate dilatation of the ascending aorta, measuring 46 mm.  Venous: The inferior vena cava is normal in size with greater than 50% respiratory variability, suggesting right atrial pressure of 3 mmHg.  IAS/Shunts: No atrial level shunt detected by color flow Doppler.   LEFT VENTRICLE PLAX 2D LVIDd:         4.20 cm   Diastology LVIDs:         2.90 cm   LV e' medial:    8.16 cm/s LV PW:         1.00 cm   LV E/e' medial:  11.8 LV IVS:        1.00 cm   LV e' lateral:   10.20 cm/s LVOT diam:  2.20 cm   LV E/e' lateral: 9.4 LV SV:         94 LV SV Index:   38        2D Longitudinal  Strain LVOT Area:     3.80 cm  2D Strain GLS (A2C):   -22.4 % 2D Strain GLS (A3C):   -22.3 % 2D Strain GLS (A4C):   -22.7 % 2D Strain GLS Avg:     -22.5 %  3D Volume EF: 3D EF:        57 % LV EDV:       154 ml LV ESV:       66 ml LV SV:        87 ml  RIGHT VENTRICLE             IVC RV Basal diam:  3.80 cm     IVC diam: 1.70 cm RV S prime:     15.50 cm/s TAPSE (M-mode): 2.9 cm  LEFT ATRIUM             Index        RIGHT ATRIUM           Index LA diam:        5.40 cm 2.17 cm/m   RA Area:     24.10 cm LA Vol (A2C):   45.9 ml 18.43 ml/m  RA Volume:   78.80 ml  31.65 ml/m LA Vol (A4C):   73.1 ml 29.36 ml/m LA Biplane Vol: 58.7 ml 23.57 ml/m AORTIC VALVE LVOT Vmax:   106.50 cm/s LVOT Vmean:  70.000 cm/s LVOT VTI:    0.247 m  AORTA Ao Root diam: 3.80 cm Ao Asc diam:  4.40 cm  MITRAL VALVE               TRICUSPID VALVE MV Area (PHT): 4.64 cm    TR Peak grad:   21.9 mmHg MV Decel Time: 164 msec    TR Vmax:        234.00 cm/s MV E velocity: 95.90 cm/s MV A velocity: 75.50 cm/s  SHUNTS MV E/A ratio:  1.27        Systemic VTI:  0.25 m Systemic Diam: 2.20 cm  Chilton Si MD Electronically signed by Chilton Si MD Signature Date/Time: 07/13/2022/10:22:42 AM    Final    MONITORS  LONG TERM MONITOR (3-14 DAYS) 04/11/2021  Narrative  Patient had a minimum heart rate of 40 bpm, maximum heart rate of 148 bpm, and average heart rate of 67 bpm.  Predominant underlying rhythm was sinus rhythm.  Many short runs of supraventricular tachycardia occurred lasting 20 beats at longest with a max rate of 133 bpm at fastest.  Isolated PACs were frequent (7%).  Isolated PVCs were rare (<1.0%).  Triggered and diary events associated with sinus rhythm, PACs, and PVCs.   Asymptomatic short runs of paroxysmal SVT (consistent with atrial tachycardia).   CT SCANS  CT CORONARY MORPH W/CTA COR W/SCORE 08/04/2022  Addendum 08/04/2022  8:53 AM ADDENDUM REPORT: 08/04/2022  08:51  EXAM: OVER-READ INTERPRETATION  CT CHEST  The following report is an over-read performed by radiologist Dr. Karle Barr [facility] on 08/04/2022. This over-read does not include interpretation of cardiac or coronary anatomy or pathology. The coronary calcium score and aortic CTA interpretation by the cardiologist is attached.  COMPARISON:  None.  FINDINGS: Aneurysmal dilatation ascending thoracic aorta 4.1 cm transverse. No evidence of aortic dissection. No pericardial effusion. Calcified lymph nodes RIGHT  hilum with calcified granuloma RIGHT upper lobe. Esophagus unremarkable. Visualized liver normal appearance. Remaining lungs clear. No osseous abnormalities.  IMPRESSION: Old granulomatous disease.  Aneurysmal dilatation ascending thoracic aorta 4.1 cm transverse; Recommend annual imaging followup by CTA or MRA. This recommendation follows 2010 ACCF/AHA/AATS/ACR/ASA/SCA/SCAI/SIR/STS/SVM Guidelines for the Diagnosis and Management of Patients with Thoracic Aortic Disease. Circulation. 2010; 121: Y782-N562. Aortic aneurysm NOS (ICD10-I71.9)  Aortic aneurysm NOS (ICD10-I71.9).   Electronically Signed By: Ulyses Southward M.D. On: 08/04/2022 08:51  Narrative HISTORY: Chest pain, nonspecific  EXAM: Cardiac/Coronary  CT  TECHNIQUE: The patient was scanned on a [employer].  PROTOCOL: A 120 kV prospective scan was triggered in the descending thoracic aorta at 111 HU's. Axial non-contrast 3 mm slices were carried out through the heart. The data set was analyzed on a dedicated work station and scored using the Agatston method. Gantry rotation speed was 250 msecs and collimation was .6 mm. Beta blockade and 0.8 mg of sl NTG was given. The 3D data set was reconstructed in 5% intervals of the 35-75 % of the R-R cycle. Systolic and diastolic phases were analyzed on a dedicated work station using MPR, MIP and VRT modes. The patient received  contrast: OMNIPAQUE IOHEXOL 350 MG/ML SOLN.  FINDINGS: Image quality: Average  Noise artifact is: Moderately reduced SNR and mildly reduced contrast density due to scan protocol.  Coronary calcium score is 26, which places the patient in the 57th percentile for age and sex matched control.  Coronary arteries: Normal coronary origins.  Right dominance.  Right Coronary Artery: Minimal mixed atherosclerotic plaque in the mid RCA, <25% stenosis. Mild-moderate mixed atherosclerotic plaque in the proximal PDA, 25-49% stenosis. PDA is >2 mm distal to this lesion.  Left Main Coronary Artery: No detectable plaque or stenosis.  Left Anterior Descending Coronary Artery: Minimal mixed atherosclerotic plaque in the proximal LAD, <25% stenosis. Distal LAD not well visualized due to image quality. Grossly normal D1 and D2.  Left Circumflex Artery: Possible mild mixed atherosclerotic plaque in the proximal LCx, 25-49% stenosis. Vessel is small caliber making exact assessment challenging.  Aorta: Mild dilation, 43 mm at the mid ascending aorta (level of the PA bifurcation) measured double oblique.  Sinus of Valsalva 37 x 38 x 37 mm,  normal for age and BSA 2.49.  Aortic Valve: Trivial calcifications.  Valve poorly visualized.  Other findings:  Normal pulmonary vein drainage into the left atrium.  Normal left atrial appendage without thrombus.  Mild dilation of main pulmonary artery, 30 mm  Please see separate report from [facility] for non-cardiac findings.  IMPRESSION: 1. Mild-moderate CAD in proximal PDA, 25-49% stenosis, CADRADS 2. CT FFR will be performed and reported separately to fully define PDA stenosis. Otherwise, minimal CAD. 2. Coronary calcium score is 26, which places the patient in the 57th percentile for age and sex matched control. 3. Normal coronary origins with right dominance. 4. Mild dilation of mid ascending aorta, 43  mm.  RECOMMENDATIONS: CAD-RADS 2. Mild non-obstructive CAD (25-49%). Consider non-atherosclerotic causes of chest pain. Consider preventive therapy and risk factor modification.  Electronically Signed: By: Weston Brass M.D. On: 08/02/2022 12:25          Risk Assessment/Calculations:   {Does this patient have ATRIAL FIBRILLATION?:[phone]}        Lab Results  Component Value Date   WBC 5.0 07/20/2022   HGB 14.9 07/20/2022   HCT 43.6 07/20/2022   MCV 86.2 07/20/2022   PLT 201.0 07/20/2022  Lab Results  Component Value Date   CREATININE 0.95 07/20/2022   BUN 21 07/20/2022   NA 138 07/20/2022   K 4.4 07/20/2022   CL 104 07/20/2022   CO2 29 07/20/2022   Lab Results  Component Value Date   ALT 26 07/20/2022   AST 17 07/20/2022   ALKPHOS 73 07/20/2022   BILITOT 0.7 07/20/2022   Lab Results  Component Value Date   CHOL 112 07/20/2022   HDL 33.60 (L) 07/20/2022   LDLCALC 63 07/20/2022   LDLDIRECT 59.0 08/14/2019   TRIG 78.0 07/20/2022   CHOLHDL 3 07/20/2022    Lab Results  Component Value Date   HGBA1C 5.8 07/20/2022     Assessment & Plan    1.  Nonobstructive CAD: -Patient completed cardiac CTA for surveillance of ascending aortic aneurysm that showed calcium score of 26 with mixed atherosclerotic plaque in coronaries.  FFR was completed and showed no flow-limiting stenosis. -Today patient reports*** -Continue GDMT with***  2.  Ascending thoracic aortic aneurysm: -Patient has family history and underwent CT angio of the chest in 08/2020 yielding fusiform aortic aneurysm of 41 mm with annual surveillance and most recent CT yielding mild dilation at 43 mm. -Continue*** -Patient advised to avoid fluoroquinolones***  3.  Essential hypertension: -Patient blood pressure today was*** -Continue***  4.  Hyperlipidemia: -Patient's last LDL was*** -Continue***  5.  Morbid obesity: -Patient's BMI is***      Disposition: Follow-up with Christell Constant, MD or APP in *** months {Are you ordering a CV Procedure (e.g. stress test, cath, DCCV, TEE, etc)?   Press F2        :[id number]}   Medication Adjustments/Labs and Tests Ordered: Current medicines are reviewed at length with the patient today.  Concerns regarding medicines are outlined above.   Signed, Napoleon Form, Leodis Rains, NP 10/19/2022, 10:21 AM [facility]

## 2022-10-20 ENCOUNTER — Ambulatory Visit: Payer: 59 | Admitting: Nurse Practitioner

## 2022-10-20 DIAGNOSIS — I2510 Atherosclerotic heart disease of native coronary artery without angina pectoris: Secondary | ICD-10-CM

## 2022-10-20 DIAGNOSIS — I10 Essential (primary) hypertension: Secondary | ICD-10-CM

## 2022-10-20 DIAGNOSIS — E782 Mixed hyperlipidemia: Secondary | ICD-10-CM

## 2022-10-20 DIAGNOSIS — I712 Thoracic aortic aneurysm, without rupture, unspecified: Secondary | ICD-10-CM

## 2022-10-26 NOTE — Telephone Encounter (Signed)
LM for patient to call back and schedule OV.

## 2022-11-07 NOTE — Progress Notes (Unsigned)
Office Visit    Patient Name: Andrew Wilkins Date of Encounter: 11/07/2022  Primary Care Provider:  Shelva Majestic, MD Primary Cardiologist:  Christell Constant, MD Primary Electrophysiologist: None   Past Medical History    Past Medical History:  Diagnosis Date   Arthritis    BPH (benign prostatic hyperplasia)    GERD (gastroesophageal reflux disease)    Hyperlipidemia    Hypertension    Kidney stone    Plantar fasciitis    cortisone injection   Past Surgical History:  Procedure Laterality Date   BILATERAL ANTERIOR TOTAL HIP ARTHROPLASTY Bilateral 12/04/2015   Procedure: BILATERAL ANTERIOR APPROACH TOTAL HIP ARTHROPLASTY;  Surgeon: Kathryne Hitch, MD;  Location: [facility];  Service: Orthopedics;  Laterality: Bilateral;   left eye surgery     left eye surgery-squint surgery-age 81   none     TONSILLECTOMY     age 34    Allergies  No Known Allergies   History of Present Illness    Andrew Wilkins  is a 57 year old male with a PMH of nonobstructive CAD, HTN, HLD, BPH, family history of PAD who presents today for 1 year follow-up.  [name] was seen initially by Dr. Raynelle Jan on 07/14/2020 for management of primary and secondary prevention.  He has a family history of aortic aneurysm and was scheduled for CT angio and follow-up 2D echo.  CTA showed mild TAA of 41 mm and a punctate 3 mm noncalcified nodule.  2D echo showed EF of 55 to 60% with no RWMA and mildly elevated PA pressures with mildly dilated LA/RA.  He was seen in follow-up 03/05/2021 and had complaint of palpitations and more a 2-week event monitor that showed asymptomatic SVT and patient was started on Cardizem 120 mg daily.  He was last seen on 08/2021 for follow-up visit. During visit patient was doing better with no complaints of palpitations.  He underwent CT of the chest for surveillance on 08/02/2022 that showed stable aneurysm at 41 mm and coronary calcium score of 26 with minimal mixed  atherosclerotic plaque in RCA and 25-49% stenosis in proximal PDA.  FFR was completed showing no significant flow-limiting lesions.  [name] presents today for 1 year follow-up.  Since last being seen in the office patient reports that he has been well with no new cardiac complaints since his previous visit.  He did report an episode of midsternal chest pressure that was evaluated to running out of his PPI.  This pain resolved once PPI was resumed with no residual effects.  He also notes tachycardia with increased activity that is not associated with any dizziness or syncope.  He is active and enjoys hiking with his son and also is an EMT by trade.  He has a significant past medical history and family history of coronary disease.  During today's visit we discussed screening for his family such as calcium scoring and LP(a).  We also discussed and reviewed the results of his most recent CT of the chest and patient had all questions answered to satisfaction.  Patient denies chest pain, palpitations, dyspnea, PND, orthopnea, nausea, vomiting, dizziness, syncope, edema, weight gain, or early satiety.   Home Medications    Current Outpatient Medications  Medication Sig Dispense Refill   acetaminophen (TYLENOL) 500 MG tablet Take 1,000 mg by mouth daily as needed.      alfuzosin (UROXATRAL) 10 MG 24 hr tablet Take 10 mg by mouth daily with breakfast.  aspirin EC 81 MG tablet Take 81 mg by mouth daily.     azelastine (ASTELIN) 0.1 % nasal spray Use 1 spray in each nostril daily as needed. 30 mL 5   fish oil-omega-3 fatty acids 1000 MG capsule Take 2 g by mouth daily.     folic acid (FOLVITE) 1 MG tablet TAKE 1 TABLET BY MOUTH DAILY 90 tablet 3   HYDROcodone-acetaminophen (NORCO/VICODIN) 5-325 MG tablet Take 1 tablet by mouth every 6 (six) hours as needed for moderate pain or severe pain (kidney stones). 5 tablet 0   losartan-hydrochlorothiazide (HYZAAR) 50-12.5 MG tablet TAKE 1 TABLET BY MOUTH DAILY  90 tablet 3   metoprolol tartrate (LOPRESSOR) 50 MG tablet Take 1 tablet (50 mg total) by mouth once for 1 dose. Take 2 hours prior to Cardiac CT 1 tablet 0   Multiple Vitamin (MULTIVITAMIN WITH MINERALS) TABS tablet Take 1 tablet by mouth daily.     RABEprazole (ACIPHEX) 20 MG tablet TAKE 1 TABLET BY MOUTH DAILY 90 tablet 3   rosuvastatin (CRESTOR) 10 MG tablet TAKE 1 TABLET BY MOUTH DAILY 90 tablet 3   traZODone (DESYREL) 50 MG tablet Take 1 tablet (50 mg total) by mouth at bedtime as needed for sleep. 30 tablet 3   No current facility-administered medications for this visit.     Review of Systems  Please see the history of present illness.    (+) Tachycardia (+) Fatigue with heavy exertion  All other systems reviewed and are otherwise negative except as noted above.  Physical Exam    Wt Readings from Last 3 Encounters:  07/20/22 276 lb 12.8 oz (125.6 kg)  01/10/22 275 lb 6.4 oz (124.9 kg)  08/25/21 280 lb (127 kg)   ZO:XWRUE were no vitals filed for this visit.,There is no height or weight on file to calculate BMI.  Constitutional:      Appearance: Healthy appearance. Not in distress.  Neck:     Vascular: JVD normal.  Pulmonary:     Effort: Pulmonary effort is normal.     Breath sounds: No wheezing. No rales. Diminished in the bases Cardiovascular:     Normal rate. Regular rhythm. Normal S1. Normal S2.      Murmurs: Soft systolic murmur  Edema:    Trace bilateral edema Abdominal:     Palpations: Abdomen is soft non tender. There is no hepatomegaly.  Skin:    General: Skin is warm and dry.  Neurological:     General: No focal deficit present.     Mental Status: Alert and oriented to person, place and time.     Cranial Nerves: Cranial nerves are intact.  EKG/LABS/ Recent Cardiac Studies    ECG personally reviewed by me today - sinus rhythm with PAC and rate of 54 bpm with no acute changes consistent with previous EKG.  Cardiac Studies & Procedures        ECHOCARDIOGRAM  ECHOCARDIOGRAM COMPLETE 07/13/2022  Narrative ECHOCARDIOGRAM REPORT    Patient Name:   Andrew Wilkins Date of Exam: 07/13/2022 Medical Rec #:  [id number]        Height:       74.0 in Accession #:    [id number]       Weight:       275.4 lb Date of Birth:  Jul 08, 1965        BSA:          2.490 m Patient Age:    64 years  BP:           110/60 mmHg Patient Gender: M                HR:           56 bpm. Exam Location:  [address]  Procedure: 2D Echo, 3D Echo, Cardiac Doppler, Color Doppler and Strain Analysis  Indications:    I71.20 Thoracic Aortic Aneurysm  History:        Patient has prior history of Echocardiogram examinations, most recent 08/10/2020. Risk Factors:Hypertension and Dyslipidemia.  Sonographer:    Daphine Deutscher RDCS Referring Phys: [id number] [facility] A CHANDRASEKHAR  IMPRESSIONS   1. Left ventricular ejection fraction, by estimation, is 55 to 60%. The left ventricle has normal function. The left ventricle has no regional wall motion abnormalities. Left ventricular diastolic parameters were normal. The average left ventricular global longitudinal strain is -22.5 %. The global longitudinal strain is normal. 2. Right ventricular systolic function is normal. The right ventricular size is normal. There is normal pulmonary artery systolic pressure. 3. Left atrial size was mildly dilated. 4. Right atrial size was mildly dilated. 5. The mitral valve is normal in structure. Trivial mitral valve regurgitation. No evidence of mitral stenosis. 6. The aortic valve is tricuspid. Aortic valve regurgitation is trivial. No aortic stenosis is present. 7. Aortic dilatation noted. There is mild dilatation of the aortic root, measuring 38 mm. There is moderate dilatation of the ascending aorta, measuring 46 mm. 8. The inferior vena cava is normal in size with greater than 50% respiratory variability, suggesting right atrial pressure of 3  mmHg.  FINDINGS Left Ventricle: Left ventricular ejection fraction, by estimation, is 55 to 60%. The left ventricle has normal function. The left ventricle has no regional wall motion abnormalities. The average left ventricular global longitudinal strain is -22.5 %. The global longitudinal strain is normal. The left ventricular internal cavity size was normal in size. There is no left ventricular hypertrophy. Left ventricular diastolic parameters were normal.  Right Ventricle: The right ventricular size is normal. No increase in right ventricular wall thickness. Right ventricular systolic function is normal. There is normal pulmonary artery systolic pressure. The tricuspid regurgitant velocity is 2.34 m/s, and with an assumed right atrial pressure of 3 mmHg, the estimated right ventricular systolic pressure is 24.9 mmHg.  Left Atrium: Left atrial size was mildly dilated.  Right Atrium: Right atrial size was mildly dilated.  Pericardium: There is no evidence of pericardial effusion.  Mitral Valve: The mitral valve is normal in structure. Trivial mitral valve regurgitation. No evidence of mitral valve stenosis.  Tricuspid Valve: The tricuspid valve is normal in structure. Tricuspid valve regurgitation is mild . No evidence of tricuspid stenosis.  Aortic Valve: The aortic valve is tricuspid. Aortic valve regurgitation is trivial. No aortic stenosis is present.  Pulmonic Valve: The pulmonic valve was normal in structure. Pulmonic valve regurgitation is trivial. No evidence of pulmonic stenosis.  Aorta: Aortic dilatation noted. There is mild dilatation of the aortic root, measuring 38 mm. There is moderate dilatation of the ascending aorta, measuring 46 mm.  Venous: The inferior vena cava is normal in size with greater than 50% respiratory variability, suggesting right atrial pressure of 3 mmHg.  IAS/Shunts: No atrial level shunt detected by color flow Doppler.   LEFT VENTRICLE PLAX  2D LVIDd:         4.20 cm   Diastology LVIDs:         2.90 cm   LV  e' medial:    8.16 cm/s LV PW:         1.00 cm   LV E/e' medial:  11.8 LV IVS:        1.00 cm   LV e' lateral:   10.20 cm/s LVOT diam:     2.20 cm   LV E/e' lateral: 9.4 LV SV:         94 LV SV Index:   38        2D Longitudinal Strain LVOT Area:     3.80 cm  2D Strain GLS (A2C):   -22.4 % 2D Strain GLS (A3C):   -22.3 % 2D Strain GLS (A4C):   -22.7 % 2D Strain GLS Avg:     -22.5 %  3D Volume EF: 3D EF:        57 % LV EDV:       154 ml LV ESV:       66 ml LV SV:        87 ml  RIGHT VENTRICLE             IVC RV Basal diam:  3.80 cm     IVC diam: 1.70 cm RV S prime:     15.50 cm/s TAPSE (M-mode): 2.9 cm  LEFT ATRIUM             Index        RIGHT ATRIUM           Index LA diam:        5.40 cm 2.17 cm/m   RA Area:     24.10 cm LA Vol (A2C):   45.9 ml 18.43 ml/m  RA Volume:   78.80 ml  31.65 ml/m LA Vol (A4C):   73.1 ml 29.36 ml/m LA Biplane Vol: 58.7 ml 23.57 ml/m AORTIC VALVE LVOT Vmax:   106.50 cm/s LVOT Vmean:  70.000 cm/s LVOT VTI:    0.247 m  AORTA Ao Root diam: 3.80 cm Ao Asc diam:  4.40 cm  MITRAL VALVE               TRICUSPID VALVE MV Area (PHT): 4.64 cm    TR Peak grad:   21.9 mmHg MV Decel Time: 164 msec    TR Vmax:        234.00 cm/s MV E velocity: 95.90 cm/s MV A velocity: 75.50 cm/s  SHUNTS MV E/A ratio:  1.27        Systemic VTI:  0.25 m Systemic Diam: 2.20 cm  Chilton Si MD Electronically signed by Chilton Si MD Signature Date/Time: 07/13/2022/10:22:42 AM    Final    MONITORS  LONG TERM MONITOR (3-14 DAYS) 04/11/2021  Narrative  Patient had a minimum heart rate of 40 bpm, maximum heart rate of 148 bpm, and average heart rate of 67 bpm.  Predominant underlying rhythm was sinus rhythm.  Many short runs of supraventricular tachycardia occurred lasting 20 beats at longest with a max rate of 133 bpm at fastest.  Isolated PACs were frequent (7%).  Isolated PVCs  were rare (<1.0%).  Triggered and diary events associated with sinus rhythm, PACs, and PVCs.   Asymptomatic short runs of paroxysmal SVT (consistent with atrial tachycardia).   CT SCANS  CT CORONARY MORPH W/CTA COR W/SCORE 08/04/2022  Addendum 08/04/2022  8:53 AM ADDENDUM REPORT: 08/04/2022 08:51  EXAM: OVER-READ INTERPRETATION  CT CHEST  The following report is an over-read performed by radiologist Dr. Karle Barr [facility] on 08/04/2022. This  over-read does not include interpretation of cardiac or coronary anatomy or pathology. The coronary calcium score and aortic CTA interpretation by the cardiologist is attached.  COMPARISON:  None.  FINDINGS: Aneurysmal dilatation ascending thoracic aorta 4.1 cm transverse. No evidence of aortic dissection. No pericardial effusion. Calcified lymph nodes RIGHT hilum with calcified granuloma RIGHT upper lobe. Esophagus unremarkable. Visualized liver normal appearance. Remaining lungs clear. No osseous abnormalities.  IMPRESSION: Old granulomatous disease.  Aneurysmal dilatation ascending thoracic aorta 4.1 cm transverse; Recommend annual imaging followup by CTA or MRA. This recommendation follows 2010 ACCF/AHA/AATS/ACR/ASA/SCA/SCAI/SIR/STS/SVM Guidelines for the Diagnosis and Management of Patients with Thoracic Aortic Disease. Circulation. 2010; 121: W119-J478. Aortic aneurysm NOS (ICD10-I71.9)  Aortic aneurysm NOS (ICD10-I71.9).   Electronically Signed By: Ulyses Southward M.D. On: 08/04/2022 08:51  Narrative HISTORY: Chest pain, nonspecific  EXAM: Cardiac/Coronary  CT  TECHNIQUE: The patient was scanned on a [employer].  PROTOCOL: A 120 kV prospective scan was triggered in the descending thoracic aorta at 111 HU's. Axial non-contrast 3 mm slices were carried out through the heart. The data set was analyzed on a dedicated work station and scored using the Agatston method. Gantry rotation  speed was 250 msecs and collimation was .6 mm. Beta blockade and 0.8 mg of sl NTG was given. The 3D data set was reconstructed in 5% intervals of the 35-75 % of the R-R cycle. Systolic and diastolic phases were analyzed on a dedicated work station using MPR, MIP and VRT modes. The patient received contrast: OMNIPAQUE IOHEXOL 350 MG/ML SOLN.  FINDINGS: Image quality: Average  Noise artifact is: Moderately reduced SNR and mildly reduced contrast density due to scan protocol.  Coronary calcium score is 26, which places the patient in the 57th percentile for age and sex matched control.  Coronary arteries: Normal coronary origins.  Right dominance.  Right Coronary Artery: Minimal mixed atherosclerotic plaque in the mid RCA, <25% stenosis. Mild-moderate mixed atherosclerotic plaque in the proximal PDA, 25-49% stenosis. PDA is >2 mm distal to this lesion.  Left Main Coronary Artery: No detectable plaque or stenosis.  Left Anterior Descending Coronary Artery: Minimal mixed atherosclerotic plaque in the proximal LAD, <25% stenosis. Distal LAD not well visualized due to image quality. Grossly normal D1 and D2.  Left Circumflex Artery: Possible mild mixed atherosclerotic plaque in the proximal LCx, 25-49% stenosis. Vessel is small caliber making exact assessment challenging.  Aorta: Mild dilation, 43 mm at the mid ascending aorta (level of the PA bifurcation) measured double oblique.  Sinus of Valsalva 37 x 38 x 37 mm,  normal for age and BSA 2.49.  Aortic Valve: Trivial calcifications.  Valve poorly visualized.  Other findings:  Normal pulmonary vein drainage into the left atrium.  Normal left atrial appendage without thrombus.  Mild dilation of main pulmonary artery, 30 mm  Please see separate report from [facility] for non-cardiac findings.  IMPRESSION: 1. Mild-moderate CAD in proximal PDA, 25-49% stenosis, CADRADS 2. CT FFR will be performed and  reported separately to fully define PDA stenosis. Otherwise, minimal CAD. 2. Coronary calcium score is 26, which places the patient in the 57th percentile for age and sex matched control. 3. Normal coronary origins with right dominance. 4. Mild dilation of mid ascending aorta, 43 mm.  RECOMMENDATIONS: CAD-RADS 2. Mild non-obstructive CAD (25-49%). Consider non-atherosclerotic causes of chest pain. Consider preventive therapy and risk factor modification.  Electronically Signed: By: Weston Brass M.D. On: 08/02/2022 12:25  Lab Results  Component Value Date   WBC 5.0 07/20/2022   HGB 14.9 07/20/2022   HCT 43.6 07/20/2022   MCV 86.2 07/20/2022   PLT 201.0 07/20/2022   Lab Results  Component Value Date   CREATININE 0.95 07/20/2022   BUN 21 07/20/2022   NA 138 07/20/2022   K 4.4 07/20/2022   CL 104 07/20/2022   CO2 29 07/20/2022   Lab Results  Component Value Date   ALT 26 07/20/2022   AST 17 07/20/2022   ALKPHOS 73 07/20/2022   BILITOT 0.7 07/20/2022   Lab Results  Component Value Date   CHOL 112 07/20/2022   HDL 33.60 (L) 07/20/2022   LDLCALC 63 07/20/2022   LDLDIRECT 59.0 08/14/2019   TRIG 78.0 07/20/2022   CHOLHDL 3 07/20/2022    Lab Results  Component Value Date   HGBA1C 5.8 07/20/2022     Assessment & Plan    1.  Nonobstructive atherosclerosis: -Patient underwent CT of the chest and found to have 25-49% PDA proximal lesion with less than 25% mid RCA lesion with no significant stenosis by FFR. -Today patient reports no episodes of chest pain or angina -Continue GDMT with ASA 81 mg, Crestor 10 mg -Due to patient's history of significant CAD we will check LP(a)  2.  Thoracic aortic aneurysm: -Patient underwent surveillance CT of the chest which is completed every 2 years that shows stable aneurysm at 41 mm -Continue GDMT with ASA 81 mg and good blood pressure control -Patient will have surveillance echo in 1 year and repeat CT in 2  years  3.  Essential hypertension: -Patient blood pressure today was well-controlled at 124/78 -Continue Hyzaar 50-12.5 mg daily  4.  Hyperlipidemia: -Patient's LDL cholesterol was 63 at goal -Continue Crestor 10 mg -We will check LP(a) as noted above  5.  Palpitations: -Patient wore 2-week event monitor in 2022 and was started on Cardizem for management of palpitations. -Today patient reports no recurrence of palpitations and are currently under control without calcium channel blocker.  Disposition: Follow-up with Christell Constant, MD or APP in 12 months    Medication Adjustments/Labs and Tests Ordered: Current medicines are reviewed at length with the patient today.  Concerns regarding medicines are outlined above.   Signed, Napoleon Form, Leodis Rains, NP 11/07/2022, 7:48 AM [facility]

## 2022-11-08 ENCOUNTER — Encounter: Payer: Self-pay | Admitting: Nurse Practitioner

## 2022-11-08 ENCOUNTER — Ambulatory Visit: Payer: 59 | Attending: Nurse Practitioner | Admitting: Nurse Practitioner

## 2022-11-08 VITALS — BP 124/78 | HR 54 | Ht 74.0 in | Wt 280.0 lb

## 2022-11-08 DIAGNOSIS — I10 Essential (primary) hypertension: Secondary | ICD-10-CM | POA: Diagnosis not present

## 2022-11-08 DIAGNOSIS — I712 Thoracic aortic aneurysm, without rupture, unspecified: Secondary | ICD-10-CM | POA: Diagnosis not present

## 2022-11-08 DIAGNOSIS — E782 Mixed hyperlipidemia: Secondary | ICD-10-CM

## 2022-11-08 DIAGNOSIS — R002 Palpitations: Secondary | ICD-10-CM

## 2022-11-08 DIAGNOSIS — I2510 Atherosclerotic heart disease of native coronary artery without angina pectoris: Secondary | ICD-10-CM | POA: Diagnosis not present

## 2022-11-08 NOTE — Patient Instructions (Addendum)
Medication Instructions:  Your physician recommends that you continue on your current medications as directed. Please refer to the Current Medication list given to you today. *If you need a refill on your cardiac medications before your next appointment, please call your pharmacy*   Lab Work: TODAY-LIPOPROTEIN A  If you have labs (blood work) drawn today and your tests are completely normal, you will receive your results only by: MyChart Message (if you have MyChart) OR A paper copy in the mail If you have any lab test that is abnormal or we need to change your treatment, we will call you to review the results.   Testing/Procedures: NONE ORDERED   Follow-Up: At [facility], you and your health needs are our priority.  As part of our continuing mission to provide you with exceptional heart care, we have created designated Provider Care Teams.  These Care Teams include your primary Cardiologist (physician) and Advanced Practice Providers (APPs -  Physician Assistants and Nurse Practitioners) who all work together to provide you with the care you need, when you need it.  We recommend signing up for the patient portal called "MyChart".  Sign up information is provided on this After Visit Summary.  MyChart is used to connect with patients for Virtual Visits (Telemedicine).  Patients are able to view lab/test results, encounter notes, upcoming appointments, etc.  Non-urgent messages can be sent to your provider as well.   To learn more about what you can do with MyChart, go to [email or web address].    Your next appointment:   12 month(s)  Provider:   Christell Constant, MD     Other Instructions  HAVE YOUR CHILDREN GET A CALCIUM SCORE

## 2022-11-09 LAB — LIPOPROTEIN A (LPA): Lipoprotein (a): 46.1 nmol/L (ref <75.0)

## 2022-11-21 ENCOUNTER — Encounter: Payer: Self-pay | Admitting: Gastroenterology

## 2022-11-29 ENCOUNTER — Encounter: Payer: Self-pay | Admitting: Gastroenterology

## 2022-12-27 ENCOUNTER — Encounter: Payer: Self-pay | Admitting: Family Medicine

## 2023-02-14 ENCOUNTER — Encounter: Payer: Self-pay | Admitting: Family Medicine

## 2023-03-07 ENCOUNTER — Encounter: Payer: Self-pay | Admitting: Gastroenterology

## 2023-03-07 ENCOUNTER — Ambulatory Visit: Payer: 59 | Admitting: Gastroenterology

## 2023-03-07 VITALS — BP 130/80 | HR 76 | Ht 74.0 in | Wt 283.0 lb

## 2023-03-07 DIAGNOSIS — K625 Hemorrhage of anus and rectum: Secondary | ICD-10-CM

## 2023-03-07 DIAGNOSIS — Z86018 Personal history of other benign neoplasm: Secondary | ICD-10-CM

## 2023-03-07 DIAGNOSIS — K219 Gastro-esophageal reflux disease without esophagitis: Secondary | ICD-10-CM

## 2023-03-07 DIAGNOSIS — R1011 Right upper quadrant pain: Secondary | ICD-10-CM | POA: Diagnosis not present

## 2023-03-07 MED ORDER — NA SULFATE-K SULFATE-MG SULF 17.5-3.13-1.6 GM/177ML PO SOLN: ORAL | 0 refills | Status: DC

## 2023-03-07 NOTE — Progress Notes (Signed)
HPI : Andrew Wilkins is a 57 y.o. male who is referred to [place] by Shelva Majestic, MD for further evaluation of rectal bleeding and chronic reflux symptoms. The patient states that he started seeing blood in his stool about a year ago.  The blood is intermittent and has not progressed in frequency.  He typically will just see blood on the toilet paper, but sometimes he sees a few drops in the toilet water.  Occasionally he will have some perianal itching/discomfort.  He denies any bulges or protrusions.  No pain with passage of stool. His bowel movements are regular, typically once a day. Occasionally he will have issues with a hard stool or difficulty evacuating.  He had his initial screening colonoscopy in December 2016 and had a small ganglioneuroma removed.  No precancerous polyps.  He has no family history of colon cancer.  He has longstanding typical GERD symptoms of heartburn and acid regurgitation.  The symptoms are well-controlled with once daily Aciphex.  For a while, he was not able to get Aciphex and was on Nexium, which did not work well for him.  Since resuming Aciphex, he rarely has any symptoms.  No symptoms of dysphagia or weight loss.  He is a lifelong non-smoker.  No family history of esophageal cancer.  He has never had an upper endoscopy.  He also mentions a mild right upper quadrant pain which comes and goes.  It was also noted in the past year.  It occurs a few times a month on average.  The pain is not severe, rated as 1/10 in severity.  When present, it will be there usually for a few hours before it resolves.  No associated symptoms such as nausea or vomiting.  He is not sure if it is associated with eating or meals.  He states that he has had an ultrasound in the past which did not show gallstones, but did show fatty liver changes.    Colonoscopy December 2016 (Dr. Kinnie Scales) 5 mm sigmoid polyp, otherwise normal (path report showed this to be a ganglioneuroma)  Past  Medical History:  Diagnosis Date   Arthritis    BPH (benign prostatic hyperplasia)    GERD (gastroesophageal reflux disease)    Hyperlipidemia    Hypertension    Kidney stone    Plantar fasciitis    cortisone injection     Past Surgical History:  Procedure Laterality Date   BILATERAL ANTERIOR TOTAL HIP ARTHROPLASTY Bilateral 12/04/2015   Procedure: BILATERAL ANTERIOR APPROACH TOTAL HIP ARTHROPLASTY;  Surgeon: Kathryne Hitch, MD;  Location: [facility];  Service: Orthopedics;  Laterality: Bilateral;   left eye surgery     left eye surgery-squint surgery-age 84   none     TONSILLECTOMY     age 55   Family History  Problem Relation Age of Onset   Lung cancer Father        former smoker   Heart disease Father 38       PAD (carotid endarterectomy at 89) and CAD with MI in late 87s early 68s.    Heart disease Sister 11       mi, smoker   Stroke Brother 33       physician who stopped practicing as result. massive stroke, history of a fib and metabolic syndrome, hyperlipidemia   Stroke Paternal Grandfather 47   Crohn's disease Daughter    Social History   Tobacco Use   Smoking status: Never   Smokeless  tobacco: Never  Substance Use Topics   Alcohol use: Yes    Comment: 3 per month   Drug use: No  EMT Current Outpatient Medications  Medication Sig Dispense Refill   acetaminophen (TYLENOL) 500 MG tablet Take 1,000 mg by mouth daily as needed.      alfuzosin (UROXATRAL) 10 MG 24 hr tablet Take 10 mg by mouth daily with breakfast.     aspirin EC 81 MG tablet Take 81 mg by mouth daily.     azelastine (ASTELIN) 0.1 % nasal spray Use 1 spray in each nostril daily as needed. 30 mL 5   fish oil-omega-3 fatty acids 1000 MG capsule Take 2 g by mouth daily.     folic acid (FOLVITE) 1 MG tablet TAKE 1 TABLET BY MOUTH DAILY 90 tablet 3   HYDROcodone-acetaminophen (NORCO/VICODIN) 5-325 MG tablet Take 1 tablet by mouth every 6 (six) hours as needed for moderate pain or severe pain  (kidney stones). 5 tablet 0   losartan-hydrochlorothiazide (HYZAAR) 50-12.5 MG tablet TAKE 1 TABLET BY MOUTH DAILY 90 tablet 3   Multiple Vitamin (MULTIVITAMIN WITH MINERALS) TABS tablet Take 1 tablet by mouth daily.     RABEprazole (ACIPHEX) 20 MG tablet TAKE 1 TABLET BY MOUTH DAILY 90 tablet 3   rosuvastatin (CRESTOR) 10 MG tablet TAKE 1 TABLET BY MOUTH DAILY 90 tablet 3   traZODone (DESYREL) 50 MG tablet Take 1 tablet (50 mg total) by mouth at bedtime as needed for sleep. 30 tablet 3   No current facility-administered medications for this visit.   No Known Allergies   Review of Systems: All systems reviewed and negative except where noted in HPI.    No results found.  Physical Exam: BP 130/80   Pulse 76   Ht 6\' 2"  (1.88 m)   Wt 283 lb (128.4 kg)   BMI 36.34 kg/m  Constitutional: Pleasant,well-developed, Caucasian male in no acute distress. HEENT: Normocephalic and atraumatic. Conjunctivae are normal. No scleral icterus. Neck supple.  Cardiovascular: Normal rate, regular rhythm.  Pulmonary/chest: Effort normal and breath sounds normal. No wheezing, rales or rhonchi. Abdominal: Soft, nondistended, nontender. Bowel sounds active throughout. There are no masses palpable. No hepatomegaly.  Murphy sign negative Rectal: Deferred until time of colonoscopy Extremities: no edema Neurological: Alert and oriented to person place and time. Skin: Skin is warm and dry. No rashes noted. Psychiatric: Normal mood and affect. Behavior is normal.  CBC    Component Value Date/Time   WBC 5.0 07/20/2022 0920   RBC 5.06 07/20/2022 0920   HGB 14.9 07/20/2022 0920   HCT 43.6 07/20/2022 0920   PLT 201.0 07/20/2022 0920   MCV 86.2 07/20/2022 0920   MCH 29.6 09/30/2019 0146   MCHC 34.2 07/20/2022 0920   RDW 13.4 07/20/2022 0920   LYMPHSABS 1.3 07/20/2022 0920   MONOABS 0.3 07/20/2022 0920   EOSABS 0.1 07/20/2022 0920   BASOSABS 0.0 07/20/2022 0920    CMP     Component Value Date/Time    NA 138 07/20/2022 0920   NA 142 08/06/2021 0730   K 4.4 07/20/2022 0920   CL 104 07/20/2022 0920   CO2 29 07/20/2022 0920   GLUCOSE 95 07/20/2022 0920   GLUCOSE 82 05/08/2006 0949   BUN 21 07/20/2022 0920   BUN 17 08/06/2021 0730   CREATININE 0.95 07/20/2022 0920   CALCIUM 9.7 07/20/2022 0920   PROT 6.7 07/20/2022 0920   PROT 6.3 08/06/2021 0730   ALBUMIN 4.4 07/20/2022 0920   ALBUMIN  4.4 08/06/2021 0730   AST 17 07/20/2022 0920   ALT 26 07/20/2022 0920   ALKPHOS 73 07/20/2022 0920   BILITOT 0.7 07/20/2022 0920   BILITOT 0.5 08/06/2021 0730   GFRNONAA 84 07/30/2020 0746   GFRAA 98 07/30/2020 0746       Latest Ref Rng & Units 07/20/2022    9:20 AM 07/06/2021   10:39 AM 07/02/2020   11:46 AM  CBC EXTENDED  WBC 4.0 - 10.5 K/uL 5.0  4.1  4.8   RBC 4.22 - 5.81 Mil/uL 5.06  5.03  5.23   Hemoglobin 13.0 - 17.0 g/dL 16.1  09.6  04.5   HCT 39.0 - 52.0 % 43.6  43.4  44.8   Platelets 150.0 - 400.0 K/uL 201.0  166.0  216.0   NEUT# 1.4 - 7.7 K/uL 3.3  3.0  3.2   Lymph# 0.7 - 4.0 K/uL 1.3  0.6  1.2       ASSESSMENT AND PLAN:  57 year old male with new onset painless rectal bleeding.  Last colonoscopy was 8 years ago and notable only for a small ganglioneuroma.  His symptoms seem very consistent with bleeding from internal hemorrhoids, but given new onset of symptoms and no recent colonoscopy, another colonoscopy is indicated to definitively exclude mass lesion/polyp. We discussed the anatomy and physiology of hemorrhoids, as well as the principles of hemorrhoid management to include optimization of bowel habits, with a goal of passing a soft formed stool daily without straining, avoidance of hard stools and straining, limiting time on the toilet to less than 5 minutes.  We discussed the role of fiber in optimizing the stool bulk and consistency.  We also discussed the role of hemorrhoid banding for persistent symptoms. He has longstanding GERD symptoms which are well-controlled with  once daily Aciphex.  He has Barrett's risk factors to include age, male sex and obesity.  It is reasonable to perform a one-time very screening EGD at the time of his colonoscopy. Unclear etiology of his recurrent right upper quadrant pain.  Low suspicion for gallbladder source given the very mild nature of the pain and absence of gallstones previously.  We discussed doing a HIDA scan, but I think the pretest probability of a positive scan is extremely low, and I would not recommend he get his gallbladder out based on the very mild nature of his symptoms even if HIDA scan was abnormal.  Will plan to test for H. pylori with gastric biopsies at the time of his upper endoscopy.  Rectal bleeding - Presumed hemorrhoids, but mass lesion/polyps need to be excluded - Colonoscopy - Metamucil to reduce presumed hemorrhoidal bleeding  GERD - EGD for Barrett's screening - Continue Aciphex  RUQ pain - Mild, low likelihood of gallbladder etiology - Will take biopsies to exclude H. Pylori at time of EGD   The details, risks (including bleeding, perforation, infection, missed lesions, medication reactions and possible hospitalization or surgery if complications occur), benefits, and alternatives to colonoscopy with possible biopsy and possible polypectomy were discussed with the patient and he consents to proceed.   I spent a total of 45 minutes reviewing the patient's medical record, interviewing and examining the patient, discussing her diagnosis and management of her condition going forward, and documenting in the medical record   Durene Cal, Aldine Contes, MD

## 2023-03-07 NOTE — Patient Instructions (Addendum)
You have been scheduled for an endoscopy and colonoscopy. Please follow the written instructions given to you at your visit today.  Please pick up your prep supplies at the pharmacy within the next 1-3 days.  If you use inhalers (even only as needed), please bring them with you on the day of your procedure.  DO NOT TAKE 7 DAYS PRIOR TO TEST- Trulicity (dulaglutide) Ozempic, Wegovy (semaglutide) Mounjaro (tirzepatide) Bydureon Bcise (exanatide extended release)  DO NOT TAKE 1 DAY PRIOR TO YOUR TEST Rybelsus (semaglutide) Adlyxin (lixisenatide) Victoza (liraglutide) Byetta (exanatide)  We have sent the following medications to your pharmacy for you to pick up at your convenience: Suprep  Start taking Metamucil daily  If your blood pressure at your visit was 140/90 or greater, please contact your primary care physician to follow up on this.  _______________________________________________________  If you are age 27 or older, your body mass index should be between 23-30. Your Body mass index is 36.34 kg/m. If this is out of the aforementioned range listed, please consider follow up with your Primary Care Provider.  If you are age 33 or younger, your body mass index should be between 19-25. Your Body mass index is 36.34 kg/m. If this is out of the aformentioned range listed, please consider follow up with your Primary Care Provider.   ________________________________________________________  The [facility] providers would like to encourage you to use [facility] to communicate with providers for non-urgent requests or questions.  Due to long hold times on the telephone, sending your provider a message by [facility] may be a faster and more efficient way to get a response.  Please allow 48 business hours for a response.  Please remember that this is for non-urgent requests.  _______________________________________________________  Due to recent changes in healthcare laws, you may see the  results of your imaging and laboratory studies on MyChart before your provider has had a chance to review them.  We understand that in some cases there may be results that are confusing or concerning to you. Not all laboratory results come back in the same time frame and the provider may be waiting for multiple results in order to interpret others.  Please give [place] 48 hours in order for your provider to thoroughly review all the results before contacting the office for clarification of your results.   Thank you for entrusting me with your care and choosing [facility].  Dr Tomasa Rand

## 2023-03-14 ENCOUNTER — Encounter: Payer: Self-pay | Admitting: Gastroenterology

## 2023-03-24 ENCOUNTER — Ambulatory Visit: Payer: 59 | Admitting: Gastroenterology

## 2023-03-24 ENCOUNTER — Encounter: Payer: Self-pay | Admitting: Gastroenterology

## 2023-03-24 VITALS — BP 129/78 | HR 58 | Temp 98.0°F | Resp 17

## 2023-03-24 DIAGNOSIS — K625 Hemorrhage of anus and rectum: Secondary | ICD-10-CM

## 2023-03-24 DIAGNOSIS — K2950 Unspecified chronic gastritis without bleeding: Secondary | ICD-10-CM | POA: Diagnosis not present

## 2023-03-24 DIAGNOSIS — K219 Gastro-esophageal reflux disease without esophagitis: Secondary | ICD-10-CM

## 2023-03-24 DIAGNOSIS — K921 Melena: Secondary | ICD-10-CM | POA: Diagnosis present

## 2023-03-24 DIAGNOSIS — K317 Polyp of stomach and duodenum: Secondary | ICD-10-CM

## 2023-03-24 MED ORDER — SODIUM CHLORIDE 0.9 % IV SOLN: 500.0000 mL | INTRAVENOUS | Status: DC

## 2023-03-24 NOTE — Patient Instructions (Addendum)
Resume previous diet Continue present medications Await pathology results Repeat screening in 2 years or virtual colonoscopy  Handouts/information given for gastric polyps  YOU HAD AN ENDOSCOPIC PROCEDURE TODAY AT THE [facility]:   Refer to the procedure report that was given to you for any specific questions about what was found during the examination.  If the procedure report does not answer your questions, please call your gastroenterologist to clarify.  If you requested that your care partner not be given the details of your procedure findings, then the procedure report has been included in a sealed envelope for you to review at your convenience later.  YOU SHOULD EXPECT: Some feelings of bloating in the abdomen. Passage of more gas than usual.  Walking can help get rid of the air that was put into your GI tract during the procedure and reduce the bloating. If you had a lower endoscopy (such as a colonoscopy or flexible sigmoidoscopy) you may notice spotting of blood in your stool or on the toilet paper. If you underwent a bowel prep for your procedure, you may not have a normal bowel movement for a few days.  Please Note:  You might notice some irritation and congestion in your nose or some drainage.  This is from the oxygen used during your procedure.  There is no need for concern and it should clear up in a day or so.  SYMPTOMS TO REPORT IMMEDIATELY:  Following lower endoscopy (colonoscopy):  Excessive amounts of blood in the stool  Significant tenderness or worsening of abdominal pains  Swelling of the abdomen that is new, acute  Fever of 100F or higher Following upper endoscopy (EGD)  Vomiting of blood or coffee ground material  New chest pain or pain under the shoulder blades  Painful or persistently difficult swallowing  New shortness of breath  Black, tarry-looking stools For urgent or emergent issues, a gastroenterologist can be reached at any hour by calling  [phone]. Do not use MyChart messaging for urgent concerns.   DIET:  We do recommend a small meal at first, but then you may proceed to your regular diet.  Drink plenty of fluids but you should avoid alcoholic beverages for 24 hours.  ACTIVITY:  You should plan to take it easy for the rest of today and you should NOT DRIVE or use heavy machinery until tomorrow (because of the sedation medicines used during the test).    FOLLOW UP: Our staff will call the number listed on your records the next business day following your procedure.  We will call around 7:15- 8:00 am to check on you and address any questions or concerns that you may have regarding the information given to you following your procedure. If we do not reach you, we will leave a message.     If any biopsies were taken you will be contacted by phone or by letter within the next 1-3 weeks.  Please call us at [phone] if you have not heard about the biopsies in 3 weeks.   SIGNATURES/CONFIDENTIALITY: You and/or your care partner have signed paperwork which will be entered into your electronic medical record.  These signatures attest to the fact that that the information above on your After Visit Summary has been reviewed and is understood.  Full responsibility of the confidentiality of this discharge information lies with you and/or your care-partner.

## 2023-03-24 NOTE — Progress Notes (Unsigned)
Sedate, gd SR, tolerated procedure well, VSS, report to RN 

## 2023-03-24 NOTE — Op Note (Signed)
[facility] Patient Name: Andrew Wilkins Procedure Date: 03/24/2023 3:06 PM MRN: [id number] Endoscopist: Lorin Picket E. Tomasa Rand , MD, [id number] Age: 57 Referring MD:  Date of Birth: June 23, 1965 Gender: Male Account #: [id number] Procedure:                Upper GI endoscopy Indications:              Screening for Barrett's esophagus, Right upper                            quadrant pain Medicines:                Monitored Anesthesia Care Procedure:                Pre-Anesthesia Assessment:                           - Prior to the procedure, a History and Physical                            was performed, and patient medications and                            allergies were reviewed. The patient's tolerance of                            previous anesthesia was also reviewed. The risks                            and benefits of the procedure and the sedation                            options and risks were discussed with the patient.                            All questions were answered, and informed consent                            was obtained. Prior Anticoagulants: The patient has                            taken no anticoagulant or antiplatelet agents. ASA                            Grade Assessment: II - A patient with mild systemic                            disease. After reviewing the risks and benefits,                            the patient was deemed in satisfactory condition to                            undergo the procedure.  After obtaining informed consent, the endoscope was                            passed under direct vision. Throughout the                            procedure, the patient's blood pressure, pulse, and                            oxygen saturations were monitored continuously. The                            Olympus Scope SN [id number] was introduced through the                            mouth, and advanced to the second  part of duodenum.                            The upper GI endoscopy was accomplished without                            difficulty. The patient tolerated the procedure                            well. Scope In: Scope Out: Findings:                 The examined esophagus was normal.                           Multiple 2 to 7 mm sessile polyps were found in the                            gastric body. Biopsies were taken with a cold                            forceps for histology. Estimated blood loss was                            minimal.                           The exam of the stomach was otherwise normal.                           Biopsies were taken with a cold forceps in the                            gastric antrum for Helicobacter pylori testing.                            Estimated blood loss was minimal.                           The examined duodenum was normal.  The examined portions of the nasopharynx,                            oropharynx and larynx were normal. Complications:            No immediate complications. Estimated Blood Loss:     Estimated blood loss was minimal. Impression:               - Normal esophagus. No evidence of Barrett's                            esophagus                           - Multiple gastric polyps. Biopsied. These were                            consistent with fundic gland polyps                           - Normal examined duodenum.                           - Biopsies were taken with a cold forceps for                            Helicobacter pylori testing. Recommendation:           - Patient has a contact number available for                            emergencies. The signs and symptoms of potential                            delayed complications were discussed with the                            patient. Return to normal activities tomorrow.                            Written discharge instructions were  provided to the                            patient.                           - Resume previous diet.                           - Continue present medications.                           - Await pathology results. Dory Verdun E. Tomasa Rand, MD 03/24/2023 4:10:51 PM This report has been signed electronically.

## 2023-03-24 NOTE — Progress Notes (Unsigned)
History and Physical Interval Note:  03/24/2023 3:01 PM  Andrew Wilkins  has presented today for endoscopic procedure(s), with the diagnosis of  Encounter Diagnoses  Name Primary?   Rectal bleeding Yes   Gastroesophageal reflux disease, unspecified whether esophagitis present   .  The various methods of evaluation and treatment have been discussed with the patient and/or family. After consideration of risks, benefits and other options for treatment, the patient has consented to  the endoscopic procedure(s).   The patient's history has been reviewed, patient examined, no change in status, stable for endoscopic procedure(s).  I have reviewed the patient's chart and labs.  Questions were answered to the patient's satisfaction.     Exodus Kutzer E. Tomasa Rand, MD [facility]

## 2023-03-24 NOTE — Op Note (Signed)
[facility] Patient Name: Andrew Wilkins Procedure Date: 03/24/2023 3:03 PM MRN: [id number] Endoscopist: Lorin Picket E. Tomasa Rand , MD, [id number] Age: 57 Referring MD:  Date of Birth: [birth year] Gender: Male Account #: [id number] Procedure:                Colonoscopy Indications:              Hematochezia Medicines:                Monitored Anesthesia Care Procedure:                Pre-Anesthesia Assessment:                           - Prior to the procedure, a History and Physical                            was performed, and patient medications and                            allergies were reviewed. The patient's tolerance of                            previous anesthesia was also reviewed. The risks                            and benefits of the procedure and the sedation                            options and risks were discussed with the patient.                            All questions were answered, and informed consent                            was obtained. Prior Anticoagulants: The patient has                            taken no anticoagulant or antiplatelet agents. ASA                            Grade Assessment: II - A patient with mild systemic                            disease. After reviewing the risks and benefits,                            the patient was deemed in satisfactory condition to                            undergo the procedure.                           After obtaining informed consent, the colonoscope  was passed under direct vision. Throughout the                            procedure, the patient's blood pressure, pulse, and                            oxygen saturations were monitored continuously. The                            Olympus Scope [id number] was introduced through the                            anus and advanced to the the ascending colon. The                            colonoscopy was unusually difficult  due to a                            redundant colon, significant looping and a tortuous                            colon. In addition, the air insufflation was not                            functional on withdrawal. Successful completion of                            the procedure was not possible despite changing the                            patient to a prone position, using manual pressure,                            water insufflation and withdrawing and reinserting                            the scope. The patient tolerated the procedure                            well. The quality of the bowel preparation was                            adequate. No anatomical landmarks were photographed. Scope In: 3:21:56 PM Scope Out: 4:05:12 PM Total Procedure Duration: 0 hours 43 minutes 16 seconds  Findings:                 The perianal and digital rectal examinations were                            normal. Pertinent negatives include normal                            sphincter tone and no palpable rectal lesions.  The colon (entire examined portion) appeared normal. Complications:            No immediate complications. Estimated Blood Loss:     Estimated blood loss: none. Impression:               - The entire examined colon is normal.                           - No specimens collected.                           - Incomplete colonoscopy due to redundant colon                            with excessive looping and tortuosity.                           - The procedure was successful in excluding causes                            of bright red blood per rectum (large polyp, mass,                            proctitis), but cannot be considered a complete                            exam for colon cancer screening given absence of                            cecal visualization Recommendation:           - Patient has a contact number available for                             emergencies. The signs and symptoms of potential                            delayed complications were discussed with the                            patient. Return to normal activities tomorrow.                            Written discharge instructions were provided to the                            patient.                           - Resume previous diet.                           - Continue present medications.                           - As patient was not due for colon cancer screening  until 2026, would recommend either repeat attempt                            at colonoscopy or virtual colonoscopy in 2026. [name] E. Tomasa Rand, MD 03/24/2023 4:22:14 PM This report has been signed electronically.

## 2023-03-24 NOTE — Progress Notes (Signed)
Called to room to assist during endoscopic procedure.  Patient ID and intended procedure confirmed with present staff. Received instructions for my participation in the procedure from the performing physician.  

## 2023-03-27 ENCOUNTER — Telehealth: Payer: Self-pay | Admitting: *Deleted

## 2023-03-27 NOTE — Telephone Encounter (Signed)
  Follow up Call-     03/24/2023    2:41 PM  Call back number  Post procedure Call Back phone  # [phone]  Permission to leave phone message Yes     Patient questions:  Do you have a fever, pain , or abdominal swelling? No. Pain Score  0 *  Have you tolerated food without any problems? Yes.    Have you been able to return to your normal activities? Yes.    Do you have any questions about your discharge instructions: Diet   No. Medications  No. Follow up visit  No.  Do you have questions or concerns about your Care? No.  Actions: * If pain score is 4 or above: No action needed, pain <4.

## 2023-03-29 LAB — SURGICAL PATHOLOGY

## 2023-03-30 NOTE — Progress Notes (Signed)
Andrew Wilkins,  The biopsies taken from your stomach were notable for mild chronic gastritis (inflammation) which is a common finding, but there was no evidence of Helicobacter pylori infection. This common finding is not likely to explain abdominal pain and there is no specific treatment or further evaluation recommended. The polyps resected from your stomach were benign fundic gland polyps.  There was no evidence of infection with Helicobacter pylori or intestinal metaplasia/dysplasia.  These types of polyps are typically secondary to acid suppression therapy and no specific follow-up required for these small, benign polyps.  As discussed, I would recommend a repeat attempt at a colonoscopy or a virtual colonoscopy in 2026 for colon cancer screening.

## 2023-06-11 ENCOUNTER — Other Ambulatory Visit: Payer: Self-pay | Admitting: Family Medicine

## 2023-06-11 ENCOUNTER — Other Ambulatory Visit: Payer: Self-pay | Admitting: Urology

## 2023-06-24 ENCOUNTER — Other Ambulatory Visit: Payer: Self-pay | Admitting: Family Medicine

## 2023-06-26 ENCOUNTER — Other Ambulatory Visit: Payer: Self-pay

## 2023-06-26 MED ORDER — ALFUZOSIN HCL ER 10 MG PO TB24
10.0000 mg | ORAL_TABLET | Freq: Every day | ORAL | 2 refills | Status: DC
  Filled 2023-06-26: qty 30, 30d supply, fill #0

## 2023-06-26 MED ORDER — ALFUZOSIN HCL ER 10 MG PO TB24
10.0000 mg | ORAL_TABLET | Freq: Every day | ORAL | 2 refills | Status: DC
  Filled 2023-06-26: qty 30, 30d supply, fill #0
  Filled 2023-07-27: qty 30, 30d supply, fill #1

## 2023-07-18 ENCOUNTER — Other Ambulatory Visit: Payer: Self-pay | Admitting: Urology

## 2023-09-24 ENCOUNTER — Other Ambulatory Visit: Payer: Self-pay | Admitting: Family Medicine

## 2023-10-10 ENCOUNTER — Encounter: Payer: Self-pay | Admitting: Family Medicine

## 2023-10-10 ENCOUNTER — Ambulatory Visit: Admitting: Family Medicine

## 2023-10-10 VITALS — BP 127/80 | HR 68 | Temp 98.4°F | Ht 74.0 in | Wt 280.0 lb

## 2023-10-10 DIAGNOSIS — Z131 Encounter for screening for diabetes mellitus: Secondary | ICD-10-CM | POA: Diagnosis not present

## 2023-10-10 DIAGNOSIS — Z23 Encounter for immunization: Secondary | ICD-10-CM | POA: Diagnosis not present

## 2023-10-10 DIAGNOSIS — E782 Mixed hyperlipidemia: Secondary | ICD-10-CM

## 2023-10-10 DIAGNOSIS — I10 Essential (primary) hypertension: Secondary | ICD-10-CM

## 2023-10-10 DIAGNOSIS — E669 Obesity, unspecified: Secondary | ICD-10-CM

## 2023-10-10 MED ORDER — ZEPBOUND 2.5 MG/0.5ML ~~LOC~~ SOAJ: 2.5000 mg | SUBCUTANEOUS | 3 refills | Status: DC

## 2023-10-10 NOTE — Progress Notes (Signed)
 Phone [phone] In person visit   Subjective:   Andrew Wilkins is a 58 y.o. year old very pleasant male patient who presents for/with See problem oriented charting Chief Complaint  Patient presents with   Obesity    Need to discuss several emerging health issues including weight gain and intermittent myalgia   Past Medical History-  Patient Active Problem List   Diagnosis Date Noted   Thoracic aortic aneurysm without rupture, unspecified part (HCC)     Priority: High   Hepatic steatosis 11/03/2020    Priority: Medium    History of melanoma in situ 08/02/2016    Priority: Medium    Hyperglycemia 10/05/2015    Priority: Medium    BPH (benign prostatic hyperplasia) 04/02/2014    Priority: Medium    Family history of peripheral vascular disease 03/17/2011    Priority: Medium    Lipoma of back 07/08/2009    Priority: Medium    Hyperlipidemia 02/26/2007    Priority: Medium    Essential hypertension 02/26/2007    Priority: Medium    Osteoarthritis 02/26/2007    Priority: Medium    Status post bilateral hip replacements 12/04/2015    Priority: Low   Hemangioma of liver 04/02/2014    Priority: Low   Nephrolithiasis 04/02/2014    Priority: Low   GERD 06/18/2007    Priority: Low   INSOMNIA, PERSISTENT 02/26/2007    Priority: Low   Family history of aortic aneurysm 07/14/2020    Priority: 1.   Palpitations 03/05/2021   Pulmonary nodules 11/03/2020    Medications- reviewed and updated Current Outpatient Medications  Medication Sig Dispense Refill   acetaminophen  (TYLENOL ) 500 MG tablet Take 1,000 mg by mouth daily as needed.      alfuzosin  (UROXATRAL ) 10 MG 24 hr tablet Take 1 tablet (10 mg total) by mouth daily with breakfast. 30 tablet 2   aspirin  EC 81 MG tablet Take 81 mg by mouth daily.     azelastine  (ASTELIN ) 0.1 % nasal spray Use 1 spray in each nostril daily as needed. 30 mL 5   diclofenac  (VOLTAREN ) 75 MG EC tablet Take 75 mg by mouth 2 (two) times daily  as needed.     fish oil-omega-3 fatty acids 1000 MG capsule Take 2 g by mouth daily.     folic acid  (FOLVITE ) 1 MG tablet TAKE 1 TABLET BY MOUTH DAILY 90 tablet 3   HYDROcodone -acetaminophen  (NORCO/VICODIN) 5-325 MG tablet Take 1 tablet by mouth every 6 (six) hours as needed for moderate pain or severe pain (kidney stones). 5 tablet 0   losartan -hydrochlorothiazide  (HYZAAR) 50-12.5 MG tablet TAKE 1 TABLET BY MOUTH DAILY 90 tablet 3   Multiple Vitamin (MULTIVITAMIN WITH MINERALS) TABS tablet Take 1 tablet by mouth daily.     RABEprazole  (ACIPHEX ) 20 MG tablet TAKE 1 TABLET BY MOUTH DAILY 90 tablet 3   rosuvastatin  (CRESTOR ) 10 MG tablet TAKE 1 TABLET BY MOUTH DAILY 90 tablet 3   tirzepatide (ZEPBOUND) 2.5 MG/0.5ML Pen Inject 2.5 mg into the skin once a week. 6 mL 3   No current facility-administered medications for this visit.     Objective:  BP 127/80   Pulse 68   Temp 98.4 F (36.9 C)   Ht 6\' 2"  (1.88 m)   Wt 280 lb (127 kg)   SpO2 98%   BMI 35.95 kg/m  Gen: NAD, resting comfortably CV: RRR no murmurs rubs or gallops Lungs: CTAB no crackles, wheeze, rhonchi  Ext: trace edema Skin:  warm, dry    Assessment and Plan   # Social update-new grandfather-just visited [place] to see his grandbaby!  # Obesity-technically morbid with BMI over 35 and hypertension and hyperlipidemia S:has worked hard on improving diet and has gotten more regular on exercise- gets set backs with orthopedic issues- working with Dr. Hulda Mage  -up 31 lbs from 2008- gradual increase -feels eats too many carbohydrates - feels could make better choices and reduce portion sizes- doing mainly grilled chicken and fish and trying to get vegetables in.  Early morning is still really hard for him- ends up grabbing a biscuit sometimes.  Wt Readings from Last 3 Encounters:  10/10/23 280 lb (127 kg)  03/07/23 283 lb (128.4 kg)  11/08/22 280 lb (127 kg)  A/P: Patient typically with morbid obesity with BMI over 35 and  hypertension and hyperlipidemia.  Family history of cardiovascular disease and he has nonobstructive coronary artery disease-I think he be an excellent candidate for GLP-1 type medications-I think he would have better weight loss with Zepbound and we opted to trial this-they also have better cash pay options through [employer]-he will try through mail-order and submit online savings card if needed and if not we can try cash pay or prior authorization - Also would be good for his prediabetes to get A1c down further-update this today Lab Results  Component Value Date   HGBA1C 5.8 07/20/2022   HGBA1C 5.9 01/10/2022   HGBA1C 5.8 07/06/2021    # Nonobstructive coronary disease diagnosed over 2024-25-49% PDA proximal lesion with less than 25% mid RCA lesion with no significant stenosis by FFR. #Hyperlipidemia S:Compliant with Crestor  10 mg.  Strong family history of cardiovascular disease.  We have opted to continue aspirin  as result. -Lipoprotein a not elevated  - 2-3 weeks of myalgia and joint pain that was unusual for him when he scheduled this but has resolved Lab Results  Component Value Date   CHOL 112 07/20/2022   HDL 33.60 (L) 07/20/2022   LDLCALC 63 07/20/2022   LDLDIRECT 59.0 08/14/2019   TRIG 78.0 07/20/2022   CHOLHDL 3 07/20/2022   A/P: Lipids have been well-controlled-continue current medication.  At goal for nonobstructive coronary disease.  Appreciate excellent workup by cardiology including negative lipoprotein a.  Also takes aspirin  81 mg   #Essential hypertension S: Compliant with alfuzosin , losartan  50 mg-hydrochlorothiazide  12.5 mg A/P: Blood pressure well-controlled-may need to reduce dose as we work on weight loss  % BPH-follows with urology for elevated PSA-he reports most recent numbers have come back down.  He is compliant with alfuzosin   #GERD S: Compliant with AcipHex .  He is on oral B12. Has not done well with h2 blocker trials in past A/P: Discussed possible  flare with Zepbound-he will monitor and continue Aciphex   #Fatty liver noted on prior imaging-we will check LFTs today  # Heel spur-patient reports may need rather significant surgery for this-working with Dr. Alyssa Backbone diclofenac  with coronary artery disease and GI bleeding risk recommended to be very cautious with this-at least he is on Aciphex  to counter some of the GI effects  Recommended follow up: Return in about 6 months (around 04/11/2024) for physical or sooner if needed.Schedule b4 you leave. Future Appointments  Date Time Provider Department Center  04/11/2024  9:20 AM Almira Jaeger, MD [facility]    Lab/Order associations: omelet only    ICD-10-CM   1. Essential hypertension  I10 CBC with Differential/Platelet    Comprehensive metabolic panel with GFR  Lipid panel    2. Mixed hyperlipidemia  E78.2 CBC with Differential/Platelet    Comprehensive metabolic panel with GFR    Lipid panel    3. Screening for diabetes mellitus  Z13.1 Hemoglobin A1c    4. Obesity (BMI 30-39.9)  E66.9 Hemoglobin A1c    5. Need for shingles vaccine  Z23 Zoster Recombinant (Shingrix )    6. Need for pneumococcal 20-valent conjugate vaccination  Z23 Pneumococcal conjugate vaccine 20-valent (Prevnar 20)      Meds ordered this encounter  Medications   tirzepatide (ZEPBOUND) 2.5 MG/0.5ML Pen    Sig: Inject 2.5 mg into the skin once a week.    Dispense:  6 mL    Refill:  3    Return precautions advised.  Clarisa Crooked, MD

## 2023-10-10 NOTE — Patient Instructions (Addendum)
 Let me know if weight loss stagnates at least 3 weeks at 2.5 and can go up to 5 mg dose  Please stop by lab before you go If you have mychart- we will send your results within 3 business days of us  receiving them.  If you do not have mychart- we will call you about results within 5 business days of us  receiving them.  *please also note that you will see labs on mychart as soon as they post. I will later go in and write notes on them- will say "notes from Dr. Arlene Ben"   Recommended follow up: Return in about 6 months (around 04/11/2024) for physical or sooner if needed.Schedule b4 you leave.

## 2023-10-11 ENCOUNTER — Encounter: Payer: Self-pay | Admitting: Family Medicine

## 2023-10-11 LAB — CBC WITH DIFFERENTIAL/PLATELET
Basophils Absolute: 0.1 10*3/uL (ref 0.0–0.1)
Basophils Relative: 1 % (ref 0.0–3.0)
Eosinophils Absolute: 0.2 10*3/uL (ref 0.0–0.7)
Eosinophils Relative: 3.3 % (ref 0.0–5.0)
HCT: 42 % (ref 39.0–52.0)
Hemoglobin: 14.2 g/dL (ref 13.0–17.0)
Lymphocytes Relative: 21.7 % (ref 12.0–46.0)
Lymphs Abs: 1.2 10*3/uL (ref 0.7–4.0)
MCHC: 33.7 g/dL (ref 30.0–36.0)
MCV: 87.2 fl (ref 78.0–100.0)
Monocytes Absolute: 0.4 10*3/uL (ref 0.1–1.0)
Monocytes Relative: 7.1 % (ref 3.0–12.0)
Neutro Abs: 3.8 10*3/uL (ref 1.4–7.7)
Neutrophils Relative %: 66.9 % (ref 43.0–77.0)
Platelets: 213 10*3/uL (ref 150.0–400.0)
RBC: 4.82 Mil/uL (ref 4.22–5.81)
RDW: 13.3 % (ref 11.5–15.5)
WBC: 5.6 10*3/uL (ref 4.0–10.5)

## 2023-10-11 LAB — LIPID PANEL
Cholesterol: 96 mg/dL (ref 0–200)
HDL: 29.6 mg/dL — ABNORMAL LOW (ref >39.00)
LDL Cholesterol: 47 mg/dL (ref 0–99)
NonHDL: 66.44
Total CHOL/HDL Ratio: 3
Triglycerides: 98 mg/dL (ref 0.0–149.0)
VLDL: 19.6 mg/dL (ref 0.0–40.0)

## 2023-10-11 LAB — COMPREHENSIVE METABOLIC PANEL WITH GFR
ALT: 26 U/L (ref 0–53)
AST: 22 U/L (ref 0–37)
Albumin: 4.5 g/dL (ref 3.5–5.2)
Alkaline Phosphatase: 69 U/L (ref 39–117)
BUN: 19 mg/dL (ref 6–23)
CO2: 30 meq/L (ref 19–32)
Calcium: 9.3 mg/dL (ref 8.4–10.5)
Chloride: 103 meq/L (ref 96–112)
Creatinine, Ser: 0.93 mg/dL (ref 0.40–1.50)
GFR: 90.39 mL/min (ref >60.00)
Glucose, Bld: 90 mg/dL (ref 70–99)
Potassium: 3.8 meq/L (ref 3.5–5.1)
Sodium: 139 meq/L (ref 135–145)
Total Bilirubin: 0.7 mg/dL (ref 0.2–1.2)
Total Protein: 6.6 g/dL (ref 6.0–8.3)

## 2023-10-11 LAB — HEMOGLOBIN A1C: Hgb A1c MFr Bld: 5.8 % (ref 4.6–6.5)

## 2023-11-13 ENCOUNTER — Encounter: Payer: Self-pay | Admitting: Family Medicine

## 2023-11-13 NOTE — Telephone Encounter (Signed)
 Please submit PA for Zepbound.

## 2023-11-14 ENCOUNTER — Telehealth: Payer: Self-pay

## 2023-11-14 ENCOUNTER — Other Ambulatory Visit (HOSPITAL_COMMUNITY): Payer: Self-pay

## 2023-11-14 NOTE — Telephone Encounter (Signed)
 Pharmacy Patient Advocate Encounter   Received notification from Physician's Office that prior authorization for Zepbound  2.5MG /0.5ML is required/requested.   Insurance verification completed.   The patient is insured through [facility] .   Per test claim: Product not covered/ Plan Exclusion.

## 2023-11-15 NOTE — Telephone Encounter (Signed)
 Pt made aware via my chart

## 2023-12-19 ENCOUNTER — Ambulatory Visit: Attending: Internal Medicine | Admitting: Internal Medicine

## 2023-12-19 VITALS — BP 128/78 | HR 62 | Ht 74.0 in | Wt 283.0 lb

## 2023-12-19 DIAGNOSIS — I2510 Atherosclerotic heart disease of native coronary artery without angina pectoris: Secondary | ICD-10-CM | POA: Diagnosis not present

## 2023-12-19 DIAGNOSIS — E782 Mixed hyperlipidemia: Secondary | ICD-10-CM

## 2023-12-19 DIAGNOSIS — I712 Thoracic aortic aneurysm, without rupture, unspecified: Secondary | ICD-10-CM

## 2023-12-19 DIAGNOSIS — I10 Essential (primary) hypertension: Secondary | ICD-10-CM | POA: Diagnosis not present

## 2023-12-19 DIAGNOSIS — R072 Precordial pain: Secondary | ICD-10-CM

## 2023-12-19 DIAGNOSIS — R002 Palpitations: Secondary | ICD-10-CM | POA: Diagnosis not present

## 2023-12-19 DIAGNOSIS — R931 Abnormal findings on diagnostic imaging of heart and coronary circulation: Secondary | ICD-10-CM

## 2023-12-19 NOTE — Progress Notes (Signed)
 Cardiology Office Note:  .    Date:  12/19/2023  ID:  Andrew Wilkins, DOB 1965/12/06, MRN [id number] PCP: Katrinka Garnette KIDD, MD  [facility] Providers Cardiologist:  Stanly DELENA Leavens, MD     CC: CAD follow up  History of Present Illness: .    Andrew Wilkins is a 58 y.o. male with a history of morbid obesity, hypertension, hyperlipidemia, mild aortic dilation, and paroxysmal supraventricular tachycardia who presents for routine cardiovascular follow-up.  He has a history of morbid obesity, which has contributed to his hypertension and hyperlipidemia. He is hopeful that weight loss will alleviate some of his orthopedic issues.  He is on Zepbound  and rosuvastatin  for hyperlipidemia.  He has a history of mild aortic dilation, monitored with imaging studies. The last CT aorta in 01-22-23 showed moderate thoracic aortic dilation.  He experiences exercise-induced palpitations, which may be related to his weight. He has a history of asymptomatic paroxysmal supraventricular tachycardia, which is monitored annually.  Discussed the use of AI scribe software for clinical note transcription with the patient, who gave verbal consent to proceed.   Relevant histories: .  Social  January 21, 2021:  Small Pulm nodules, Aorta 41 mm.  Lost Father in law and brother; had palpitations around that time with asymptomatic P-SVT on monitor 01/21/22: Stable Aorta nodules resolved.58 yo M passed away 01-22-2023: Non obstructive CAD - Paramedic for 35+ years; runs ambulance service for the county; Father in law died and brother (was ICU Doc) had a stroke and passed a way as well 01-21-21).    ROS: As per HPI.   Studies Reviewed: .     Cardiac Studies & Procedures   ______________________________________________________________________________________________     ECHOCARDIOGRAM  ECHOCARDIOGRAM COMPLETE 07/13/2022  Narrative ECHOCARDIOGRAM REPORT    Patient Name:   Andrew Wilkins Date of Exam:  07/13/2022 Medical Rec #:  [id number]        Height:       74.0 in Accession #:    [id number]       Weight:       275.4 lb Date of Birth:  Apr 24, 1966        BSA:          2.490 m Patient Age:    56 years         BP:           110/60 mmHg Patient Gender: M                HR:           56 bpm. Exam Location:  [address]  Procedure: 2D Echo, 3D Echo, Cardiac Doppler, Color Doppler and Strain Analysis  Indications:    I71.20 Thoracic Aortic Aneurysm  History:        Patient has prior history of Echocardiogram examinations, most recent 08/10/2020. Risk Factors:Hypertension and Dyslipidemia.  Sonographer:    Carl Coma RDCS Referring Phys: [id number] [facility]  IMPRESSIONS   1. Left ventricular ejection fraction, by estimation, is 55 to 60%. The left ventricle has normal function. The left ventricle has no regional wall motion abnormalities. Left ventricular diastolic parameters were normal. The average left ventricular global longitudinal strain is -22.5 %. The global longitudinal strain is normal. 2. Right ventricular systolic function is normal. The right ventricular size is normal. There is normal pulmonary artery systolic pressure. 3. Left atrial size was mildly dilated. 4. Right atrial size was mildly dilated. 5. The mitral valve is normal in  structure. Trivial mitral valve regurgitation. No evidence of mitral stenosis. 6. The aortic valve is tricuspid. Aortic valve regurgitation is trivial. No aortic stenosis is present. 7. Aortic dilatation noted. There is mild dilatation of the aortic root, measuring 38 mm. There is moderate dilatation of the ascending aorta, measuring 46 mm. 8. The inferior vena cava is normal in size with greater than 50% respiratory variability, suggesting right atrial pressure of 3 mmHg.  FINDINGS Left Ventricle: Left ventricular ejection fraction, by estimation, is 55 to 60%. The left ventricle has normal function. The left ventricle has no  regional wall motion abnormalities. The average left ventricular global longitudinal strain is -22.5 %. The global longitudinal strain is normal. The left ventricular internal cavity size was normal in size. There is no left ventricular hypertrophy. Left ventricular diastolic parameters were normal.  Right Ventricle: The right ventricular size is normal. No increase in right ventricular wall thickness. Right ventricular systolic function is normal. There is normal pulmonary artery systolic pressure. The tricuspid regurgitant velocity is 2.34 m/s, and with an assumed right atrial pressure of 3 mmHg, the estimated right ventricular systolic pressure is 24.9 mmHg.  Left Atrium: Left atrial size was mildly dilated.  Right Atrium: Right atrial size was mildly dilated.  Pericardium: There is no evidence of pericardial effusion.  Mitral Valve: The mitral valve is normal in structure. Trivial mitral valve regurgitation. No evidence of mitral valve stenosis.  Tricuspid Valve: The tricuspid valve is normal in structure. Tricuspid valve regurgitation is mild . No evidence of tricuspid stenosis.  Aortic Valve: The aortic valve is tricuspid. Aortic valve regurgitation is trivial. No aortic stenosis is present.  Pulmonic Valve: The pulmonic valve was normal in structure. Pulmonic valve regurgitation is trivial. No evidence of pulmonic stenosis.  Aorta: Aortic dilatation noted. There is mild dilatation of the aortic root, measuring 38 mm. There is moderate dilatation of the ascending aorta, measuring 46 mm.  Venous: The inferior vena cava is normal in size with greater than 50% respiratory variability, suggesting right atrial pressure of 3 mmHg.  IAS/Shunts: No atrial level shunt detected by color flow Doppler.   LEFT VENTRICLE PLAX 2D LVIDd:         4.20 cm   Diastology LVIDs:         2.90 cm   LV e' medial:    8.16 cm/s LV PW:         1.00 cm   LV E/e' medial:  11.8 LV IVS:        1.00 cm   LV e'  lateral:   10.20 cm/s LVOT diam:     2.20 cm   LV E/e' lateral: 9.4 LV SV:         94 LV SV Index:   38        2D Longitudinal Strain LVOT Area:     3.80 cm  2D Strain GLS (A2C):   -22.4 % 2D Strain GLS (A3C):   -22.3 % 2D Strain GLS (A4C):   -22.7 % 2D Strain GLS Avg:     -22.5 %  3D Volume EF: 3D EF:        57 % LV EDV:       154 ml LV ESV:       66 ml LV SV:        87 ml  RIGHT VENTRICLE             IVC RV Basal diam:  3.80 cm  IVC diam: 1.70 cm RV S prime:     15.50 cm/s TAPSE (M-mode): 2.9 cm  LEFT ATRIUM             Index        RIGHT ATRIUM           Index LA diam:        5.40 cm 2.17 cm/m   RA Area:     24.10 cm LA Vol (A2C):   45.9 ml 18.43 ml/m  RA Volume:   78.80 ml  31.65 ml/m LA Vol (A4C):   73.1 ml 29.36 ml/m LA Biplane Vol: 58.7 ml 23.57 ml/m AORTIC VALVE LVOT Vmax:   106.50 cm/s LVOT Vmean:  70.000 cm/s LVOT VTI:    0.247 m  AORTA Ao Root diam: 3.80 cm Ao Asc diam:  4.40 cm  MITRAL VALVE               TRICUSPID VALVE MV Area (PHT): 4.64 cm    TR Peak grad:   21.9 mmHg MV Decel Time: 164 msec    TR Vmax:        234.00 cm/s MV E velocity: 95.90 cm/s MV A velocity: 75.50 cm/s  SHUNTS MV E/A ratio:  1.27        Systemic VTI:  0.25 m Systemic Diam: 2.20 cm  Annabella Scarce MD Electronically signed by Annabella Scarce MD Signature Date/Time: 07/13/2022/10:22:42 AM    Final    MONITORS  LONG TERM MONITOR (3-14 DAYS) 04/09/2021  Narrative  Patient had a minimum heart rate of 40 bpm, maximum heart rate of 148 bpm, and average heart rate of 67 bpm.  Predominant underlying rhythm was sinus rhythm.  Many short runs of supraventricular tachycardia occurred lasting 20 beats at longest with a max rate of 133 bpm at fastest.  Isolated PACs were frequent (7%).  Isolated PVCs were rare (<1.0%).  Triggered and diary events associated with sinus rhythm, PACs, and PVCs.   Asymptomatic short runs of paroxysmal SVT (consistent with atrial  tachycardia).   CT SCANS  CT CORONARY MORPH W/CTA COR W/SCORE 08/02/2022  Addendum 08/04/2022  8:53 AM ADDENDUM REPORT: 08/04/2022 08:51  EXAM: OVER-READ INTERPRETATION  CT CHEST  The following report is an over-read performed by radiologist Dr. Oneil [facility] on 08/04/2022. This over-read does not include interpretation of cardiac or coronary anatomy or pathology. The coronary calcium  score and aortic CTA interpretation by the cardiologist is attached.  COMPARISON:  None.  FINDINGS: Aneurysmal dilatation ascending thoracic aorta 4.1 cm transverse. No evidence of aortic dissection. No pericardial effusion. Calcified lymph nodes RIGHT hilum with calcified granuloma RIGHT upper lobe. Esophagus unremarkable. Visualized liver normal appearance. Remaining lungs clear. No osseous abnormalities.  IMPRESSION: Old granulomatous disease.  Aneurysmal dilatation ascending thoracic aorta 4.1 cm transverse; Recommend annual imaging followup by CTA or MRA. This recommendation follows 2010 ACCF/AHA/AATS/ACR/ASA/SCA/SCAI/SIR/STS/SVM Guidelines for the Diagnosis and Management of Patients with Thoracic Aortic Disease. Circulation. 2010; 121: Z733-z630. Aortic aneurysm NOS (ICD10-I71.9)  Aortic aneurysm NOS (ICD10-I71.9).   Electronically Signed By: Oneil Kiss M.D. On: 08/04/2022 08:51  Narrative HISTORY: Chest pain, nonspecific  EXAM: Cardiac/Coronary  CT  TECHNIQUE: The patient was scanned on a [employer].  PROTOCOL: A 120 kV prospective scan was triggered in the descending thoracic aorta at 111 HU's. Axial non-contrast 3 mm slices were carried out through the heart. The data set was analyzed on a dedicated work station and scored using the Agatston method. Gantry rotation  speed was 250 msecs and collimation was .6 mm. Beta blockade and 0.8 mg of sl NTG was given. The 3D data set was reconstructed in 5% intervals of the 35-75 % of the R-R  cycle. Systolic and diastolic phases were analyzed on a dedicated work station using MPR, MIP and VRT modes. The patient received contrast: 100mL OMNIPAQUE  IOHEXOL  350 MG/ML SOLN.  FINDINGS: Image quality: Average  Noise artifact is: Moderately reduced SNR and mildly reduced contrast density due to scan protocol.  Coronary calcium  score is 26, which places the patient in the 57th percentile for age and sex matched control.  Coronary arteries: Normal coronary origins.  Right dominance.  Right Coronary Artery: Minimal mixed atherosclerotic plaque in the mid RCA, <25% stenosis. Mild-moderate mixed atherosclerotic plaque in the proximal PDA, 25-49% stenosis. PDA is >2 mm distal to this lesion.  Left Main Coronary Artery: No detectable plaque or stenosis.  Left Anterior Descending Coronary Artery: Minimal mixed atherosclerotic plaque in the proximal LAD, <25% stenosis. Distal LAD not well visualized due to image quality. Grossly normal D1 and D2.  Left Circumflex Artery: Possible mild mixed atherosclerotic plaque in the proximal LCx, 25-49% stenosis. Vessel is small caliber making exact assessment challenging.  Aorta: Mild dilation, 43 mm at the mid ascending aorta (level of the PA bifurcation) measured double oblique.  Sinus of Valsalva 37 x 38 x 37 mm,  normal for age and BSA 2.49.  Aortic Valve: Trivial calcifications.  Valve poorly visualized.  Other findings:  Normal pulmonary vein drainage into the left atrium.  Normal left atrial appendage without thrombus.  Mild dilation of main pulmonary artery, 30 mm  Please see separate report from [facility] for non-cardiac findings.  IMPRESSION: 1. Mild-moderate CAD in proximal PDA, 25-49% stenosis, CADRADS 2. CT FFR will be performed and reported separately to fully define PDA stenosis. Otherwise, minimal CAD. 2. Coronary calcium  score is 26, which places the patient in the 57th percentile for age and sex  matched control. 3. Normal coronary origins with right dominance. 4. Mild dilation of mid ascending aorta, 43 mm.  RECOMMENDATIONS: CAD-RADS 2. Mild non-obstructive CAD (25-49%). Consider non-atherosclerotic causes of chest pain. Consider preventive therapy and risk factor modification.  Electronically Signed: By: Soyla Merck M.D. On: 08/02/2022 12:25     ______________________________________________________________________________________________      Physical Exam:    VS:  BP 128/78 (BP Location: Right Arm)   Pulse 62   Ht 6' 2 (1.88 m)   Wt 283 lb (128.4 kg)   SpO2 93%   BMI 36.34 kg/m    Wt Readings from Last 3 Encounters:  12/19/23 283 lb (128.4 kg)  10/10/23 280 lb (127 kg)  03/07/23 283 lb (128.4 kg)    Gen: no distress, morbid obesity   Neck: No JVD  Cardiac: No Rubs or Gallops, no Murmur, RRR +2 radial pulses Respiratory: Clear to auscultation bilaterally, normal effort, normal  respiratory rate GI: Soft, nontender, non-distended  MS: No  edema;  moves all extremities Integument: Skin feels warm Neuro:  At time of evaluation, alert and oriented to person/place/time/situation  Psych: Normal affect, patient feels ok   ASSESSMENT AND PLAN: .    An EKG was ordered for CAD and shows sinus arrhythmia  Paroxysmal supraventricular tachycardia Asymptomatic paroxysmal supraventricular tachycardia with exercise-induced palpitations, possibly exertional tachycardia related to weight.    Coronary artery calcifications - Coronary artery calcifications present but no symptoms of coronary artery disease. Current therapy is effective with LDL at goal  and no symptoms (mild disease with prox LAD FFR normal)  Mild aortic dilation Mild aortic dilation noted on CT imaging, stable since last measurement in 2024. - Repeat gated CT aorta in one year then follow up with me  Hypertension Hypertension is well controlled with current therapy. Blood pressure is stable and  well managed.  Hyperlipidemia Hyperlipidemia is well controlled with an LDL under 55, achieved with the combination of rosuvastatin   Morbid obesity Morbid obesity with associated orthopedic issues. He is optimistic about weight loss improving his condition. - Start Zepbound  for weight loss after his [place] Trip  Stanly Leavens, MD FASE [facility] Cardiologist [facility]  [address] [place], [address] [place], [place] [address] [phone]  5:45 PM

## 2023-12-19 NOTE — Patient Instructions (Signed)
 Medication Instructions:  The current medical regimen is effective;  continue present plan and medications.  *If you need a refill on your cardiac medications before your next appointment, please call your pharmacy*  Testing/Procedures: CT Angiography (CTA) of aorta, is a special type of CT scan that uses a computer to produce multi-dimensional views of major blood vessels throughout the body. In CT angiography, a contrast material is injected through an IV to help visualize the blood vessels This will be due in 1 year.  You will be contacted at a later date to be scheduled.  Follow-Up: At [facility], you and your health needs are our priority.  As part of our continuing mission to provide you with exceptional heart care, our providers are all part of one team.  This team includes your primary Cardiologist (physician) and Advanced Practice Providers or APPs (Physician Assistants and Nurse Practitioners) who all work together to provide you with the care you need, when you need it.  Your next appointment:   1 year(s)  Provider:   Stanly DELENA Leavens, MD    We recommend signing up for the patient portal called MyChart.  Sign up information is provided on this After Visit Summary.  MyChart is used to connect with patients for Virtual Visits (Telemedicine).  Patients are able to view lab/test results, encounter notes, upcoming appointments, etc.  Non-urgent messages can be sent to your provider as well.   To learn more about what you can do with MyChart, go to [email or web address].

## 2024-01-15 ENCOUNTER — Encounter: Payer: Self-pay | Admitting: Family Medicine

## 2024-01-16 MED ORDER — TIRZEPATIDE-WEIGHT MANAGEMENT 2.5 MG/0.5ML ~~LOC~~ SOLN
2.5000 mg | SUBCUTANEOUS | 5 refills | Status: DC
Start: 2024-01-16 — End: 2024-04-11

## 2024-01-16 NOTE — Telephone Encounter (Signed)
Andrew Wilkins

## 2024-02-24 ENCOUNTER — Encounter: Payer: Self-pay | Admitting: Family Medicine

## 2024-04-11 ENCOUNTER — Ambulatory Visit: Payer: Self-pay | Admitting: Family Medicine

## 2024-04-11 ENCOUNTER — Encounter: Payer: Self-pay | Admitting: Family Medicine

## 2024-04-11 ENCOUNTER — Ambulatory Visit (INDEPENDENT_AMBULATORY_CARE_PROVIDER_SITE_OTHER): Admitting: Family Medicine

## 2024-04-11 VITALS — BP 120/68 | HR 76 | Temp 97.5°F | Ht 74.0 in | Wt 263.2 lb

## 2024-04-11 DIAGNOSIS — Z23 Encounter for immunization: Secondary | ICD-10-CM | POA: Diagnosis not present

## 2024-04-11 DIAGNOSIS — Z125 Encounter for screening for malignant neoplasm of prostate: Secondary | ICD-10-CM

## 2024-04-11 DIAGNOSIS — Z0000 Encounter for general adult medical examination without abnormal findings: Secondary | ICD-10-CM | POA: Diagnosis not present

## 2024-04-11 DIAGNOSIS — I10 Essential (primary) hypertension: Secondary | ICD-10-CM

## 2024-04-11 DIAGNOSIS — R972 Elevated prostate specific antigen [PSA]: Secondary | ICD-10-CM

## 2024-04-11 DIAGNOSIS — Z131 Encounter for screening for diabetes mellitus: Secondary | ICD-10-CM

## 2024-04-11 DIAGNOSIS — E669 Obesity, unspecified: Secondary | ICD-10-CM | POA: Diagnosis not present

## 2024-04-11 DIAGNOSIS — E782 Mixed hyperlipidemia: Secondary | ICD-10-CM | POA: Diagnosis not present

## 2024-04-11 LAB — CBC WITH DIFFERENTIAL/PLATELET
Basophils Absolute: 0 K/uL (ref 0.0–0.1)
Basophils Relative: 0.8 % (ref 0.0–3.0)
Eosinophils Absolute: 0.1 K/uL (ref 0.0–0.7)
Eosinophils Relative: 1.9 % (ref 0.0–5.0)
HCT: 45 % (ref 39.0–52.0)
Hemoglobin: 15.1 g/dL (ref 13.0–17.0)
Lymphocytes Relative: 19 % (ref 12.0–46.0)
Lymphs Abs: 1 K/uL (ref 0.7–4.0)
MCHC: 33.7 g/dL (ref 30.0–36.0)
MCV: 87 fl (ref 78.0–100.0)
Monocytes Absolute: 0.3 K/uL (ref 0.1–1.0)
Monocytes Relative: 5.2 % (ref 3.0–12.0)
Neutro Abs: 3.8 K/uL (ref 1.4–7.7)
Neutrophils Relative %: 73.1 % (ref 43.0–77.0)
Platelets: 209 K/uL (ref 150.0–400.0)
RBC: 5.17 Mil/uL (ref 4.22–5.81)
RDW: 13.5 % (ref 11.5–15.5)
WBC: 5.1 K/uL (ref 4.0–10.5)

## 2024-04-11 LAB — COMPREHENSIVE METABOLIC PANEL WITH GFR
ALT: 31 U/L (ref 0–53)
AST: 20 U/L (ref 0–37)
Albumin: 4.7 g/dL (ref 3.5–5.2)
Alkaline Phosphatase: 81 U/L (ref 39–117)
BUN: 22 mg/dL (ref 6–23)
CO2: 31 meq/L (ref 19–32)
Calcium: 9.7 mg/dL (ref 8.4–10.5)
Chloride: 101 meq/L (ref 96–112)
Creatinine, Ser: 0.82 mg/dL (ref 0.40–1.50)
GFR: 96.36 mL/min (ref >60.00)
Glucose, Bld: 86 mg/dL (ref 70–99)
Potassium: 4.7 meq/L (ref 3.5–5.1)
Sodium: 139 meq/L (ref 135–145)
Total Bilirubin: 0.6 mg/dL (ref 0.2–1.2)
Total Protein: 6.9 g/dL (ref 6.0–8.3)

## 2024-04-11 LAB — LIPID PANEL
Cholesterol: 98 mg/dL (ref 0–200)
HDL: 34.2 mg/dL — ABNORMAL LOW (ref >39.00)
LDL Cholesterol: 51 mg/dL (ref 0–99)
NonHDL: 63.55
Total CHOL/HDL Ratio: 3
Triglycerides: 65 mg/dL (ref 0.0–149.0)
VLDL: 13 mg/dL (ref 0.0–40.0)

## 2024-04-11 LAB — HEMOGLOBIN A1C: Hgb A1c MFr Bld: 5.6 % (ref 4.6–6.5)

## 2024-04-11 MED ORDER — TIRZEPATIDE-WEIGHT MANAGEMENT 5 MG/0.5ML ~~LOC~~ SOLN
5.0000 mg | SUBCUTANEOUS | 5 refills | Status: AC
Start: 2024-04-11 — End: ?

## 2024-04-11 NOTE — Patient Instructions (Addendum)
 Please stop by lab before you go If you have mychart- we will send your results within 3 business days of us  receiving them.  If you do not have mychart- we will call you about results within 5 business days of us  receiving them.  *please also note that you will see labs on mychart as soon as they post. I will later go in and write notes on them- will say notes from Dr. Katrinka   No changes today unless labs lead us  to make changes  Recommended follow up: Return in about 6 months (around 10/09/2024) for followup or sooner if needed.Schedule b4 you leave.

## 2024-04-11 NOTE — Progress Notes (Signed)
 Phone: [phone]   Subjective:  Patient presents today for their annual physical. Chief complaint-noted.   See problem oriented charting- ROS- full  review of systems was completed and negative  except for topics noted under acute/chronic concerns  The following were reviewed and entered/updated in epic: Past Medical History:  Diagnosis Date   Allergy    Arthritis    Asthma    Juvenile   BPH (benign prostatic hyperplasia)    GERD (gastroesophageal reflux disease)    Hyperlipidemia    Hypertension    Kidney stone    Plantar fasciitis    cortisone injection   Patient Active Problem List   Diagnosis Date Noted   Thoracic aortic aneurysm without rupture, unspecified part     Priority: High   Hepatic steatosis 11/03/2020    Priority: Medium    History of melanoma in situ 08/02/2016    Priority: Medium    Hyperglycemia 10/05/2015    Priority: Medium    BPH (benign prostatic hyperplasia) 04/02/2014    Priority: Medium    Family history of peripheral vascular disease 03/17/2011    Priority: Medium    Lipoma of back 07/08/2009    Priority: Medium    Hyperlipidemia 02/26/2007    Priority: Medium    Essential hypertension 02/26/2007    Priority: Medium    Osteoarthritis 02/26/2007    Priority: Medium    Status post bilateral hip replacements 12/04/2015    Priority: Low   Hemangioma of liver 04/02/2014    Priority: Low   Nephrolithiasis 04/02/2014    Priority: Low   GERD 06/18/2007    Priority: Low   INSOMNIA, PERSISTENT 02/26/2007    Priority: Low   Family history of aortic aneurysm 07/14/2020    Priority: 1.   Palpitations 03/05/2021   Pulmonary nodules 11/03/2020   Past Surgical History:  Procedure Laterality Date   BILATERAL ANTERIOR TOTAL HIP ARTHROPLASTY Bilateral 12/04/2015   Procedure: BILATERAL ANTERIOR APPROACH TOTAL HIP ARTHROPLASTY;  Surgeon: Lonni CINDERELLA Poli, MD;  Location: [facility];  Service: Orthopedics;  Laterality: Bilateral;    COLONOSCOPY     EYE SURGERY  1970   Squint surgery   JOINT REPLACEMENT     Bilateral hips   left eye surgery     left eye surgery-squint surgery-age 58   none     TONSILLECTOMY     age 75    Family History  Problem Relation Age of Onset   Cancer Mother    Hyperlipidemia Mother    Hypertension Mother    Lung cancer Father        former smoker   Heart disease Father 23       PAD (carotid endarterectomy at 55) and CAD with MI in late 49s early 37s.    Cancer Father    Hearing loss Father    Hyperlipidemia Father    Hypertension Father    Obesity Father    Heart disease Sister 44       mi, smoker   Diabetes Sister    Hyperlipidemia Sister    Hypertension Sister    Obesity Sister    Hyperlipidemia Sister    Hypertension Sister    Stroke Brother 62       physician who stopped practicing as result. massive stroke, history of a fib and metabolic syndrome, hyperlipidemia   ADD / ADHD Brother    Diabetes Brother    Early death Brother    Hypertension Brother    Obesity Brother  Crohn's disease Daughter    Stroke Paternal Grandfather 73   Colon cancer Neg Hx    Liver cancer Neg Hx    Rectal cancer Neg Hx    Stomach cancer Neg Hx    Pancreatic cancer Neg Hx     Medications- reviewed and updated Current Outpatient Medications  Medication Sig Dispense Refill   acetaminophen  (TYLENOL ) 500 MG tablet Take 1,000 mg by mouth daily as needed.      aspirin  EC 81 MG tablet Take 81 mg by mouth daily.     azelastine  (ASTELIN ) 0.1 % nasal spray Use 1 spray in each nostril daily as needed. 30 mL 5   diclofenac  (VOLTAREN ) 75 MG EC tablet Take 75 mg by mouth 2 (two) times daily as needed.     fish oil-omega-3 fatty acids 1000 MG capsule Take 2 g by mouth daily.     folic acid  (FOLVITE ) 1 MG tablet TAKE 1 TABLET BY MOUTH DAILY 90 tablet 3   HYDROcodone -acetaminophen  (NORCO/VICODIN) 5-325 MG tablet Take 1 tablet by mouth every 6 (six) hours as needed for moderate pain or severe pain  (kidney stones). 5 tablet 0   losartan -hydrochlorothiazide  (HYZAAR) 50-12.5 MG tablet TAKE 1 TABLET BY MOUTH DAILY 90 tablet 3   Multiple Vitamin (MULTIVITAMIN WITH MINERALS) TABS tablet Take 1 tablet by mouth daily.     RABEprazole  (ACIPHEX ) 20 MG tablet TAKE 1 TABLET BY MOUTH DAILY 90 tablet 3   rosuvastatin  (CRESTOR ) 10 MG tablet TAKE 1 TABLET BY MOUTH DAILY 90 tablet 3   silodosin (RAPAFLO) 8 MG CAPS capsule Take 8 mg by mouth daily.     tirzepatide  5 MG/0.5ML injection vial Inject 5 mg into the skin once a week. 2 mL 5   No current facility-administered medications for this visit.    Allergies-reviewed and updated No Known Allergies  Social History   Social History Narrative   Married 1990. 3 kids- 53-94 years old in 2024. Oldest Son teacher, music (2nd masters in [place] in 2024)- 1st granddaughter in [place] in 2025 .  Daughter in [place]. Son - coaching in [place] lacrosse- team doing great      Works [employer], journalist, newspaper, country engineer, manufacturing   Background as a paramedic   Works at least 60 hours a week      Hobbies: time with dog, outdoors person, renovating/home repair   Objective  Objective:  BP 120/68 (BP Location: Left Arm, Patient Position: Sitting, Cuff Size: Normal)   Pulse 76   Temp (!) 97.5 F (36.4 C) (Temporal)   Ht 6' 2 (1.88 m)   Wt 263 lb 3.2 oz (119.4 kg)   SpO2 97%   BMI 33.79 kg/m  Gen: NAD, resting comfortably HEENT: Mucous membranes are moist. Oropharynx normal Neck: no thyromegaly CV: RRR no murmurs rubs or gallops Lungs: CTAB no crackles, wheeze, rhonchi Abdomen: soft/nontender/nondistended/normal bowel sounds. No rebound or guarding.  Ext: no edema Skin: warm, dry Neuro: grossly normal, moves all extremities, PERRLA   Assessment and Plan  58 y.o. male presenting for annual physical.  Health Maintenance counseling: 1. Anticipatory guidance: Patient counseled regarding regular dental exams -q6 months,  eye exams - Dr. Waylan every other yearly,  avoiding smoking and second hand smoke , limiting alcohol to 2 beverages per day - 2 a month or less, no illicit drugs .   2. Risk factor reduction:  Advised patient of need for regular exercise and diet rich and fruits and vegetables to reduce  risk of heart attack and stroke.  Exercise- 3 days a week with dedicated workout including lifting weights 2 days a week- working on functional movements. Average around 89999 steps a day- weekends 20k steps.  Diet/weight management-down 17 lbs but stable over last 6 weeks. We will bump to the 5 mg dose up from 2.5 mg but congratulated on progress. Eating well. Did have some gastroenterology effects for first 2 weeks but did better.  Wt Readings from Last 3 Encounters:  04/11/24 263 lb 3.2 oz (119.4 kg)  12/19/23 283 lb (128.4 kg)  10/10/23 280 lb (127 kg)  3. Immunizations/screenings/ancillary studies- shingrix  today but getting flu shot at work. Holding off on covid Immunization History  Administered Date(s) Administered   INFLUENZA, HIGH DOSE SEASONAL PF 02/10/2023   Influenza Whole 04/10/2009, 02/22/2019   Influenza,inj,Quad PF,6+ Mos 03/29/2013, 02/18/2022   Influenza-Unspecified 04/04/2014, 03/23/2015, 02/19/2016, 02/14/2017, 02/13/2018, 02/18/2020, 02/12/2021   Moderna Covid-19 Vaccine Bivalent Booster 42yrs & up 04/01/2021   Moderna Sars-Covid-2 Vaccination 05/29/2019, 06/26/2019, 04/09/2020   PNEUMOCOCCAL CONJUGATE-20 10/10/2023   PPD Test 04/04/2014   Td 04/06/2006   Tdap 08/02/2016   Zoster Recombinant(Shingrix ) 10/10/2023, 04/11/2024  4. Prostate cancer screening- PSA monitoring with urology - has upcoming visit  Lab Results  Component Value Date   PSA 15.83 (H) 07/20/2022   PSA 11.61 (H) 01/10/2022   PSA 11.01 (H) 07/06/2021   5. Colon cancer screening - 03/24/23 with 2 year repeat planned 6. Skin cancer screening- Dr. Lynnell q6 months. advised regular sunscreen use. Denies worrisome,  changing, or new skin lesions.  7. Smoking associated screening (lung cancer screening, AAA screen 65-75, UA)- never smoker- family history lung cancer hut has had CTs - both parents smoked 8. STD screening - only active with wife  Status of chronic or acute concerns   #Social update--new grandbaby in [place]  #odd rash- saw Dr. Maple and worked through it with steroid creams  #left shoulder pain- plans to see orthopedic when has time. Worse with sleep. Still abel to lift but has to be careful  # Nonobstructive coronary disease diagnosed over 2024-25-49% PDA proximal lesion with less than 25% mid RCA lesion with no significant stenosis by FFR. #Hyperlipidemia S:Compliant with Crestor  10 mg.  Strong family history of cardiovascular disease.  We have opted to continue aspirin  as result. -Lipoprotein a not elevated  -no chest pain or shortness of breath  Lab Results  Component Value Date   CHOL 96 10/10/2023   HDL 29.60 (L) 10/10/2023   LDLCALC 47 10/10/2023   LDLDIRECT 59.0 08/14/2019   TRIG 98.0 10/10/2023   CHOLHDL 3 10/10/2023  A/P:  lipids have looked great- hed like to repeat today- LDL at 47 Coronary artery disease asymptomatic - continue current medications including aspirin     #Essential hypertension S: Compliant with losartan  50 mg-hydrochlorothiazide  12.5 mg BP Readings from Last 3 Encounters:  04/11/24 120/68  12/19/23 128/78  10/10/23 127/80  A/P: well controlled continue current medications    #Thoracic aortic widening S: From CT angiogram on 08/10/2021 with cardiology Dr. Santo Unchanged mild aneurysm of the ascending thoracic aorta measuring up to 4.1 cm -worse on echo 2024--repeat CT angio 08/02/2022 showed stability of 4.1 cm - Avoid quinolones -mom had AAA_ last imaging 10/24/20  A/P: continue close follow up with cardiology- trying to space to every other year    #Nephrolithiasis S: Patient has a few hydrocodone  on hand in case has acute flareup  to keep him out of emergency room.  A/P: did have one stone in last year- they hanndled in office plus rapaflo likely helped  % BPH-follows with urology with elevated PSA S: Patient compliant with alfuzosin --> rapaflo with improvement MRI dec 2020 with PSA over 10 reassuring.  Has had biopsies in the past as well A/P:in general doing well. Occasional obstruction once a month- self caths and resolves  #Hyperglycemia S: Intermittent mild A1c elevations in the low fives.  Lab Results  Component Value Date   HGBA1C 5.8 10/10/2023   HGBA1C 5.8 07/20/2022   HGBA1C 5.9 01/10/2022  A/P: hopefully improved- update a1c with labs   #GERD S: Compliant with AcipHex .  He is on oral B12. Has not done well with h2 blocker trials in past A/P: reasonable control- continue current medications  - interested to see if he can cut back as continues to lose weight  #Fatty liver noted on prior imaging-- on Zepbound  currently- plus losing weight   Recommended follow up: No follow-ups on file.  Lab/Order associations: fasting   ICD-10-CM   1. Preventative health care  Z00.00     2. Immunization due  Z23 Zoster Recombinant (Shingrix  )    3. Essential hypertension  I10 Comprehensive metabolic panel with GFR    CBC with Differential/Platelet    Lipid panel    4. Mixed hyperlipidemia  E78.2 Comprehensive metabolic panel with GFR    CBC with Differential/Platelet    Lipid panel    5. Screening for diabetes mellitus  Z13.1 Hemoglobin A1c    6. Obesity (BMI 30-39.9)  E66.9 Hemoglobin A1c    7. Elevated PSA  R97.20     8. Screening for prostate cancer  Z12.5       Meds ordered this encounter  Medications   tirzepatide  5 MG/0.5ML injection vial    Sig: Inject 5 mg into the skin once a week.    Dispense:  2 mL    Refill:  5    Return precautions advised.  Garnette Lukes, MD

## 2024-04-24 ENCOUNTER — Other Ambulatory Visit: Payer: Self-pay | Admitting: Urology

## 2024-04-24 DIAGNOSIS — R972 Elevated prostate specific antigen [PSA]: Secondary | ICD-10-CM

## 2024-05-16 ENCOUNTER — Encounter: Payer: Self-pay | Admitting: Family Medicine

## 2024-06-05 ENCOUNTER — Ambulatory Visit
Admission: RE | Admit: 2024-06-05 | Discharge: 2024-06-05 | Disposition: A | Discharge status: Home / Self Care | Source: Ambulatory Visit | Attending: Urology | Admitting: Urology

## 2024-06-05 DIAGNOSIS — R972 Elevated prostate specific antigen [PSA]: Secondary | ICD-10-CM

## 2024-06-13 ENCOUNTER — Other Ambulatory Visit (HOSPITAL_COMMUNITY): Payer: Self-pay | Admitting: Urology

## 2024-06-13 DIAGNOSIS — R972 Elevated prostate specific antigen [PSA]: Secondary | ICD-10-CM

## 2024-06-24 ENCOUNTER — Ambulatory Visit (HOSPITAL_COMMUNITY)
Admission: RE | Admit: 2024-06-24 | Discharge: 2024-06-24 | Disposition: A | Source: Ambulatory Visit | Attending: Urology | Admitting: Urology

## 2024-06-24 DIAGNOSIS — R972 Elevated prostate specific antigen [PSA]: Secondary | ICD-10-CM | POA: Insufficient documentation

## 2024-06-24 MED ORDER — GADOBUTROL 1 MMOL/ML IV SOLN
10.0000 mL | Freq: Once | INTRAVENOUS | Status: AC | PRN
  Administered 2024-06-24: 10 mL via INTRAVENOUS

## 2024-10-09 ENCOUNTER — Ambulatory Visit: Admitting: Family Medicine

## 2025-04-14 ENCOUNTER — Encounter: Admitting: Family Medicine
